# Patient Record
Sex: Female | Born: 1960 | Race: White | Hispanic: No | Smoking: Former smoker
Health system: Southern US, Community
[De-identification: ages and names within clinical notes are randomized; demographics above are authoritative.]

## PROBLEM LIST (undated history)

## (undated) ENCOUNTER — Emergency Department (HOSPITAL_COMMUNITY): Admission: EM | Payer: Self-pay

## (undated) DIAGNOSIS — I10 Essential (primary) hypertension: Secondary | ICD-10-CM

## (undated) DIAGNOSIS — K56609 Unspecified intestinal obstruction, unspecified as to partial versus complete obstruction: Secondary | ICD-10-CM

## (undated) DIAGNOSIS — F1190 Opioid use, unspecified, uncomplicated: Secondary | ICD-10-CM

## (undated) DIAGNOSIS — E039 Hypothyroidism, unspecified: Secondary | ICD-10-CM

## (undated) DIAGNOSIS — M1990 Unspecified osteoarthritis, unspecified site: Secondary | ICD-10-CM

## (undated) DIAGNOSIS — K08109 Complete loss of teeth, unspecified cause, unspecified class: Secondary | ICD-10-CM

## (undated) DIAGNOSIS — M869 Osteomyelitis, unspecified: Secondary | ICD-10-CM

## (undated) DIAGNOSIS — E119 Type 2 diabetes mellitus without complications: Secondary | ICD-10-CM

## (undated) DIAGNOSIS — M0600 Rheumatoid arthritis without rheumatoid factor, unspecified site: Secondary | ICD-10-CM

## (undated) DIAGNOSIS — E079 Disorder of thyroid, unspecified: Secondary | ICD-10-CM

## (undated) DIAGNOSIS — Z8659 Personal history of other mental and behavioral disorders: Secondary | ICD-10-CM

## (undated) DIAGNOSIS — Z972 Presence of dental prosthetic device (complete) (partial): Secondary | ICD-10-CM

## (undated) DIAGNOSIS — G629 Polyneuropathy, unspecified: Secondary | ICD-10-CM

## (undated) DIAGNOSIS — G8929 Other chronic pain: Secondary | ICD-10-CM

## (undated) DIAGNOSIS — K7460 Unspecified cirrhosis of liver: Secondary | ICD-10-CM

## (undated) DIAGNOSIS — F1410 Cocaine abuse, uncomplicated: Secondary | ICD-10-CM

## (undated) DIAGNOSIS — F1210 Cannabis abuse, uncomplicated: Secondary | ICD-10-CM

## (undated) HISTORY — PX: TONSILLECTOMY: SUR1361

## (undated) HISTORY — DX: Type 2 diabetes mellitus without complications: E11.9

## (undated) HISTORY — PX: TOTAL KNEE ARTHROPLASTY: SHX125

## (undated) HISTORY — DX: Unspecified intestinal obstruction, unspecified as to partial versus complete obstruction: K56.609

## (undated) HISTORY — PX: JOINT REPLACEMENT: SHX530

## (undated) HISTORY — PX: ABDOMINAL HYSTERECTOMY: SHX81

## (undated) HISTORY — DX: Unspecified cirrhosis of liver: K74.60

## (undated) HISTORY — PX: COLON SURGERY: SHX602

## (undated) HISTORY — PX: BREAST REDUCTION SURGERY: SHX8

## (undated) HISTORY — PX: SMALL INTESTINE SURGERY: SHX150

## (undated) HISTORY — PX: KNEE SURGERY: SHX244

---

## 1987-06-24 HISTORY — PX: BREAST REDUCTION SURGERY: SHX8

## 1998-05-25 ENCOUNTER — Inpatient Hospital Stay (HOSPITAL_COMMUNITY): Admission: AD | Admit: 1998-05-25 | Discharge: 1998-05-25 | Payer: Self-pay | Admitting: *Deleted

## 1998-06-07 ENCOUNTER — Ambulatory Visit (HOSPITAL_COMMUNITY): Admission: RE | Admit: 1998-06-07 | Discharge: 1998-06-07 | Payer: Self-pay | Admitting: Obstetrics

## 1998-06-07 ENCOUNTER — Encounter: Admission: RE | Admit: 1998-06-07 | Discharge: 1998-06-07 | Payer: Self-pay | Admitting: Obstetrics

## 1998-06-07 ENCOUNTER — Encounter: Payer: Self-pay | Admitting: Obstetrics

## 1998-06-20 ENCOUNTER — Encounter: Admission: RE | Admit: 1998-06-20 | Discharge: 1998-06-20 | Payer: Self-pay | Admitting: Obstetrics & Gynecology

## 1998-06-20 ENCOUNTER — Ambulatory Visit (HOSPITAL_COMMUNITY): Admission: RE | Admit: 1998-06-20 | Discharge: 1998-06-20 | Payer: Self-pay | Admitting: Obstetrics & Gynecology

## 1998-06-20 ENCOUNTER — Encounter: Payer: Self-pay | Admitting: Obstetrics & Gynecology

## 1998-06-26 ENCOUNTER — Ambulatory Visit (HOSPITAL_COMMUNITY): Admission: RE | Admit: 1998-06-26 | Discharge: 1998-06-26 | Payer: Self-pay | Admitting: Obstetrics & Gynecology

## 1998-06-26 ENCOUNTER — Encounter: Admission: RE | Admit: 1998-06-26 | Discharge: 1998-06-26 | Payer: Self-pay | Admitting: Obstetrics & Gynecology

## 1998-06-27 ENCOUNTER — Ambulatory Visit (HOSPITAL_COMMUNITY): Admission: RE | Admit: 1998-06-27 | Discharge: 1998-06-27 | Payer: Self-pay | Admitting: Obstetrics & Gynecology

## 1998-06-27 ENCOUNTER — Encounter: Payer: Self-pay | Admitting: Obstetrics & Gynecology

## 1999-01-30 ENCOUNTER — Encounter: Admission: RE | Admit: 1999-01-30 | Discharge: 1999-04-30 | Payer: Self-pay | Admitting: Family Medicine

## 2003-06-24 HISTORY — PX: TOTAL VAGINAL HYSTERECTOMY: SHX2548

## 2004-06-23 DIAGNOSIS — Z8719 Personal history of other diseases of the digestive system: Secondary | ICD-10-CM

## 2004-06-23 HISTORY — DX: Personal history of other diseases of the digestive system: Z87.19

## 2004-06-23 HISTORY — PX: COLON SURGERY: SHX602

## 2004-08-17 ENCOUNTER — Ambulatory Visit: Payer: Self-pay | Admitting: Internal Medicine

## 2005-01-10 ENCOUNTER — Emergency Department: Payer: Self-pay | Admitting: Emergency Medicine

## 2005-01-13 ENCOUNTER — Ambulatory Visit: Payer: Self-pay | Admitting: Emergency Medicine

## 2005-01-24 ENCOUNTER — Ambulatory Visit: Payer: Self-pay | Admitting: Unknown Physician Specialty

## 2005-04-09 ENCOUNTER — Ambulatory Visit: Payer: Self-pay | Admitting: Family Medicine

## 2005-04-15 ENCOUNTER — Ambulatory Visit: Payer: Self-pay | Admitting: Family Medicine

## 2006-08-26 ENCOUNTER — Ambulatory Visit: Payer: Self-pay | Admitting: Family Medicine

## 2006-08-31 ENCOUNTER — Ambulatory Visit: Payer: Self-pay | Admitting: Family Medicine

## 2006-08-31 LAB — CONVERTED CEMR LAB
Basophils Absolute: 0 10*3/uL (ref 0.0–0.1)
Eosinophils Absolute: 0.1 10*3/uL (ref 0.0–0.6)
Eosinophils Relative: 1.6 % (ref 0.0–5.0)
Lymphocytes Relative: 22.9 % (ref 12.0–46.0)
MCV: 102.7 fL — ABNORMAL HIGH (ref 78.0–100.0)
Monocytes Relative: 4.1 % (ref 3.0–11.0)
Neutro Abs: 5.2 10*3/uL (ref 1.4–7.7)
Platelets: 194 10*3/uL (ref 150–400)
RBC: 3.72 M/uL — ABNORMAL LOW (ref 3.87–5.11)
WBC: 7.3 10*3/uL (ref 4.5–10.5)

## 2007-06-30 ENCOUNTER — Ambulatory Visit: Payer: Self-pay | Admitting: Family Medicine

## 2007-06-30 DIAGNOSIS — R062 Wheezing: Secondary | ICD-10-CM

## 2008-06-23 DIAGNOSIS — Z8711 Personal history of peptic ulcer disease: Secondary | ICD-10-CM

## 2008-06-23 DIAGNOSIS — K7460 Unspecified cirrhosis of liver: Secondary | ICD-10-CM

## 2008-06-23 HISTORY — DX: Unspecified cirrhosis of liver: K74.60

## 2008-06-23 HISTORY — DX: Personal history of peptic ulcer disease: Z87.11

## 2008-12-22 ENCOUNTER — Emergency Department (HOSPITAL_COMMUNITY): Admission: EM | Admit: 2008-12-22 | Discharge: 2008-12-22 | Payer: Self-pay | Admitting: Family Medicine

## 2009-01-27 ENCOUNTER — Inpatient Hospital Stay (HOSPITAL_COMMUNITY): Admission: EM | Admit: 2009-01-27 | Discharge: 2009-01-31 | Payer: Self-pay | Admitting: Emergency Medicine

## 2009-01-30 ENCOUNTER — Encounter (INDEPENDENT_AMBULATORY_CARE_PROVIDER_SITE_OTHER): Payer: Self-pay | Admitting: Emergency Medicine

## 2009-01-30 ENCOUNTER — Ambulatory Visit: Payer: Self-pay | Admitting: Gastroenterology

## 2009-01-30 ENCOUNTER — Telehealth (INDEPENDENT_AMBULATORY_CARE_PROVIDER_SITE_OTHER): Payer: Self-pay | Admitting: *Deleted

## 2009-01-30 DIAGNOSIS — R933 Abnormal findings on diagnostic imaging of other parts of digestive tract: Secondary | ICD-10-CM

## 2009-01-30 HISTORY — PX: ESOPHAGOGASTRODUODENOSCOPY: SHX1529

## 2009-02-27 ENCOUNTER — Ambulatory Visit (HOSPITAL_COMMUNITY): Admission: RE | Admit: 2009-02-27 | Discharge: 2009-02-27 | Payer: Self-pay | Admitting: Gastroenterology

## 2009-03-12 ENCOUNTER — Ambulatory Visit: Payer: Self-pay | Admitting: Family Medicine

## 2009-04-03 ENCOUNTER — Telehealth (INDEPENDENT_AMBULATORY_CARE_PROVIDER_SITE_OTHER): Payer: Self-pay | Admitting: *Deleted

## 2009-04-30 ENCOUNTER — Ambulatory Visit: Payer: Self-pay | Admitting: Internal Medicine

## 2009-04-30 ENCOUNTER — Encounter: Payer: Self-pay | Admitting: Family Medicine

## 2009-04-30 LAB — CONVERTED CEMR LAB
ALT: 25 units/L (ref 0–35)
AST: 51 units/L — ABNORMAL HIGH (ref 0–37)
Barbiturate Quant, Ur: NEGATIVE
Benzodiazepines.: POSITIVE — AB
CO2: 25 meq/L (ref 19–32)
Calcium: 8.9 mg/dL (ref 8.4–10.5)
Chloride: 100 meq/L (ref 96–112)
Cholesterol: 180 mg/dL (ref 0–200)
Cocaine Metabolites: NEGATIVE
Creatinine, Ser: 0.78 mg/dL (ref 0.40–1.20)
Creatinine,U: 148.5 mg/dL
Eosinophils Absolute: 0.1 10*3/uL (ref 0.0–0.7)
Lymphocytes Relative: 15 % (ref 12–46)
Lymphs Abs: 1.9 10*3/uL (ref 0.7–4.0)
MCV: 103.6 fL — ABNORMAL HIGH (ref 78.0–100.0)
Methadone: NEGATIVE
Monocytes Relative: 4 % (ref 3–12)
Neutro Abs: 9.9 10*3/uL — ABNORMAL HIGH (ref 1.7–7.7)
Neutrophils Relative %: 80 % — ABNORMAL HIGH (ref 43–77)
Platelets: 181 10*3/uL (ref 150–400)
Potassium: 3.8 meq/L (ref 3.5–5.3)
RBC: 4.42 M/uL (ref 3.87–5.11)
Sodium: 139 meq/L (ref 135–145)
TSH: 0.797 microintl units/mL (ref 0.350–4.500)
Total CHOL/HDL Ratio: 7.2
Total Protein: 6.9 g/dL (ref 6.0–8.3)
Vit D, 25-Hydroxy: 8 ng/mL — ABNORMAL LOW (ref 30–89)
Vitamin B-12: 1173 pg/mL — ABNORMAL HIGH (ref 211–911)
WBC: 12.4 10*3/uL — ABNORMAL HIGH (ref 4.0–10.5)

## 2009-05-07 ENCOUNTER — Ambulatory Visit: Payer: Self-pay | Admitting: Internal Medicine

## 2010-09-28 LAB — COMPREHENSIVE METABOLIC PANEL WITH GFR
ALT: 17 U/L (ref 0–35)
AST: 69 U/L — ABNORMAL HIGH (ref 0–37)
Albumin: 3.8 g/dL (ref 3.5–5.2)
Alkaline Phosphatase: 144 U/L — ABNORMAL HIGH (ref 39–117)
BUN: 13 mg/dL (ref 6–23)
CO2: 30 meq/L (ref 19–32)
Calcium: 9.6 mg/dL (ref 8.4–10.5)
Chloride: 87 meq/L — ABNORMAL LOW (ref 96–112)
Creatinine, Ser: 0.96 mg/dL (ref 0.4–1.2)
GFR calc non Af Amer: >60 mL/min
Glucose, Bld: 132 mg/dL — ABNORMAL HIGH (ref 70–99)
Potassium: 3.2 meq/L — ABNORMAL LOW (ref 3.5–5.1)
Sodium: 134 meq/L — ABNORMAL LOW (ref 135–145)
Total Bilirubin: 2.7 mg/dL — ABNORMAL HIGH (ref 0.3–1.2)
Total Protein: 6.9 g/dL (ref 6.0–8.3)

## 2010-09-28 LAB — URINE MICROSCOPIC-ADD ON

## 2010-09-28 LAB — COMPREHENSIVE METABOLIC PANEL
AST: 44 U/L — ABNORMAL HIGH (ref 0–37)
AST: 49 U/L — ABNORMAL HIGH (ref 0–37)
Albumin: 3.2 g/dL — ABNORMAL LOW (ref 3.5–5.2)
Albumin: 3.2 g/dL — ABNORMAL LOW (ref 3.5–5.2)
Albumin: 3.8 g/dL (ref 3.5–5.2)
Alkaline Phosphatase: 123 U/L — ABNORMAL HIGH (ref 39–117)
BUN: 13 mg/dL (ref 6–23)
BUN: 2 mg/dL — ABNORMAL LOW (ref 6–23)
BUN: 5 mg/dL — ABNORMAL LOW (ref 6–23)
Calcium: 8.6 mg/dL (ref 8.4–10.5)
Calcium: 8.9 mg/dL (ref 8.4–10.5)
Chloride: 101 mEq/L (ref 96–112)
Chloride: 88 mEq/L — ABNORMAL LOW (ref 96–112)
Creatinine, Ser: 0.78 mg/dL (ref 0.4–1.2)
Creatinine, Ser: 0.79 mg/dL (ref 0.4–1.2)
Creatinine, Ser: 0.86 mg/dL (ref 0.4–1.2)
GFR calc Af Amer: >60 mL/min (ref >60)
GFR calc Af Amer: >60 mL/min (ref >60)
GFR calc non Af Amer: >60 mL/min (ref >60)
Glucose, Bld: 167 mg/dL — ABNORMAL HIGH (ref 70–99)
Potassium: 2.9 mEq/L — ABNORMAL LOW (ref 3.5–5.1)
Total Bilirubin: 1.1 mg/dL (ref 0.3–1.2)
Total Bilirubin: 2.1 mg/dL — ABNORMAL HIGH (ref 0.3–1.2)
Total Protein: 5.6 g/dL — ABNORMAL LOW (ref 6.0–8.3)
Total Protein: 5.8 g/dL — ABNORMAL LOW (ref 6.0–8.3)

## 2010-09-28 LAB — HEPATIC FUNCTION PANEL
ALT: 14 U/L (ref 0–35)
AST: 56 U/L — ABNORMAL HIGH (ref 0–37)
Albumin: 3.5 g/dL (ref 3.5–5.2)
Alkaline Phosphatase: 121 U/L — ABNORMAL HIGH (ref 39–117)
Bilirubin, Direct: 0.8 mg/dL — ABNORMAL HIGH (ref 0.0–0.3)
Indirect Bilirubin: 1.7 mg/dL — ABNORMAL HIGH (ref 0.3–0.9)
Total Bilirubin: 2.5 mg/dL — ABNORMAL HIGH (ref 0.3–1.2)
Total Protein: 6.2 g/dL (ref 6.0–8.3)

## 2010-09-28 LAB — URINE CULTURE

## 2010-09-28 LAB — CBC
HCT: 26.1 % — ABNORMAL LOW (ref 36.0–46.0)
HCT: 26.4 % — ABNORMAL LOW (ref 36.0–46.0)
HCT: 30.7 % — ABNORMAL LOW (ref 36.0–46.0)
HCT: 35.6 % — ABNORMAL LOW (ref 36.0–46.0)
Hemoglobin: 10.3 g/dL — ABNORMAL LOW (ref 12.0–15.0)
Hemoglobin: 12.2 g/dL (ref 12.0–15.0)
MCHC: 33.4 g/dL (ref 30.0–36.0)
MCHC: 33.6 g/dL (ref 30.0–36.0)
MCHC: 34.3 g/dL (ref 30.0–36.0)
MCV: 103.3 fL — ABNORMAL HIGH (ref 78.0–100.0)
MCV: 105.1 fL — ABNORMAL HIGH (ref 78.0–100.0)
MCV: 105.9 fL — ABNORMAL HIGH (ref 78.0–100.0)
MCV: 106 fL — ABNORMAL HIGH (ref 78.0–100.0)
Platelets: 102 10*3/uL — ABNORMAL LOW (ref 150–400)
Platelets: 125 10*3/uL — ABNORMAL LOW (ref 150–400)
Platelets: 133 10*3/uL — ABNORMAL LOW (ref 150–400)
Platelets: 144 10*3/uL — ABNORMAL LOW (ref 150–400)
RBC: 3.45 MIL/uL — ABNORMAL LOW (ref 3.87–5.11)
RDW: 17 % — ABNORMAL HIGH (ref 11.5–15.5)
RDW: 17.3 % — ABNORMAL HIGH (ref 11.5–15.5)
RDW: 17.4 % — ABNORMAL HIGH (ref 11.5–15.5)
RDW: 17.8 % — ABNORMAL HIGH (ref 11.5–15.5)
WBC: 8.2 10*3/uL (ref 4.0–10.5)
WBC: 8.3 10*3/uL (ref 4.0–10.5)
WBC: 8.9 10*3/uL (ref 4.0–10.5)

## 2010-09-28 LAB — LIPID PANEL
Cholesterol: 202 mg/dL — ABNORMAL HIGH (ref 0–200)
HDL: 59 mg/dL (ref >39)
HDL: 65 mg/dL (ref >39)
LDL Cholesterol: 91 mg/dL (ref 0–99)
Total CHOL/HDL Ratio: 3.1 RATIO
Total CHOL/HDL Ratio: 3.4 RATIO

## 2010-09-28 LAB — URINALYSIS, ROUTINE W REFLEX MICROSCOPIC
Glucose, UA: NEGATIVE mg/dL
Hgb urine dipstick: NEGATIVE
Hgb urine dipstick: NEGATIVE
Hgb urine dipstick: NEGATIVE
Nitrite: NEGATIVE
Nitrite: POSITIVE — AB
Protein, ur: NEGATIVE mg/dL
Specific Gravity, Urine: 1.011 (ref 1.005–1.030)
Specific Gravity, Urine: 1.022 (ref 1.005–1.030)
Urobilinogen, UA: 4 mg/dL — ABNORMAL HIGH (ref 0.0–1.0)
Urobilinogen, UA: >8 mg/dL — ABNORMAL HIGH (ref 0.0–1.0)

## 2010-09-28 LAB — DIFFERENTIAL
Basophils Absolute: 0 10*3/uL (ref 0.0–0.1)
Lymphocytes Relative: 13 % (ref 12–46)
Lymphs Abs: 1.2 10*3/uL (ref 0.7–4.0)
Monocytes Absolute: 0.4 10*3/uL (ref 0.1–1.0)
Monocytes Relative: 5 % (ref 3–12)
Neutro Abs: 7.2 10*3/uL (ref 1.7–7.7)

## 2010-09-28 LAB — HEMOCCULT GUIAC POC 1CARD (OFFICE): Fecal Occult Bld: POSITIVE

## 2010-09-28 LAB — HEPATITIS PANEL, ACUTE
HCV Ab: NEGATIVE
Hep A IgM: NEGATIVE
Hepatitis B Surface Ag: NEGATIVE

## 2010-09-28 LAB — CK TOTAL AND CKMB (NOT AT ARMC)
CK, MB: 1.3 ng/mL (ref 0.3–4.0)
Relative Index: INVALID (ref 0.0–2.5)
Total CK: 46 U/L (ref 7–177)

## 2010-09-28 LAB — AFP TUMOR MARKER: AFP-Tumor Marker: 11.5 ng/mL — ABNORMAL HIGH (ref 0.0–8.0)

## 2010-09-28 LAB — HEMOGLOBIN A1C
Hgb A1c MFr Bld: 6.9 % — ABNORMAL HIGH (ref 4.6–6.1)
Mean Plasma Glucose: 151 mg/dL

## 2010-09-28 LAB — FOLATE
Folate: 1.1 ng/mL
Folate: >20 ng/mL

## 2010-09-28 LAB — TROPONIN I

## 2010-09-28 LAB — RETICULOCYTES
RBC.: 2.81 MIL/uL — ABNORMAL LOW (ref 3.87–5.11)
Retic Ct Pct: 0.5 % (ref 0.4–3.1)

## 2010-09-28 LAB — POCT CARDIAC MARKERS
CKMB, poc: <1 ng/mL — ABNORMAL LOW (ref 1.0–8.0)
Myoglobin, poc: 92.4 ng/mL (ref 12–200)
Troponin i, poc: <0.05 ng/mL (ref 0.00–0.09)

## 2010-09-28 LAB — VITAMIN B12
Vitamin B-12: 1173 pg/mL — ABNORMAL HIGH (ref 211–911)
Vitamin B-12: 771 pg/mL (ref 211–911)

## 2010-09-28 LAB — PROTIME-INR
INR: 1.1 (ref 0.00–1.49)
Prothrombin Time: 14.2 s (ref 11.6–15.2)

## 2010-09-28 LAB — T4, FREE: Free T4: 1.09 ng/dL (ref 0.80–1.80)

## 2010-09-28 LAB — CARDIAC PANEL(CRET KIN+CKTOT+MB+TROPI)
Relative Index: INVALID (ref 0.0–2.5)
Total CK: 57 U/L (ref 7–177)
Troponin I: 0.01 ng/mL (ref 0.00–0.06)

## 2010-09-28 LAB — IRON AND TIBC: UIBC: 169 ug/dL

## 2010-09-28 LAB — GLUCOSE, CAPILLARY: Glucose-Capillary: 144 mg/dL — ABNORMAL HIGH (ref 70–99)

## 2010-09-28 LAB — APTT: aPTT: 27 seconds (ref 24–37)

## 2010-09-28 LAB — CULTURE, ASPIRATE-QUANTITATIVE

## 2010-09-28 LAB — BRAIN NATRIURETIC PEPTIDE: Pro B Natriuretic peptide (BNP): <30 pg/mL (ref 0.0–100.0)

## 2010-09-28 LAB — TSH: TSH: 16.392 u[IU]/mL — ABNORMAL HIGH (ref 0.350–4.500)

## 2010-11-05 NOTE — Discharge Summary (Signed)
Taylor Benitez, HOPES                ACCOUNT NO.:  [id number]   MEDICAL RECORD NO.:  [id number]          PATIENT TYPE:  INP   LOCATION:  5504                         FACILITY:  [facility]   PHYSICIAN:  Herbie Saxon, MDDATE OF BIRTH:  23-Nov-1960   DATE OF ADMISSION:  01/26/2009  DATE OF DISCHARGE:  01/31/2009                               DISCHARGE SUMMARY   DISCHARGE DIAGNOSES:  1. Chest pain, noncardiac.  2. Alcoholic liver disease.  3. Query hepatocellular carcinoma.  4. History of alcohol abuse.  5. Microcytic anemia.  6. Frequent falls, unsteady gait.  7. Occult gastrointestinal bleed.  8. Mild portal gastropathy.  9. Prepyloric ulcers.  10.Hypertension, stable.  11.Hypothyroidism.  12.Fatty liver.  13.Elevated AST.   CONSULTS:  Gastroenterology, Dr. Christella Hartigan.   PROCEDURE:  Upper endoscopy on January 30, 2009, showed mild portal  gastropathy and prepyloric ulcers.   RADIOLOGY:  The CT abdomen of January 27, 2009, shows hepatomegaly, fatty  infiltration, and hyperenhancement in the inferior right liver, query  hepatocellular carcinoma.  Abdomen ultrasound of January 27, 2009, showed  enlarged echogenic liver suggesting fatty infiltration.  Chest x-ray of  January 26, 2009, showed no acute findings.   HOSPITAL COURSE:  This 50 year old Caucasian lady presented to the  emergency room with complaint of shortness of breath and chest pressure.  The patient does admit to drinking 3 pints of bourbon on a daily basis.  On this admission, a serial cardiac enzymes and EKG were negative for  acute coronary event.  The patient was noticed to have hemoccult  positive necessitating Gastroenterology evaluation.  The upper endoscopy  findings did reveal peptic ulcer disease.  The patient has been advised  total abstinence with alcohol and to register with AA, [facility], for alcohol rehab.  Because of the abnormal findings on the  CT abdomen, the patient has been scheduled for an  MRI of the liver in  the 3-4 weeks by Gastroenterology.  The patient will also have a repeat  endoscopy in the next 2 months by Gastroenterology.  The patient was  noticed to have an steady gait, frequent falls and she is going to be  evaluated by Physical Therapy prior to being discharge home.  We will  follow Physical Therapy recommendations.   DISCHARGE CONDITION:  Stable.   DIET:  Heart-healthy, low cholesterol.   ACTIVITY:  Increased slowly as tolerated, ambulating with assistance.  The patient to follow up with her primary care physician at [facility], Internal Medicine in the next 1-2 weeks.  The patient to follow up  with Dr. Christella Hartigan, gastroenterologist, in the next 3-4 weeks.   MEDICATIONS ON DISCHARGE:  1. Synthroid 200 mcg daily.  2. Folic acid 1 mg daily.  3. Thiamine 100 mg daily.  4. Xanax 0.5 mg b.i.d.  5. Protonix 40 mg daily.  6. Oxycodone 5 mg q.6 h. p.r.n. for moderate pain.  7. Hematin Plus 1 tablet daily.   PHYSICAL EXAMINATION:  GENERAL:  Today, she is a middle-aged lady,  obese, not in acute respiratory distress.  She is pale, not jaundiced.  HEENT:  Head is atraumatic and normoactive.  Pupils are equal, reactive  to light and accommodation.  Mucous membranes are moist.  Oropharynx and  nasopharynx are clear.  NECK:  Supple.  No thyromegaly.  No carotid bruit.  No lymphadenopathy.  CHEST:  Clinically clear.  No rales or rhonchi.  HEART:  Heart sounds 1 and 2.  Regular rate and rhythm.  No murmurs,  rubs, gallops, or clicks.  ABDOMEN:  Soft, truncal obesity.  No organomegaly.  Bowel sounds  present.  No ventral or inguinal hernia.  CNS:  She is alert and oriented to time, place and person.  No focal or  neurologic deficits.  Peripheral pulses present.  No pedal edema.   LABORATORY DATA:  WBC is 8, hematocrit 26, platelet count is 125, and  MCV 105.  Chemistry, sodium is 136, potassium 4.6, chloride 101,  bicarbonate 29, BUN is 2, creatinine 0.7,  glucose is 100, AST is 49, and  ALT 15.  The patient to have LFTs and CBC monitored by her primary care  physician as outpatient in the next 1-2 weeks.   DISCHARGE PLAN:  Discussed with the patient.  She verbalizes  understanding.   ENCOUNTER TIME:  Greater than 40 minutes.      Herbie Saxon, MD  Electronically Signed     MIO/MEDQ  D:  01/31/2009  T:  01/31/2009  Job:  [id number]   cc:   Rachael Fee, MD  [facility]

## 2010-11-05 NOTE — H&P (Signed)
Taylor Benitez, PERRET NO.:  [id number]   MEDICAL RECORD NO.:  [id number]          PATIENT TYPE:  INP   LOCATION:  3731                         FACILITY:  [facility]   PHYSICIAN:  Vania Rea, M.D. DATE OF BIRTH:  03/11/1961   DATE OF ADMISSION:  01/27/2009  DATE OF DISCHARGE:                              HISTORY & PHYSICAL   PRIMARY CARE PHYSICIAN:  [facility] at [facility].   CHIEF COMPLAINT:  Shortness of breath and chest pressure for the past  few days.   HISTORY OF PRESENT ILLNESS:  This is a 50 year old Caucasian lady with a  history of chronic ill health according to her daughter who is  accompanying her.  The patient has a remote history of colonic abscess  related to a ruptured diverticulitis and had surgery for that some years  ago at [facility], and since then has been followed up at [facility] for her general medical problems.  According  to daughter and the patient, she conceals from them the fact that she is  actually a very heavy drinker, drinks about 3 pints of bourbon per day  on average.  She says that when she does not drink she does not feel  well and her daughter says for the past year she has really been looking  quite sickly.  She has been having nausea and vomiting daily for the  past year.  For the past few weeks, she has been having difficulty with  her gait, feeling sick and nauseous.  For the past few days, she has  been short of breath and feels as if she has two cinder blocks on her  chest.  She has been having a cough productive of clear sputum.  She has  been having worsening abdominal pain aggravated by the coughing.  She  has been having increased stool frequency and since yesterday she has  noted that her urine has been getting progressively more dark.  She now  just does not feel well.  She recognizes that she needs to stop  drinking.   She takes Motrin about once per week  for her pains.  She passes black  stool, but she thinks it is related to Pepto-Bismol which she takes for  her stomach problems.  She has had no syncope.  She has had no lower  extremity edema.  She has had no abdominal swelling.  She has never  noticed herself jaundiced.  She feels she has chronic episodic fevers  ranging between 99-100, going on for over a year.   PAST MEDICAL HISTORY:  1. Hypertension.  2. Hypothyroidism.  3. Remote history of acute renal insufficiency.   PAST SURGICAL HISTORY:  1. Colonic resection.  2. Hysterectomy.   MEDICATIONS:  1. Lisinopril 10 mg daily.  2. Xanax 0.5 mg twice daily.  3. Synthroid 150 mg daily.  4. She no longer takes folic acid.  5. She no longer takes nortriptyline.  6. She no longer takes amlodipine.   ALLERGIES:  1. PENICILLIN CAUSES A RASH.  2. CODEINE CAUSES EXTREME NAUSEA.   She denies any allergy to oxycodone and says she has taken Percocet  without any problems.   SOCIAL HISTORY:  She smokes a three-quarter pack of cigarettes per day.  Alcohol use as noted above.  She denies any illicit drug use.  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   FAMILY HISTORY:  Significant for a grandfather with alcohol addiction.  Both parents are diabetic.  Her father has cardiac dysrhythmia, status  post pacemaker and also suffers with Parkinson's disease.   REVIEW OF SYSTEMS:  On a 10-point review of systems other than noted  above significant only for chronic rosacea for at least 7 years, which  she says runs in her family members.   PHYSICAL EXAMINATION:  GENERAL:  Ill-looking and somewhat mentally  confused middle-aged Caucasian lady sitting up on the stretcher.  VITAL SIGNS:  Her temperature is 98.6, her pulse is 93, respirations 20,  blood pressure 126/82.  She is saturating at 99% on room air.  HEENT:  Pupils are round and equal.  Mucous membranes are pink.  She is  slightly icteric, mildly  dehydrated.  NECK:  No cervical lymphadenopathy or thyromegaly.  No jugulovenous  distention.  CHEST:  Clear to auscultation bilaterally.  CARDIOVASCULAR:  Regular rhythm, no murmur heard.  ABDOMEN:  Soft and distended.  She has a tender hepatic mass, tender  hepatomegaly in the right upper quadrant, unclear if it is a mass  arising from the liver or the gallbladder.  It extends about 5  fingerbreadths below the costal margin.  There is no obvious ascites,  but she professes diffuse abdominal soreness.  EXTREMITIES:  Without edema.  She has 2+ dorsalis pedis pulses  bilaterally.  SKIN:  Dry and scaly for the most part, but she does have marked rosacea  of her face involving the cheeks, forehead and neck and some erythema of  the chest.  She does not have marked palmar erythema.  No dilated veins  are noted in the abdomen.  CENTRAL NERVOUS SYSTEM:  Cranial nerves, II-XII are grossly intact.  No  focal lateralizing signs are noted.  Her gait was not tested.   LABORATORY DATA:  Her white count is 8.9, hemoglobin 12.2, MCV 103.3,  platelets 144.  She has 82% neutrophils.  Her absolute neutrophil count  is normal at 7.2.  Her complete metabolic panel significant for sodium  of 134, potassium 3.2, chloride 87, CO2 of 30, glucose 132, BUN 13,  creatinine 0.96, total bilirubin is 2.7, alkaline phosphatase elevated  at 144, AST elevated at 69, ALT 17, total protein 6.9, albumin 3.8,  calcium 9.6.  Beta natriuretic peptide is undetectable.  Venous lactic  acid is slightly elevated at 3.7.  Urinalysis:  Specific gravity is  1.024, moderate amount of bilirubin, urobilinogen greater than 8,  nitrite positive, leukocyte esterase small.  Urine microscopy shows  white cells 3-6, rare bacteria.  Cardiac markers show undetectable CK-MB  and troponins and myoglobin 9.2.  Chest x-ray shows no acute findings.  EKG showed sinus rhythm with a prolonged QT and nonspecific T-wave  abnormalities throughout all  leads.   ASSESSMENT:  1. Chest pain of unclear etiology.  2. Deranged liver function, likely due to chronic liver disease,      possibly gallbladder disease.  3. Liver mass versus enlarged liver.  4. Hyponatremia and hypokalemia related to chronic vomiting related to  chronic liver disease.  5. Chronic alcoholism causing the above.  6. Rosacea.   PLAN:  1. We will get an ultrasound of the abdomen.  2. We will hydrate this lady cautiously and will correct electrolyte      abnormalities.  We will get serial cardiac enzymes and she will      likely benefit from cardiac workup.  We will put her on an alcohol      withdrawal protocol.  3. We will check serum folate and B12.   Other plans depending on the results of subsequent studies.      Vania Rea, M.D.  Electronically Signed     LC/MEDQ  D:  01/27/2009  T:  01/27/2009  Job:  [id number]   cc:   [facility]

## 2011-04-01 ENCOUNTER — Ambulatory Visit: Payer: Self-pay | Admitting: Internal Medicine

## 2011-05-07 ENCOUNTER — Ambulatory Visit: Payer: Self-pay | Admitting: Internal Medicine

## 2011-06-12 ENCOUNTER — Emergency Department: Payer: Self-pay | Admitting: Emergency Medicine

## 2011-08-26 ENCOUNTER — Ambulatory Visit: Payer: Self-pay | Admitting: Internal Medicine

## 2013-11-16 DIAGNOSIS — G629 Polyneuropathy, unspecified: Secondary | ICD-10-CM | POA: Insufficient documentation

## 2013-11-16 DIAGNOSIS — M0600 Rheumatoid arthritis without rheumatoid factor, unspecified site: Secondary | ICD-10-CM | POA: Insufficient documentation

## 2013-11-16 DIAGNOSIS — M1712 Unilateral primary osteoarthritis, left knee: Secondary | ICD-10-CM | POA: Insufficient documentation

## 2013-11-29 DIAGNOSIS — F410 Panic disorder [episodic paroxysmal anxiety] without agoraphobia: Secondary | ICD-10-CM | POA: Insufficient documentation

## 2014-03-21 DIAGNOSIS — G4700 Insomnia, unspecified: Secondary | ICD-10-CM | POA: Insufficient documentation

## 2014-04-19 DIAGNOSIS — G8929 Other chronic pain: Secondary | ICD-10-CM | POA: Insufficient documentation

## 2015-01-09 DIAGNOSIS — I10 Essential (primary) hypertension: Secondary | ICD-10-CM | POA: Insufficient documentation

## 2015-06-24 DIAGNOSIS — Z9189 Other specified personal risk factors, not elsewhere classified: Secondary | ICD-10-CM

## 2015-06-24 HISTORY — DX: Other specified personal risk factors, not elsewhere classified: Z91.89

## 2015-12-09 ENCOUNTER — Observation Stay (HOSPITAL_COMMUNITY)
Admission: EM | Admit: 2015-12-09 | Discharge: 2015-12-10 | Disposition: A | Discharge status: Home / Self Care | Payer: Medicare Other | Attending: Internal Medicine | Admitting: Internal Medicine

## 2015-12-09 ENCOUNTER — Emergency Department (HOSPITAL_COMMUNITY): Payer: Medicare Other

## 2015-12-09 ENCOUNTER — Encounter (HOSPITAL_COMMUNITY): Payer: Self-pay | Admitting: *Deleted

## 2015-12-09 ENCOUNTER — Observation Stay (HOSPITAL_COMMUNITY): Payer: Medicare Other

## 2015-12-09 ENCOUNTER — Other Ambulatory Visit: Payer: Self-pay

## 2015-12-09 DIAGNOSIS — Z9181 History of falling: Secondary | ICD-10-CM | POA: Diagnosis not present

## 2015-12-09 DIAGNOSIS — F1120 Opioid dependence, uncomplicated: Secondary | ICD-10-CM | POA: Insufficient documentation

## 2015-12-09 DIAGNOSIS — R4182 Altered mental status, unspecified: Secondary | ICD-10-CM | POA: Diagnosis present

## 2015-12-09 DIAGNOSIS — T424X1A Poisoning by benzodiazepines, accidental (unintentional), initial encounter: Secondary | ICD-10-CM | POA: Diagnosis not present

## 2015-12-09 DIAGNOSIS — R296 Repeated falls: Secondary | ICD-10-CM | POA: Insufficient documentation

## 2015-12-09 DIAGNOSIS — T402X1A Poisoning by other opioids, accidental (unintentional), initial encounter: Secondary | ICD-10-CM | POA: Diagnosis not present

## 2015-12-09 DIAGNOSIS — F17200 Nicotine dependence, unspecified, uncomplicated: Secondary | ICD-10-CM | POA: Diagnosis not present

## 2015-12-09 DIAGNOSIS — E1165 Type 2 diabetes mellitus with hyperglycemia: Secondary | ICD-10-CM | POA: Diagnosis not present

## 2015-12-09 DIAGNOSIS — E119 Type 2 diabetes mellitus without complications: Secondary | ICD-10-CM

## 2015-12-09 DIAGNOSIS — Z79899 Other long term (current) drug therapy: Secondary | ICD-10-CM | POA: Insufficient documentation

## 2015-12-09 DIAGNOSIS — T50901A Poisoning by unspecified drugs, medicaments and biological substances, accidental (unintentional), initial encounter: Secondary | ICD-10-CM

## 2015-12-09 DIAGNOSIS — F329 Major depressive disorder, single episode, unspecified: Secondary | ICD-10-CM | POA: Diagnosis not present

## 2015-12-09 DIAGNOSIS — G929 Unspecified toxic encephalopathy: Secondary | ICD-10-CM | POA: Diagnosis present

## 2015-12-09 DIAGNOSIS — Z9189 Other specified personal risk factors, not elsewhere classified: Secondary | ICD-10-CM | POA: Insufficient documentation

## 2015-12-09 DIAGNOSIS — T40601A Poisoning by unspecified narcotics, accidental (unintentional), initial encounter: Secondary | ICD-10-CM | POA: Insufficient documentation

## 2015-12-09 DIAGNOSIS — M159 Polyosteoarthritis, unspecified: Secondary | ICD-10-CM | POA: Insufficient documentation

## 2015-12-09 DIAGNOSIS — M1990 Unspecified osteoarthritis, unspecified site: Secondary | ICD-10-CM

## 2015-12-09 DIAGNOSIS — E1142 Type 2 diabetes mellitus with diabetic polyneuropathy: Secondary | ICD-10-CM | POA: Insufficient documentation

## 2015-12-09 DIAGNOSIS — E079 Disorder of thyroid, unspecified: Secondary | ICD-10-CM | POA: Diagnosis not present

## 2015-12-09 DIAGNOSIS — I10 Essential (primary) hypertension: Secondary | ICD-10-CM | POA: Insufficient documentation

## 2015-12-09 DIAGNOSIS — G92 Toxic encephalopathy: Secondary | ICD-10-CM | POA: Insufficient documentation

## 2015-12-09 HISTORY — DX: Unspecified osteoarthritis, unspecified site: M19.90

## 2015-12-09 HISTORY — DX: Essential (primary) hypertension: I10

## 2015-12-09 HISTORY — DX: Disorder of thyroid, unspecified: E07.9

## 2015-12-09 LAB — URINALYSIS, ROUTINE W REFLEX MICROSCOPIC
BILIRUBIN URINE: NEGATIVE
Glucose, UA: NEGATIVE mg/dL
HGB URINE DIPSTICK: NEGATIVE
KETONES UR: NEGATIVE mg/dL
Nitrite: NEGATIVE
Protein, ur: NEGATIVE mg/dL
SPECIFIC GRAVITY, URINE: 1.015 (ref 1.005–1.030)
pH: 5.5 (ref 5.0–8.0)

## 2015-12-09 LAB — CBC WITH DIFFERENTIAL/PLATELET
Basophils Absolute: 0 10*3/uL (ref 0.0–0.1)
Basophils Relative: 0 %
EOS PCT: 1 %
Eosinophils Absolute: 0.1 10*3/uL (ref 0.0–0.7)
HEMATOCRIT: 38.5 % (ref 36.0–46.0)
Hemoglobin: 11.9 g/dL — ABNORMAL LOW (ref 12.0–15.0)
LYMPHS PCT: 17 %
Lymphs Abs: 1.5 10*3/uL (ref 0.7–4.0)
MCH: 26.4 pg (ref 26.0–34.0)
MCHC: 30.9 g/dL (ref 30.0–36.0)
MCV: 85.4 fL (ref 78.0–100.0)
MONO ABS: 0.3 10*3/uL (ref 0.1–1.0)
MONOS PCT: 4 %
NEUTROS ABS: 6.6 10*3/uL (ref 1.7–7.7)
Neutrophils Relative %: 78 %
PLATELETS: 171 10*3/uL (ref 150–400)
RBC: 4.51 MIL/uL (ref 3.87–5.11)
RDW: 16.5 % — AB (ref 11.5–15.5)
WBC: 8.5 10*3/uL (ref 4.0–10.5)

## 2015-12-09 LAB — COMPREHENSIVE METABOLIC PANEL
ALK PHOS: 97 U/L (ref 38–126)
ALT: 13 U/L — ABNORMAL LOW (ref 14–54)
ANION GAP: 11 (ref 5–15)
AST: 15 U/L (ref 15–41)
Albumin: 3.7 g/dL (ref 3.5–5.0)
BUN: 18 mg/dL (ref 6–20)
CO2: 24 mmol/L (ref 22–32)
CREATININE: 1.11 mg/dL — AB (ref 0.44–1.00)
Calcium: 9.2 mg/dL (ref 8.9–10.3)
Chloride: 101 mmol/L (ref 101–111)
GFR calc non Af Amer: 55 mL/min — ABNORMAL LOW (ref >60)
Glucose, Bld: 153 mg/dL — ABNORMAL HIGH (ref 65–99)
POTASSIUM: 3.4 mmol/L — AB (ref 3.5–5.1)
SODIUM: 136 mmol/L (ref 135–145)
Total Bilirubin: 1.2 mg/dL (ref 0.3–1.2)
Total Protein: 6.9 g/dL (ref 6.5–8.1)

## 2015-12-09 LAB — GLUCOSE, CAPILLARY
GLUCOSE-CAPILLARY: 140 mg/dL — AB (ref 65–99)
Glucose-Capillary: 126 mg/dL — ABNORMAL HIGH (ref 65–99)
Glucose-Capillary: 193 mg/dL — ABNORMAL HIGH (ref 65–99)

## 2015-12-09 LAB — URINE MICROSCOPIC-ADD ON

## 2015-12-09 LAB — SALICYLATE LEVEL: Salicylate Lvl: <4 mg/dL (ref 2.8–30.0)

## 2015-12-09 LAB — RAPID URINE DRUG SCREEN, HOSP PERFORMED
AMPHETAMINES: NOT DETECTED
BARBITURATES: NOT DETECTED
Benzodiazepines: POSITIVE — AB
Cocaine: NOT DETECTED
OPIATES: POSITIVE — AB
TETRAHYDROCANNABINOL: POSITIVE — AB

## 2015-12-09 LAB — CK: CK TOTAL: 59 U/L (ref 38–234)

## 2015-12-09 LAB — ACETAMINOPHEN LEVEL: Acetaminophen (Tylenol), Serum: <10 ug/mL — ABNORMAL LOW (ref 10–30)

## 2015-12-09 LAB — TSH: TSH: 1.254 u[IU]/mL (ref 0.350–4.500)

## 2015-12-09 LAB — ETHANOL

## 2015-12-09 MED ORDER — CHLORHEXIDINE GLUCONATE 0.12 % MT SOLN
15.0000 mL | Freq: Two times a day (BID) | OROMUCOSAL | Status: DC
  Administered 2015-12-09 – 2015-12-10 (×2): 15 mL via OROMUCOSAL
  Filled 2015-12-09 (×2): qty 15

## 2015-12-09 MED ORDER — SODIUM CHLORIDE 0.9 % IV SOLN
INTRAVENOUS | Status: DC
  Administered 2015-12-09 – 2015-12-10 (×2): via INTRAVENOUS

## 2015-12-09 MED ORDER — ONDANSETRON HCL 4 MG PO TABS: 4.0000 mg | ORAL_TABLET | Freq: Three times a day (TID) | ORAL | Status: DC | PRN

## 2015-12-09 MED ORDER — BISACODYL 10 MG RE SUPP
10.0000 mg | Freq: Every day | RECTAL | Status: DC | PRN
Start: 2015-12-09 — End: 2015-12-10

## 2015-12-09 MED ORDER — ONDANSETRON HCL 4 MG PO TABS: 4.0000 mg | ORAL_TABLET | Freq: Four times a day (QID) | ORAL | Status: DC | PRN

## 2015-12-09 MED ORDER — ENOXAPARIN SODIUM 40 MG/0.4ML ~~LOC~~ SOLN
40.0000 mg | SUBCUTANEOUS | Status: DC
Start: 2015-12-09 — End: 2015-12-10

## 2015-12-09 MED ORDER — DEXTROSE-NACL 5-0.45 % IV SOLN
INTRAVENOUS | Status: AC
  Administered 2015-12-09: 09:00:00 via INTRAVENOUS

## 2015-12-09 MED ORDER — ACETAMINOPHEN 325 MG PO TABS: 650.0000 mg | ORAL_TABLET | ORAL | Status: DC | PRN

## 2015-12-09 MED ORDER — INSULIN ASPART 100 UNIT/ML ~~LOC~~ SOLN
0.0000 [IU] | Freq: Three times a day (TID) | SUBCUTANEOUS | Status: DC
  Administered 2015-12-09: 1 [IU] via SUBCUTANEOUS
  Administered 2015-12-09: 2 [IU] via SUBCUTANEOUS

## 2015-12-09 MED ORDER — SODIUM CHLORIDE 0.9 % IV BOLUS (SEPSIS)
1000.0000 mL | Freq: Once | INTRAVENOUS | Status: AC
  Administered 2015-12-09: 1000 mL via INTRAVENOUS

## 2015-12-09 MED ORDER — LORAZEPAM 1 MG PO TABS: 1.0000 mg | ORAL_TABLET | Freq: Three times a day (TID) | ORAL | Status: DC | PRN

## 2015-12-09 MED ORDER — SODIUM CHLORIDE 0.9% FLUSH
3.0000 mL | Freq: Two times a day (BID) | INTRAVENOUS | Status: DC
  Administered 2015-12-09: 3 mL via INTRAVENOUS

## 2015-12-09 MED ORDER — CETYLPYRIDINIUM CHLORIDE 0.05 % MT LIQD
7.0000 mL | Freq: Two times a day (BID) | OROMUCOSAL | Status: DC
  Administered 2015-12-09 – 2015-12-10 (×2): 7 mL via OROMUCOSAL

## 2015-12-09 MED ORDER — PNEUMOCOCCAL VAC POLYVALENT 25 MCG/0.5ML IJ INJ
0.5000 mL | INJECTION | INTRAMUSCULAR | Status: DC
  Filled 2015-12-09: qty 0.5

## 2015-12-09 MED ORDER — ONDANSETRON HCL 4 MG/2ML IJ SOLN: 4.0000 mg | Freq: Four times a day (QID) | INTRAMUSCULAR | Status: DC | PRN

## 2015-12-09 NOTE — ED Notes (Signed)
Pt came baCK FROM C-T SLEEPIONG SOUNDLY

## 2015-12-09 NOTE — ED Notes (Signed)
Family back at the bedside to help keep her awake

## 2015-12-09 NOTE — ED Notes (Signed)
Ems gave thwe pt narcan iv 1mg  at 0220 and 1mg  iv at 0345 just as they arrived here at the hospital.  She told ems she took pills at 2300 she told me around 0300 .  The pt was refusing care initially  Then became too sedated

## 2015-12-09 NOTE — ED Notes (Signed)
The pt reports that she took 3-5 mg oxycodone around 0300am.  She arrived by gems from home.  She was found in the br floor around 0300 slow to respond .  She had a rx filled for 120  5 mg oxycodone on the 16th.  75 pills were left .  Ems gave the pt narcan 1mg  x 2 enroute here iv.  On arrival alert oriented skin warm and  Dry.  She has chronic pain  Iv per ems

## 2015-12-09 NOTE — H&P (Signed)
History and Physical    Taylor Benitez AOZ:[id number] DOB: 1960/10/17 DOA: 12/09/2015   PCP: Roxy Manns, MD   Patient coming from:  Home   Chief Complaint: Drug Overdose  HPI: Taylor Benitez is a 55 y.o. female with medical history significant for OA, Thyroid disease, HTN, DM, Opioid dependence, Anxiety, brought by EMS to the emergency department for the management of drug overdose. Her roommate report, the patient to go both 3-5 tablets of oxycodone 5 hours prior to arrival, experiencing loss of consciousness, falling into the bathroom floor. Last seen awake around 1 am, last pills taken 11 pm  On the scene, she was unresponsive. Per chart report, she has a prescription for oxycodone refilled on 12/07/2015 for 120 tablets, but when they arrived, 35 were missing. The patient received 2 mg total of Narcan in route to the ED. Her mother reports that she has been dealing with pain. Over use, taking Seroquel oxycodone at the time. No recent history of alcohol abuse, but she does smoke marijuana. Other history cannot be obtained, due to the patient's current mental status. Currently, her respiratory status is being maintained, she is somnolent but arousable.   ED Course:  BP 107/64 mmHg  Pulse 84  Resp 14  SpO2 97%  Chest x-ray with no acute abnormalities  CT of the C-spine without contrast is negative for any fractures.  Hemoglobin 11.9, Tylenol less than 10, salyciate less than 4,  potassium 3.4, creatinine 1.11, ALT 13, GFR 55,CK 59 UDS, and UA pending EKG normal sinus rhythm, without acute coronary changes, QTC 484.  Review of Systems: As per HPI otherwise unable to obtain due to altered mental status   Past Medical History  Diagnosis Date  . Thyroid disease   . Hypertension     History reviewed. No pertinent past surgical history.  Social History Social History   Social History  . Marital Status: Married    Spouse Name: N/A  . Number of Children: N/A  . Years of Education:  N/A   Occupational History  . Not on file.   Social History Main Topics  . Smoking status: Current Every Day Smoker  . Smokeless tobacco: Not on file  . Alcohol Use: No  . Drug Use: Not on file  . Sexual Activity: Not on file   Other Topics Concern  . Not on file   Social History Narrative  . No narrative on file     Allergies  Allergen Reactions  . Penicillins     REACTION: rash    No family history on file.    Prior to Admission medications   Not on File    Physical Exam:    Filed Vitals:   12/09/15 0615 12/09/15 0630 12/09/15 0645 12/09/15 0718  BP: 102/67 109/64 110/65 107/64  Pulse: 83 84 80 84  Resp: 10 10 10 14   SpO2: 96% 96% 92% 97%        Constitutional: lethargic, arousable to sounds, but not able to verbally respond  Filed Vitals:   12/09/15 0615 12/09/15 0630 12/09/15 0645 12/09/15 0718  BP: 102/67 109/64 110/65 107/64  Pulse: 83 84 80 84  Resp: 10 10 10 14   SpO2: 96% 96% 92% 97%   Eyes: PERRL, lids and conjunctivae normal. She opens and closes eyes sporadically ENMT: Mucous membranes are moist. Posterior pharynx clear of any exudate or lesions. Several cavities notedNeck: normal, supple, no masses, no thyromegaly Respiratory: clear to auscultation bilaterally, no wheezing, no crackles. Normal  respiratory effort. No accessory muscle use.  Cardiovascular: Regular rate and rhythm, no murmurs / rubs / gallops. No extremity edema. 2+ pedal pulses. No carotid bruits.  Abdomen: no tenderness, no masses palpated. No hepatosplenomegaly. Bowel sounds positive.  Musculoskeletal: no clubbing / cyanosis. No signs of hematoma, no gross deformities in her extremities.  Skin: no rashes, lesions, ulcers.  Neurologic: Unable to assess, due to the patient's current mental status. Patient is very lethargic, unable to follow commands.     Labs on Admission: I have personally reviewed following labs and imaging studies  CBC:  Recent Labs Lab  12/09/15 0431  WBC 8.5  NEUTROABS 6.6  HGB 11.9*  HCT 38.5  MCV 85.4  PLT 171    Basic Metabolic Panel:  Recent Labs Lab 12/09/15 0431  NA 136  K 3.4*  CL 101  CO2 24  GLUCOSE 153*  BUN 18  CREATININE 1.11*  CALCIUM 9.2    GFR: CrCl cannot be calculated (Unknown ideal weight.).  Liver Function Tests:  Recent Labs Lab 12/09/15 0431  AST 15  ALT 13*  ALKPHOS 97  BILITOT 1.2  PROT 6.9  ALBUMIN 3.7   No results for input(s): LIPASE, AMYLASE in the last 168 hours. No results for input(s): AMMONIA in the last 168 hours.  Coagulation Profile: No results for input(s): INR, PROTIME in the last 168 hours.  Cardiac Enzymes:  Recent Labs Lab 12/09/15 0431  CKTOTAL 59    BNP (last 3 results) No results for input(s): PROBNP in the last 8760 hours.  HbA1C: No results for input(s): HGBA1C in the last 72 hours.  CBG: No results for input(s): GLUCAP in the last 168 hours.  Lipid Profile: No results for input(s): CHOL, HDL, LDLCALC, TRIG, CHOLHDL, LDLDIRECT in the last 72 hours.  Thyroid Function Tests: No results for input(s): TSH, T4TOTAL, FREET4, T3FREE, THYROIDAB in the last 72 hours.  Anemia Panel: No results for input(s): VITAMINB12, FOLATE, FERRITIN, TIBC, IRON, RETICCTPCT in the last 72 hours.  Urine analysis:    Component Value Date/Time   COLORURINE YELLOW 01/29/2009 0533   APPEARANCEUR CLEAR 01/29/2009 0533   LABSPEC 1.011 01/29/2009 0533   PHURINE 6.5 01/29/2009 0533   GLUCOSEU NEGATIVE 01/29/2009 0533   HGBUR NEGATIVE 01/29/2009 0533   BILIRUBINUR NEGATIVE 01/29/2009 0533   KETONESUR NEGATIVE 01/29/2009 0533   PROTEINUR NEGATIVE 01/29/2009 0533   UROBILINOGEN 1.0 01/29/2009 0533   NITRITE NEGATIVE 01/29/2009 0533   LEUKOCYTESUR TRACE* 01/29/2009 0533    Sepsis Labs: @LABRCNTIP (procalcitonin:4,lacticidven:4) )No results found for this or any previous visit (from the past 240 hour(s)).   Radiological Exams on Admission: Ct Head Wo  Contrast  12/09/2015  CLINICAL DATA:  Patient took oxycodone surround 3 a.m. and then was found on the floor, slow to respond. EXAM: CT HEAD WITHOUT CONTRAST CT CERVICAL SPINE WITHOUT CONTRAST TECHNIQUE: Multidetector CT imaging of the head and cervical spine was performed following the standard protocol without intravenous contrast. Multiplanar CT image reconstructions of the cervical spine were also generated. COMPARISON:  None. FINDINGS: CT HEAD FINDINGS Ventricles and sulci appear symmetrical. No ventricular dilatation. No mass effect or midline shift. No abnormal extra-axial fluid collections. Gray-white matter junctions are distinct. Basal cisterns are not effaced. No evidence of acute intracranial hemorrhage. No depressed skull fractures. Mucosal thickening in the paranasal sinuses. No acute air-fluid levels. Mastoid air cells are not opacified. CT CERVICAL SPINE FINDINGS Normal alignment of the cervical spine. Degenerative changes throughout the cervical spine with narrowed interspaces and endplate  hypertrophic changes. Degenerative changes in the facet joints. Cervical spine demonstrates heterogeneous sclerotic appearance with focal lucencies likely to represent degenerative changes. However, metabolic bone disease or metastatic disease could also have this appearance. Correlation with clinical history is recommended. If patient is at high risk for metastatic disease, consider bone scan for further evaluation. Erosive changes demonstrated at the clavicular heads and manubrium could also represent arthritic changes, infection, or metastatic disease. No vertebral compression deformities. Posterior elements appear intact. No prevertebral soft tissue swelling. C1-2 articulation appears intact. Vascular calcifications. Soft tissues are unremarkable. IMPRESSION: No acute intracranial abnormalities. Cervical spine and sternoclavicular joints demonstrate sclerosis and lucent change possibly due to degenerative  changes although infection, metabolic disease, or metastatic disease could also potentially have this appearance. Consider follow-up with bone scan if clinically indicated. No acute displaced cervical spine fractures identified. Electronically Signed   By: Burman Nieves M.D.   On: 12/09/2015 05:29   Ct Cervical Spine Wo Contrast  12/09/2015  CLINICAL DATA:  Patient took oxycodone surround 3 a.m. and then was found on the floor, slow to respond. EXAM: CT HEAD WITHOUT CONTRAST CT CERVICAL SPINE WITHOUT CONTRAST TECHNIQUE: Multidetector CT imaging of the head and cervical spine was performed following the standard protocol without intravenous contrast. Multiplanar CT image reconstructions of the cervical spine were also generated. COMPARISON:  None. FINDINGS: CT HEAD FINDINGS Ventricles and sulci appear symmetrical. No ventricular dilatation. No mass effect or midline shift. No abnormal extra-axial fluid collections. Gray-white matter junctions are distinct. Basal cisterns are not effaced. No evidence of acute intracranial hemorrhage. No depressed skull fractures. Mucosal thickening in the paranasal sinuses. No acute air-fluid levels. Mastoid air cells are not opacified. CT CERVICAL SPINE FINDINGS Normal alignment of the cervical spine. Degenerative changes throughout the cervical spine with narrowed interspaces and endplate hypertrophic changes. Degenerative changes in the facet joints. Cervical spine demonstrates heterogeneous sclerotic appearance with focal lucencies likely to represent degenerative changes. However, metabolic bone disease or metastatic disease could also have this appearance. Correlation with clinical history is recommended. If patient is at high risk for metastatic disease, consider bone scan for further evaluation. Erosive changes demonstrated at the clavicular heads and manubrium could also represent arthritic changes, infection, or metastatic disease. No vertebral compression deformities.  Posterior elements appear intact. No prevertebral soft tissue swelling. C1-2 articulation appears intact. Vascular calcifications. Soft tissues are unremarkable. IMPRESSION: No acute intracranial abnormalities. Cervical spine and sternoclavicular joints demonstrate sclerosis and lucent change possibly due to degenerative changes although infection, metabolic disease, or metastatic disease could also potentially have this appearance. Consider follow-up with bone scan if clinically indicated. No acute displaced cervical spine fractures identified. Electronically Signed   By: Burman Nieves M.D.   On: 12/09/2015 05:29    EKG: Independently reviewed.  Assessment/Plan Principal Problem:   Overdose Active Problems:   Toxic encephalopathy   Thyroid disease   Diabetes (HCC)   Arthritis   Acute Confusional State likely due to drug overdose in a patient with chronic pain syndrome due to osteoarthritis .Marland Kitchen CT head i and C spine  negative for acute intracranial or spinal  abnormalities. Noseizures noted. Afebrile. WBC 8.5 , VSS. UA pending.  UDS pending. KG without ACS  Admit to telemetry Seizure precautions Frequent neuro check q 2 hrs A1C BMET and CBG monitoring Ammonia levels -Continue Narcan if respiratory status is comprommised  Hold home oral meds including sedative medications Bed rest at 30 degrees   Presumed Type II Diabetes Current blood sugar level  [id number], no prior A1C checked Hgb A1C  SSI Heart healthy carb modified diet when able to pass swallow eval at bedside  Thyroid disease, per chart, not on meds -Checking TSH   Will follow-up and adjust dose if needed.  Anxiety/Depression Patient on Klonopin and Amitriptyline as OP. Due to risk of respiratory depression, will hold these meds on admission until she passes bedside swallow eval by nursing.     DVT prophylaxis: Lovenox Code Status:   Full     Family Communication:  Discussed with mother Disposition Plan: Expect patient to  be discharged to home after condition improves Consults called:    None Admission status:Tele  Obs    Rhealyn Cullen E, PA-C [facility]   If 7PM-7AM, please contact night-coverage [email or web address] Password [email or web address]  12/09/2015, 8:06 AM

## 2015-12-09 NOTE — ED Notes (Signed)
Attempted to call report. Issue with bed placement. RN to call back.

## 2015-12-09 NOTE — ED Notes (Signed)
Nasal 02 applied for resp support

## 2015-12-09 NOTE — ED Notes (Signed)
Attempted to call report x2. Nurse to call back. 

## 2015-12-09 NOTE — Progress Notes (Signed)
Patient recieved from ED alert  And oriented but sleepy and easy to arouse. Patient stated she has fallen in the past is made a high fall risk and bed alram is on.patient oriented to room and unit protocol.

## 2015-12-09 NOTE — ED Notes (Signed)
cbg by gems  132

## 2015-12-09 NOTE — ED Notes (Signed)
The pts mother and friend are at the bedside.  The pt is attempting to sleep but i have told them to keep her awake.  They are and the pt is not very happy about it

## 2015-12-09 NOTE — ED Provider Notes (Addendum)
CSN: [id number]     Arrival date & time 12/09/15  0348 History  By signing my name below, I, Taylor Benitez, attest that this documentation has been prepared under the direction and in the presence of Taylor Benitez.  Electronically Signed: Rosario Benitez, ED Scribe. 12/09/2015. 4:22 AM.  Chief Complaint  Patient presents with  . Ingestion   LEVEL 5 CAVEAT: HPI and ROS limited due to altered mental status of the patient.  The history is provided by the patient and the [facility] personnel. No language interpreter was used.   HPI Comments: Taylor Benitez BIB Benitez is a 55 y.o. female who presents to the Emergency Department for evaluation of a possible drug overdose. Pt reports that she took 3-5 tablet of Oxycodone approximately 5 hour PTA and experienced LOC. Per Benitez, pt was found on her bathroom floor and appeared to be unresponsive on scene. They report that she had her prescription for Oxycodone was refilled on 12/07/15 for 120 tablets, and when they arrived she only had 75 tablets left. She was given 2 x 1mg  Narcan  en route to the ED. Pt states that she has a hx of pain pill overuse, and that only two Oxycodone are not enough for her. She denies alcohol abuse, but states that she regularly smokes marijuana. Pt denies SI or HI at this time.   Past Medical History  Diagnosis Date  . Thyroid disease   . Hypertension    History reviewed. No pertinent past surgical history. No family history on file. Social History  Substance Use Topics  . Smoking status: Current Every Day Smoker  . Smokeless tobacco: None  . Alcohol Use: No   OB History    No data available     Review of Systems  LEVEL 5 CAVEAT: HPI and ROS limited due to altered mental status of the patient.    Allergies  Penicillins  Home Medications   Prior to Admission medications   Not on File   BP 100/59 mmHg  Pulse 84  Resp 11  SpO2 96%   Physical Exam  Constitutional: She appears well-developed and  well-nourished.  HENT:  Head: Normocephalic.  Saccadic eye movement with gaze on both directions.  Eyes: Conjunctivae are normal.  Cardiovascular: Normal rate, regular rhythm and normal heart sounds.   No murmur heard. Pulmonary/Chest: Effort normal and breath sounds normal. No respiratory distress. She has no wheezes. She has no rales.  Abdominal: Soft. She exhibits no distension. There is no tenderness. There is no rebound and no guarding.  Musculoskeletal: Normal range of motion.  No midline C-spine tenderness or stepoffs. No signs of hematoma. There are no gross deformities to bilateral upper or lower extremities. Her grip strength is equal bilaterally. Her pelvis is stable.    Neurological: She is alert.  Skin: Skin is warm and dry.  Psychiatric: She has a normal mood and affect. Her behavior is normal.  Nursing note and vitals reviewed.  ED Course  Procedures (including critical care time)  COORDINATION OF CARE: 4:14 AM-Discussed next steps with pt including CT Head w/o contrast, and Ct C-spine w/o contrast, CBC, UA, and CMP. Pt verbalized understanding and is agreeable with the plan.   Labs Review Labs Reviewed  CBC WITH DIFFERENTIAL/PLATELET - Abnormal; Notable for the following:    Hemoglobin 11.9 (*)    RDW 16.5 (*)    All other components within normal limits  ACETAMINOPHEN LEVEL - Abnormal; Notable for the following:  Acetaminophen (Tylenol), Serum <10 (*)    All other components within normal limits  COMPREHENSIVE METABOLIC PANEL - Abnormal; Notable for the following:    Potassium 3.4 (*)    Glucose, Bld 153 (*)    Creatinine, Ser 1.11 (*)    ALT 13 (*)    GFR calc non Af Amer 55 (*)    All other components within normal limits  SALICYLATE LEVEL  ETHANOL  CK  URINE RAPID DRUG SCREEN, HOSP PERFORMED  URINALYSIS, ROUTINE W REFLEX MICROSCOPIC (NOT AT [facility])    Imaging Review Ct Head Wo Contrast  12/09/2015  CLINICAL DATA:  Patient took oxycodone surround 3  a.m. and then was found on the floor, slow to respond. EXAM: CT HEAD WITHOUT CONTRAST CT CERVICAL SPINE WITHOUT CONTRAST TECHNIQUE: Multidetector CT imaging of the head and cervical spine was performed following the standard protocol without intravenous contrast. Multiplanar CT image reconstructions of the cervical spine were also generated. COMPARISON:  None. FINDINGS: CT HEAD FINDINGS Ventricles and sulci appear symmetrical. No ventricular dilatation. No mass effect or midline shift. No abnormal extra-axial fluid collections. Gray-white matter junctions are distinct. Basal cisterns are not effaced. No evidence of acute intracranial hemorrhage. No depressed skull fractures. Mucosal thickening in the paranasal sinuses. No acute air-fluid levels. Mastoid air cells are not opacified. CT CERVICAL SPINE FINDINGS Normal alignment of the cervical spine. Degenerative changes throughout the cervical spine with narrowed interspaces and endplate hypertrophic changes. Degenerative changes in the facet joints. Cervical spine demonstrates heterogeneous sclerotic appearance with focal lucencies likely to represent degenerative changes. However, metabolic bone disease or metastatic disease could also have this appearance. Correlation with clinical history is recommended. If patient is at high risk for metastatic disease, consider bone scan for further evaluation. Erosive changes demonstrated at the clavicular heads and manubrium could also represent arthritic changes, infection, or metastatic disease. No vertebral compression deformities. Posterior elements appear intact. No prevertebral soft tissue swelling. C1-2 articulation appears intact. Vascular calcifications. Soft tissues are unremarkable. IMPRESSION: No acute intracranial abnormalities. Cervical spine and sternoclavicular joints demonstrate sclerosis and lucent change possibly due to degenerative changes although infection, metabolic disease, or metastatic disease could  also potentially have this appearance. Consider follow-up with bone scan if clinically indicated. No acute displaced cervical spine fractures identified. Electronically Signed   By: Burman Nieves M.D.   On: 12/09/2015 05:29   Ct Cervical Spine Wo Contrast  12/09/2015  CLINICAL DATA:  Patient took oxycodone surround 3 a.m. and then was found on the floor, slow to respond. EXAM: CT HEAD WITHOUT CONTRAST CT CERVICAL SPINE WITHOUT CONTRAST TECHNIQUE: Multidetector CT imaging of the head and cervical spine was performed following the standard protocol without intravenous contrast. Multiplanar CT image reconstructions of the cervical spine were also generated. COMPARISON:  None. FINDINGS: CT HEAD FINDINGS Ventricles and sulci appear symmetrical. No ventricular dilatation. No mass effect or midline shift. No abnormal extra-axial fluid collections. Gray-white matter junctions are distinct. Basal cisterns are not effaced. No evidence of acute intracranial hemorrhage. No depressed skull fractures. Mucosal thickening in the paranasal sinuses. No acute air-fluid levels. Mastoid air cells are not opacified. CT CERVICAL SPINE FINDINGS Normal alignment of the cervical spine. Degenerative changes throughout the cervical spine with narrowed interspaces and endplate hypertrophic changes. Degenerative changes in the facet joints. Cervical spine demonstrates heterogeneous sclerotic appearance with focal lucencies likely to represent degenerative changes. However, metabolic bone disease or metastatic disease could also have this appearance. Correlation with clinical history is recommended. If patient  is at high risk for metastatic disease, consider bone scan for further evaluation. Erosive changes demonstrated at the clavicular heads and manubrium could also represent arthritic changes, infection, or metastatic disease. No vertebral compression deformities. Posterior elements appear intact. No prevertebral soft tissue swelling.  C1-2 articulation appears intact. Vascular calcifications. Soft tissues are unremarkable. IMPRESSION: No acute intracranial abnormalities. Cervical spine and sternoclavicular joints demonstrate sclerosis and lucent change possibly due to degenerative changes although infection, metabolic disease, or metastatic disease could also potentially have this appearance. Consider follow-up with bone scan if clinically indicated. No acute displaced cervical spine fractures identified. Electronically Signed   By: Burman Nieves M.D.   On: 12/09/2015 05:29    I have personally reviewed and evaluated these images and lab results as part of my medical decision-making.   EKG Interpretation   Date/Time:  Sunday December 09 2015 03:54:33 EDT Ventricular Rate:  87 PR Interval:    QRS Duration: 104 QT Interval:  402 QTC Calculation: 484 R Axis:   -147 Text Interpretation:  Sinus rhythm Consider left atrial enlargement  Probable RVH w/ secondary repol abnormality No acute changes Confirmed by  Rhunette Croft, Benitez, Janey Genta [id number]) on 12/09/2015 6:31:45 AM      MDM   Final diagnoses:  Toxic encephalopathy  Narcotic overdose, accidental or unintentional, initial encounter    I personally performed the services described in this documentation, which was scribed in my presence. The recorded information has been reviewed and is accurate.  Pt comes in with cc of ams. Found down, unresposive by roommate, [facility]itez arrived and pt was given narcan x 2 with some response. PT was allegedly apneic when they arrived. Currently, resp status is being maintained, airway is being protected. Pt is somnolent, but arousable. She is not providing good history. She denies SI, but does indicate misuse of her pain meds. Spoke with the room mate and mother at 6:00 am - and the endorse improper dosing by patient after she picks up her rx routinely. At 6 am, pt is in deep sleep, and needed sternal rub to wake up. She has a gag. Will admit for  toxic encephalopathy, likely narcotics related. Based on reassessment x 2 times, pt is stable for admission to telemetry.    Taylor Benitez 12/09/15 [id number]  Taylor Benitez 12/09/15 [phone]

## 2015-12-09 NOTE — Progress Notes (Signed)
Patient decided to keep necklace on silver necklace with clear stones in shape of a cross

## 2015-12-10 DIAGNOSIS — G92 Toxic encephalopathy: Secondary | ICD-10-CM

## 2015-12-10 DIAGNOSIS — T40601A Poisoning by unspecified narcotics, accidental (unintentional), initial encounter: Secondary | ICD-10-CM | POA: Diagnosis not present

## 2015-12-10 DIAGNOSIS — Z9189 Other specified personal risk factors, not elsewhere classified: Secondary | ICD-10-CM | POA: Insufficient documentation

## 2015-12-10 LAB — HEMOGLOBIN A1C
HEMOGLOBIN A1C: 6.3 % — AB (ref 4.8–5.6)
Mean Plasma Glucose: 134 mg/dL

## 2015-12-10 LAB — COMPREHENSIVE METABOLIC PANEL
ALT: 12 U/L — ABNORMAL LOW (ref 14–54)
ANION GAP: 5 (ref 5–15)
AST: 17 U/L (ref 15–41)
Albumin: 2.9 g/dL — ABNORMAL LOW (ref 3.5–5.0)
Alkaline Phosphatase: 83 U/L (ref 38–126)
BILIRUBIN TOTAL: 0.3 mg/dL (ref 0.3–1.2)
BUN: 13 mg/dL (ref 6–20)
CO2: 24 mmol/L (ref 22–32)
Calcium: 8.3 mg/dL — ABNORMAL LOW (ref 8.9–10.3)
Chloride: 108 mmol/L (ref 101–111)
Creatinine, Ser: 0.83 mg/dL (ref 0.44–1.00)
GFR calc Af Amer: >60 mL/min (ref >60)
Glucose, Bld: 111 mg/dL — ABNORMAL HIGH (ref 65–99)
POTASSIUM: 3.7 mmol/L (ref 3.5–5.1)
Sodium: 137 mmol/L (ref 135–145)
TOTAL PROTEIN: 5.6 g/dL — AB (ref 6.5–8.1)

## 2015-12-10 LAB — CBC
HEMATOCRIT: 33.5 % — AB (ref 36.0–46.0)
HEMOGLOBIN: 10.2 g/dL — AB (ref 12.0–15.0)
MCH: 26.5 pg (ref 26.0–34.0)
MCHC: 30.4 g/dL (ref 30.0–36.0)
MCV: 87 fL (ref 78.0–100.0)
Platelets: 131 10*3/uL — ABNORMAL LOW (ref 150–400)
RBC: 3.85 MIL/uL — ABNORMAL LOW (ref 3.87–5.11)
RDW: 16.8 % — AB (ref 11.5–15.5)
WBC: 4.5 10*3/uL (ref 4.0–10.5)

## 2015-12-10 LAB — GLUCOSE, CAPILLARY
GLUCOSE-CAPILLARY: 106 mg/dL — AB (ref 65–99)
Glucose-Capillary: 175 mg/dL — ABNORMAL HIGH (ref 65–99)

## 2015-12-10 MED ORDER — OXYCODONE HCL 5 MG PO TABS: 5.0000 mg | ORAL_TABLET | Freq: Three times a day (TID) | ORAL | Status: DC | PRN

## 2015-12-10 MED ORDER — POTASSIUM CHLORIDE CRYS ER 20 MEQ PO TBCR
40.0000 meq | EXTENDED_RELEASE_TABLET | Freq: Once | ORAL | Status: AC
  Administered 2015-12-10: 40 meq via ORAL
  Filled 2015-12-10: qty 2

## 2015-12-10 MED ORDER — CLONAZEPAM 0.5 MG PO TABS: 0.5000 mg | ORAL_TABLET | Freq: Two times a day (BID) | ORAL | Status: DC | PRN

## 2015-12-10 NOTE — Evaluation (Signed)
Physical Therapy Evaluation Patient Details Name: NAVEENA EYMAN MRN: [id number] DOB: 01-02-1961 Today's Date: 12/10/2015   History of Present Illness  Patient is a 55 y/o female with hx of thyroid disease, HTN and arthritis, opiate dependence/abuse, & anxietywho presents with unresponsiveness felt to be a drug overdose w/ oxycodone. Pt responded to narcan 2mg  total.  Clinical Impression  Patient presents with generalized weakness, limited AROM/strength in BLEs secondary to arthritis, hx of neuropathy and decreased endurance impacting mobility. Pt is mainly a household ambulator as she is limited due to arthritic pain in knee/hip joints. Pt reports multiple falls at home. Discussed importance of using RW however pt does not want to use one at her age. Pt may now be agreeable but does not have one. Really wants to get back to do water aerobics but does not have transportation. Would benefit from HHPT to maximize independence and mobility so pt can return to PLOF. Will follow.    Follow Up Recommendations Home health PT;Supervision for mobility/OOB    Equipment Recommendations  Rolling walker with 5" wheels    Recommendations for Other Services OT consult     Precautions / Restrictions Precautions Precautions: Fall Restrictions Weight Bearing Restrictions: No      Mobility  Bed Mobility Overal bed mobility: Needs Assistance Bed Mobility: Supine to Sit     Supine to sit: Supervision;HOB elevated     General bed mobility comments: Supervision for safety. Increased time.  Transfers Overall transfer level: Needs assistance Equipment used: Rolling walker (2 wheeled) Transfers: Sit to/from Stand Sit to Stand: Min guard         General transfer comment: Min guard for safety. Slow to stand with cues for hand placement.   Ambulation/Gait Ambulation/Gait assistance: Min guard Ambulation Distance (Feet): 50 Feet Assistive device: Rolling walker (2 wheeled) Gait  Pattern/deviations: Step-to pattern;Step-through pattern;Decreased stride length;Trunk flexed Gait velocity: decreased Gait velocity interpretation: Below normal speed for age/gender General Gait Details: Slow, unsteady gait with increased WB through BUEs due to pain in knees with weightbearing.   Stairs            Wheelchair Mobility    Modified Rankin (Stroke Patients Only)       Balance Overall balance assessment: Needs assistance;History of Falls Sitting-balance support: Feet supported;No upper extremity supported Sitting balance-Leahy Scale: Good     Standing balance support: During functional activity Standing balance-Leahy Scale: Poor Standing balance comment: Reliant on UEs for support.                              Pertinent Vitals/Pain Pain Assessment: Faces Faces Pain Scale: Hurts little more Pain Location: bil knees, rt hip with movement Pain Descriptors / Indicators: Sore;Aching Pain Intervention(s): Monitored during session;Repositioned;Limited activity within patient's tolerance    Home Living Family/patient expects to be discharged to:: Private residence Living Arrangements: Other (Comment) (roommate who used to be a CNA)   Type of Home: Mobile home Home Access: Stairs to enter Entrance Stairs-Rails: Right Entrance Stairs-Number of Steps: 2 or 3 with rails Home Layout: One level Home Equipment: Cane - single point;Shower seat;Hand held shower head;Grab bars - tub/shower;Toilet riser      Prior Function Level of Independence: Independent with assistive device(s)         Comments: Using SPC PTA. Pt only ambulate short household distances due to SOB and pain in knees. Does not drive. Roommate does grocery shopping. Reports multiple falls at home.  Hand Dominance        Extremity/Trunk Assessment   Upper Extremity Assessment: Defer to OT evaluation           Lower Extremity Assessment: Generalized weakness;RLE  deficits/detail;LLE deficits/detail RLE Deficits / Details: Limited AROM throughout knee joint secondary to pain/arthritis. Swelling present in knee. LLE Deficits / Details: Limited AROM throughout knee joint secondary to pain/arthritis. Swelling present in knee.     Communication   Communication: No difficulties  Cognition Arousal/Alertness: Awake/alert Behavior During Therapy: WFL for tasks assessed/performed Overall Cognitive Status: Within Functional Limits for tasks assessed                      General Comments General comments (skin integrity, edema, etc.): Pt does not want to use RW. "I just don't." Really wants resources to be able to get around and have transportation but was denied by Medicaid.    Exercises        Assessment/Plan    PT Assessment Patient needs continued PT services  PT Diagnosis Difficulty walking;Generalized weakness;Acute pain   PT Problem List Decreased strength;Pain;Decreased range of motion;Impaired sensation;Decreased activity tolerance;Decreased balance;Decreased mobility  PT Treatment Interventions Balance training;Stair training;Functional mobility training;Therapeutic activities;Therapeutic exercise;Patient/family education;Gait training   PT Goals (Current goals can be found in the Care Plan section) Acute Rehab PT Goals Patient Stated Goal: to be able to get back to doing things i used to do PT Goal Formulation: With patient Time For Goal Achievement: 12/24/15 Potential to Achieve Goals: Good    Frequency Min 3X/week   Barriers to discharge Decreased caregiver support lives basically alone as roommate is not home during the day    Co-evaluation               End of Session Equipment Utilized During Treatment: Gait belt Activity Tolerance: Patient limited by pain Patient left: in bed;with call bell/phone within reach;with bed alarm set;with nursing/sitter in room Nurse Communication: Mobility status    Functional  Assessment Tool Used: clinical judgment Functional Limitation: Mobility: Walking and moving around Mobility: Walking and Moving Around Current Status (X3244): At least 1 percent but less than 20 percent impaired, limited or restricted Mobility: Walking and Moving Around Goal Status [phone]): At least 1 percent but less than 20 percent impaired, limited or restricted    Time: 0955-1026 PT Time Calculation (min) (ACUTE ONLY): 31 min   Charges:   PT Evaluation $PT Eval Moderate Complexity: 1 Procedure PT Treatments $Gait Training: 8-22 mins   PT G Codes:   PT G-Codes **NOT FOR INPATIENT CLASS** Functional Assessment Tool Used: clinical judgment Functional Limitation: Mobility: Walking and moving around Mobility: Walking and Moving Around Current Status (O5366): At least 1 percent but less than 20 percent impaired, limited or restricted Mobility: Walking and Moving Around Goal Status [phone]): At least 1 percent but less than 20 percent impaired, limited or restricted    Mallary Kreger A Ludene Stokke 12/10/2015, 11:13 AM Mylo Red, PT, DPT [phone]

## 2015-12-10 NOTE — Evaluation (Signed)
Occupational Therapy Evaluation Patient Details Name: Taylor Benitez MRN: [id number] DOB: 1960/12/01 Today's Date: 12/10/2015    History of Present Illness Patient is a 55 y/o female with hx of thyroid disease, HTN and arthritis, opiate dependence/abuse, & anxietywho presents with unresponsiveness felt to be a drug overdose w/ oxycodone. Pt responded to narcan 2mg  total.   Clinical Impression   Pt was performing ADL and IADL at a modified independent level prior to admission. She has a hx of falls and difficulty ambulating due to longstanding knee and hip arthritis. Pt presents with decreased endurance and balance with LE weakness and pain. Issued pt a reacher and instructed in multiple uses including LB dressing. Pt with all necessary DME with the exception of her own RW. Pt stating a walker is not practical in her home. Pt with many concerns related to transportation and the need to have her teeth extracted. Pt to return home today.   Follow Up Recommendations  No OT follow up    Equipment Recommendations  None recommended by OT    Recommendations for Other Services       Precautions / Restrictions Precautions Precautions: Fall Restrictions Weight Bearing Restrictions: No      Mobility Bed Mobility Overal bed mobility: Needs Assistance Bed Mobility: Supine to Sit;Sit to Supine     Supine to sit: Supervision;HOB elevated Sit to supine: Supervision;HOB elevated   General bed mobility comments: Supervision for safety. Increased time.  Transfers Overall transfer level: Needs assistance Equipment used: Rolling walker (2 wheeled) Transfers: Sit to/from Stand Sit to Stand: Min guard         General transfer comment: Min guard for safety. Slow to stand with cues for hand placement.     Balance Overall balance assessment: Needs assistance;History of Falls Sitting-balance support: Feet supported;No upper extremity supported Sitting balance-Leahy Scale: Good      Standing balance support: During functional activity Standing balance-Leahy Scale: Poor Standing balance comment: Reliant on UEs for support.                             ADL Overall ADL's : At baseline                                             Vision     Perception     Praxis      Pertinent Vitals/Pain Pain Assessment: Faces Faces Pain Scale: Hurts little more Pain Location: knees--chronic Pain Descriptors / Indicators: Aching;Sore;Guarding;Grimacing Pain Intervention(s): Monitored during session;Repositioned     Hand Dominance Right   Extremity/Trunk Assessment Upper Extremity Assessment Upper Extremity Assessment: Overall WFL for tasks assessed   Lower Extremity Assessment Lower Extremity Assessment: Defer to PT evaluation RLE Deficits / Details: Limited AROM throughout knee joint secondary to pain/arthritis. Swelling present in knee. RLE Sensation: history of peripheral neuropathy;decreased light touch;decreased proprioception LLE Deficits / Details: Limited AROM throughout knee joint secondary to pain/arthritis. Swelling present in knee. LLE Sensation: history of peripheral neuropathy;decreased light touch;decreased proprioception       Communication Communication Communication: No difficulties   Cognition Arousal/Alertness: Awake/alert Behavior During Therapy: WFL for tasks assessed/performed Overall Cognitive Status: Within Functional Limits for tasks assessed                     General Comments  Exercises       Shoulder Instructions      Home Living Family/patient expects to be discharged to:: Private residence Living Arrangements: Non-relatives/Friends Available Help at Discharge: Friend(s);Available PRN/intermittently Type of Home: Mobile home Home Access: Stairs to enter Entrance Stairs-Number of Steps: 2 or 3 with rails Entrance Stairs-Rails: Right Home Layout: One level     Bathroom  Shower/Tub: Chief Strategy Officer: Handicapped height     Home Equipment: Cane - single point;Shower seat;Hand held shower head;Grab bars - tub/shower;Bedside commode          Prior Functioning/Environment Level of Independence: Independent with assistive device(s)        Comments: Using SPC PTA. Pt only ambulate short household distances due to SOB and pain in knees. Does not drive. Roommate does grocery shopping. Reports multiple falls at home.    OT Diagnosis: Generalized weakness (chronic)   OT Problem List:     OT Treatment/Interventions:      OT Goals(Current goals can be found in the care plan section) Acute Rehab OT Goals Patient Stated Goal: get teeth taken care of  OT Frequency:     Barriers to D/C:            Co-evaluation              End of Session Equipment Utilized During Treatment: Gait belt  Activity Tolerance: Patient limited by pain Patient left: in bed;with call bell/phone within reach   Time: 1316-1347 OT Time Calculation (min): 31 min Charges:  OT General Charges $OT Visit: 1 Procedure OT Evaluation $OT Eval Moderate Complexity: 1 Procedure G-Codes: OT G-codes **NOT FOR INPATIENT CLASS** Functional Assessment Tool Used: clinical judgement Functional Limitation: Self care Self Care Current Status (G9924): At least 1 percent but less than 20 percent impaired, limited or restricted Self Care Discharge Status [phone]): At least 1 percent but less than 20 percent impaired, limited or restricted  Evern Bio 12/10/2015, 2:18 PM  [phone]

## 2015-12-10 NOTE — Progress Notes (Signed)
Pt being discharged home via wheelchair with family. Pt alert and oriented x4. VSS. Pt c/o no pain at this time. No signs of respiratory distress. Education complete and care plans resolved. IV removed with catheter intact and pt tolerated well. No further issues at this time. Pt to follow up with PCP. Angelika Jerrett R, RN 

## 2015-12-10 NOTE — Care Management Note (Signed)
Case Management Note  Patient Details  Name: AMALI [facility] MRN: [id number] Date of Birth: [birth year]  Subjective/Objective:                 Patient in obs for unintentional overdose of meds. Patient states that she did not intend to OD, but was not sure how her medications were going to interact with each other. CM spoke with pharmacist to request they consulted her on DC meds prior to her DC today. Patient listed to have coverage with Medicare and Medicaid per [facility] note. Patient informed of this and instructed to call to request new cards.    Action/Plan:  DC to home self care.  Expected Discharge Date:                  Expected Discharge Plan:  Home/Self Care  In-House Referral:     Discharge planning Services  CM Consult  Post Acute Care Choice:  NA Choice offered to:     DME Arranged:    DME Agency:     HH Arranged:    HH Agency:     Status of Service:  Completed, signed off  Medicare Important Message Given:    Date Medicare IM Given:    Medicare IM give by:    Date Additional Medicare IM Given:    Additional Medicare Important Message give by:     If discussed at Long Length of Stay Meetings, dates discussed:    Additional Comments:  Lawerance Sabal, RN 12/10/2015, 11:07 AM

## 2015-12-10 NOTE — Discharge Instructions (Signed)
Follow with Primary MD Roxy Manns, MD in 3 days. Review your head CT scan results with your family physician within a week.   Get CBC, CMP, 2 view Chest X ray checked  by Primary MD next visit.    Activity: As tolerated with Full fall precautions use walker/cane & assistance as needed   Disposition Home     Diet:   Heart Healthy low carb.  For Heart failure patients - Check your Weight same time everyday, if you gain over 2 pounds, or you develop in leg swelling, experience more shortness of breath or chest pain, call your Primary MD immediately. Follow Cardiac Low Salt Diet and 1.5 lit/day fluid restriction.   On your next visit with your primary care physician please Get Medicines reviewed and adjusted.   Please request your Prim.MD to go over all Hospital Tests and Procedure/Radiological results at the follow up, please get all Hospital records sent to your Prim MD by signing hospital release before you go home.   If you experience worsening of your admission symptoms, develop shortness of breath, life threatening emergency, suicidal or homicidal thoughts you must seek medical attention immediately by calling 911 or calling your MD immediately  if symptoms less severe.  You Must read complete instructions/literature along with all the possible adverse reactions/side effects for all the Medicines you take and that have been prescribed to you. Take any new Medicines after you have completely understood and accpet all the possible adverse reactions/side effects.   Do not drive, operate heavy machinery, perform activities at heights, swimming or participation in water activities or provide baby sitting services if your were admitted for syncope or siezures until you have seen by Primary MD or a Neurologist and advised to do so again.  Do not drive when taking Pain medications.    Do not take more than prescribed Pain, Sleep and Anxiety Medications  Special Instructions: If you have  smoked or chewed Tobacco  in the last 2 yrs please stop smoking, stop any regular Alcohol  and or any Recreational drug use.  Wear Seat belts while driving.   Please note  You were cared for by a hospitalist during your hospital stay. If you have any questions about your discharge medications or the care you received while you were in the hospital after you are discharged, you can call the unit and asked to speak with the hospitalist on call if the hospitalist that took care of you is not available. Once you are discharged, your primary care physician will handle any further medical issues. Please note that NO REFILLS for any discharge medications will be authorized once you are discharged, as it is imperative that you return to your primary care physician (or establish a relationship with a primary care physician if you do not have one) for your aftercare needs so that they can reassess your need for medications and monitor your lab values.

## 2015-12-10 NOTE — Discharge Summary (Signed)
Taylor Benitez, is a 55 y.o. female  DOB [birth year]  MRN [id number].  Admission date:  12/09/2015  Admitting Physician  Pete Glatter, MD  Discharge Date:  12/10/2015   Primary MD  Roxy Manns, MD  Recommendations for primary care physician for things to follow:   Review CT report closely, she will require a bone scan.  Cutback narcotic and benzodiazepine dose immediately .   Admission Diagnosis  Toxic encephalopathy [G92] Overdose [T50.901A] Narcotic overdose, accidental or unintentional, initial encounter [T40.601A]   Discharge Diagnosis  Toxic encephalopathy [G92] Overdose [T50.901A] Narcotic overdose, accidental or unintentional, initial encounter [T40.601A]     Principal Problem:   Overdose Active Problems:   Toxic encephalopathy   Thyroid disease   Diabetes (HCC)   Arthritis   Narcotic overdose      Past Medical History  Diagnosis Date  . Thyroid disease   . Hypertension   . Arthritis     rhumatoid    Past Surgical History  Procedure Laterality Date  . Colon surgery      colon abcess       HPI  from the history and physical done on the day of admission:    Taylor Benitez is a 55 y.o. female with medical history significant for OA, Thyroid disease, HTN, DM, Opioid dependence, Anxiety, brought by EMS to the emergency department for the management of drug overdose. Her roommate report, the patient to go both 3-5 tablets of oxycodone 5 hours prior to arrival, experiencing loss of consciousness, falling into the bathroom floor. Last seen awake around 1 am, last pills taken 11 pm On the scene, she was unresponsive. Per chart report, she has a prescription for oxycodone refilled on 12/07/2015 for 120 tablets, but when they arrived, 35 were missing. The patient received 2 mg total of Narcan  in route to the ED. Her mother reports that she has been dealing with pain. Over use, taking Seroquel oxycodone at the time. No recent history of alcohol abuse, but she does smoke marijuana. Other history cannot be obtained, due to the patient's current mental status. Currently, her respiratory status is being maintained, she is somnolent but arousable.      Hospital Course:     1.Toxic encephalopathy due to accidental narcotic and benzodiazepine overdose. Patient chronically on very high doses of benzodiazepines and narcotics, had no intention of harming herself, she says this morning that she might have taken extra pills by mistake. Head CT nonacute. Now close to baseline, alert awake oriented 3. Have cutback her benzodiazepine and narcotic dosages, note she has not been provided with any new prescriptions. Would request PCP to start tapering off narcotics and benzodiazepines, she has no indication for chronic narcotic use/abuse.  2. Generalized arthritis. Outpatient follow with PCP, orthopedics and rheumatologist. Try to manage without high doses of narcotics and benzodiazepines.  3. Nonspecific findings on CT scan. Please review report, order outpatient bone scan.  4. Extremely poor dentition. Outpatient follow with PCP and dentist.   Follow UP  Follow-up Information    Follow up with Roxy Manns, MD. Schedule an appointment as soon as possible for a visit in 3 days.   Specialties:  Family Medicine, Radiology   Contact information:   [address] [place] [address] [place] [address] [phone]        Consults obtained - Social work, case management  Discharge Condition: Fair  Diet and Activity recommendation: See Discharge Instructions below  Discharge Instructions       Discharge Instructions    Discharge instructions    Complete by:  As directed   Follow with Primary MD Roxy Manns, MD in 3 days. Review your head CT scan results with your family  physician within a week.   Get CBC, CMP, 2 view Chest X ray checked  by Primary MD next visit.    Activity: As tolerated with Full fall precautions use walker/cane & assistance as needed   Disposition Home     Diet:   Heart Healthy low carb.  For Heart failure patients - Check your Weight same time everyday, if you gain over 2 pounds, or you develop in leg swelling, experience more shortness of breath or chest pain, call your Primary MD immediately. Follow Cardiac Low Salt Diet and 1.5 lit/day fluid restriction.   On your next visit with your primary care physician please Get Medicines reviewed and adjusted.   Please request your Prim.MD to go over all Hospital Tests and Procedure/Radiological results at the follow up, please get all Hospital records sent to your Prim MD by signing hospital release before you go home.   If you experience worsening of your admission symptoms, develop shortness of breath, life threatening emergency, suicidal or homicidal thoughts you must seek medical attention immediately by calling 911 or calling your MD immediately  if symptoms less severe.  You Must read complete instructions/literature along with all the possible adverse reactions/side effects for all the Medicines you take and that have been prescribed to you. Take any new Medicines after you have completely understood and accpet all the possible adverse reactions/side effects.   Do not drive, operate heavy machinery, perform activities at heights, swimming or participation in water activities or provide baby sitting services if your were admitted for syncope or siezures until you have seen by Primary MD or a Neurologist and advised to do so again.  Do not drive when taking Pain medications.    Do not take more than prescribed Pain, Sleep and Anxiety Medications  Special Instructions: If you have smoked or chewed Tobacco  in the last 2 yrs please stop smoking, stop any regular Alcohol  and or any  Recreational drug use.  Wear Seat belts while driving.   Please note  You were cared for by a hospitalist during your hospital stay. If you have any questions about your discharge medications or the care you received while you were in the hospital after you are discharged, you can call the unit and asked to speak with the hospitalist on call if the hospitalist that took care of you is not available. Once you are discharged, your primary care physician will handle any further medical issues. Please note that NO REFILLS for any discharge medications will be authorized once you are discharged, as it is imperative that you return to your primary care physician (or establish a relationship with a primary care physician if you do not have one) for your aftercare needs so that they can reassess your need for  medications and monitor your lab values.     Increase activity slowly    Complete by:  As directed              Discharge Medications       Medication List    TAKE these medications        amitriptyline 100 MG tablet  Commonly known as:  ELAVIL  Take 200 mg by mouth every evening.     clonazePAM 0.5 MG tablet  Commonly known as:  KLONOPIN  Take 1 tablet (0.5 mg total) by mouth 2 (two) times daily as needed. anxiety     ibuprofen 200 MG tablet  Commonly known as:  ADVIL,MOTRIN  Take 200 mg by mouth every 6 (six) hours as needed for moderate pain.     levothyroxine 175 MCG tablet  Commonly known as:  SYNTHROID, LEVOTHROID  Take 175 mcg by mouth daily.     oxyCODONE 5 MG immediate release tablet  Commonly known as:  Oxy IR/ROXICODONE  Take 1 tablet (5 mg total) by mouth every 8 (eight) hours as needed. pain        Major procedures and Radiology Reports - PLEASE review detailed and final reports for all details, in brief -       X-ray Chest Pa And Lateral  12/09/2015  CLINICAL DATA:  Patient with possible medication overdose. EXAM: CHEST  2 VIEW COMPARISON:  A chest  radiograph 01/26/2009. FINDINGS: Stable cardiac and mediastinal contours. Bibasilar heterogeneous opacities. No pleural effusion or pneumothorax. Regional skeleton is unremarkable. Right shoulder joint degenerative changes. IMPRESSION: Bibasilar atelectasis.  No acute cardiopulmonary process. Electronically Signed   By: Annia Belt M.D.   On: 12/09/2015 08:41   Ct Head Wo Contrast  12/09/2015  CLINICAL DATA:  Patient took oxycodone surround 3 a.m. and then was found on the floor, slow to respond. EXAM: CT HEAD WITHOUT CONTRAST CT CERVICAL SPINE WITHOUT CONTRAST TECHNIQUE: Multidetector CT imaging of the head and cervical spine was performed following the standard protocol without intravenous contrast. Multiplanar CT image reconstructions of the cervical spine were also generated. COMPARISON:  None. FINDINGS: CT HEAD FINDINGS Ventricles and sulci appear symmetrical. No ventricular dilatation. No mass effect or midline shift. No abnormal extra-axial fluid collections. Gray-white matter junctions are distinct. Basal cisterns are not effaced. No evidence of acute intracranial hemorrhage. No depressed skull fractures. Mucosal thickening in the paranasal sinuses. No acute air-fluid levels. Mastoid air cells are not opacified. CT CERVICAL SPINE FINDINGS Normal alignment of the cervical spine. Degenerative changes throughout the cervical spine with narrowed interspaces and endplate hypertrophic changes. Degenerative changes in the facet joints. Cervical spine demonstrates heterogeneous sclerotic appearance with focal lucencies likely to represent degenerative changes. However, metabolic bone disease or metastatic disease could also have this appearance. Correlation with clinical history is recommended. If patient is at high risk for metastatic disease, consider bone scan for further evaluation. Erosive changes demonstrated at the clavicular heads and manubrium could also represent arthritic changes, infection, or  metastatic disease. No vertebral compression deformities. Posterior elements appear intact. No prevertebral soft tissue swelling. C1-2 articulation appears intact. Vascular calcifications. Soft tissues are unremarkable. IMPRESSION: No acute intracranial abnormalities. Cervical spine and sternoclavicular joints demonstrate sclerosis and lucent change possibly due to degenerative changes although infection, metabolic disease, or metastatic disease could also potentially have this appearance. Consider follow-up with bone scan if clinically indicated. No acute displaced cervical spine fractures identified. Electronically Signed   By: Marisa Cyphers.D.  On: 12/09/2015 05:29   Ct Cervical Spine Wo Contrast  12/09/2015  CLINICAL DATA:  Patient took oxycodone surround 3 a.m. and then was found on the floor, slow to respond. EXAM: CT HEAD WITHOUT CONTRAST CT CERVICAL SPINE WITHOUT CONTRAST TECHNIQUE: Multidetector CT imaging of the head and cervical spine was performed following the standard protocol without intravenous contrast. Multiplanar CT image reconstructions of the cervical spine were also generated. COMPARISON:  None. FINDINGS: CT HEAD FINDINGS Ventricles and sulci appear symmetrical. No ventricular dilatation. No mass effect or midline shift. No abnormal extra-axial fluid collections. Gray-white matter junctions are distinct. Basal cisterns are not effaced. No evidence of acute intracranial hemorrhage. No depressed skull fractures. Mucosal thickening in the paranasal sinuses. No acute air-fluid levels. Mastoid air cells are not opacified. CT CERVICAL SPINE FINDINGS Normal alignment of the cervical spine. Degenerative changes throughout the cervical spine with narrowed interspaces and endplate hypertrophic changes. Degenerative changes in the facet joints. Cervical spine demonstrates heterogeneous sclerotic appearance with focal lucencies likely to represent degenerative changes. However, metabolic bone  disease or metastatic disease could also have this appearance. Correlation with clinical history is recommended. If patient is at high risk for metastatic disease, consider bone scan for further evaluation. Erosive changes demonstrated at the clavicular heads and manubrium could also represent arthritic changes, infection, or metastatic disease. No vertebral compression deformities. Posterior elements appear intact. No prevertebral soft tissue swelling. C1-2 articulation appears intact. Vascular calcifications. Soft tissues are unremarkable. IMPRESSION: No acute intracranial abnormalities. Cervical spine and sternoclavicular joints demonstrate sclerosis and lucent change possibly due to degenerative changes although infection, metabolic disease, or metastatic disease could also potentially have this appearance. Consider follow-up with bone scan if clinically indicated. No acute displaced cervical spine fractures identified. Electronically Signed   By: Burman Nieves M.D.   On: 12/09/2015 05:29    Micro Results      No results found for this or any previous visit (from the past 240 hour(s)).     Today   Subjective    Taylor Benitez today has no headache,no chest abdominal pain,no new weakness tingling or numbness, feels much better wants to go home today.    Objective   Blood pressure 111/71, pulse 72, temperature 98.3 F (36.8 C), temperature source Oral, resp. rate 18, height 5\' 1"  (1.549 m), weight 66.633 kg (146 lb 14.4 oz), SpO2 93 %.   Intake/Output Summary (Last 24 hours) at 12/10/15 1000 Last data filed at 12/10/15 0905  Gross per 24 hour  Intake    240 ml  Output      0 ml  Net    240 ml    Exam Awake Alert, Oriented x 3, No new F.N deficits, Normal affect [place].AT,PERRAL Supple Neck,No JVD, No cervical lymphadenopathy appriciated.  Symmetrical Chest wall movement, Good air movement bilaterally, CTAB RRR,No Gallops,Rubs or new Murmurs, No Parasternal Heave +ve B.Sounds,  Abd Soft, Non tender, No organomegaly appriciated, No rebound -guarding or rigidity. No Cyanosis, Clubbing or edema, No new Rash or bruise   Data Review   CBC w Diff: Lab Results  Component Value Date   WBC 4.5 12/10/2015   HGB 10.2* 12/10/2015   HCT 33.5* 12/10/2015   PLT 131* 12/10/2015   LYMPHOPCT 17 12/09/2015   MONOPCT 4 12/09/2015   EOSPCT 1 12/09/2015   BASOPCT 0 12/09/2015    CMP: Lab Results  Component Value Date   NA 137 12/10/2015   K 3.7 12/10/2015   CL 108 12/10/2015  CO2 24 12/10/2015   BUN 13 12/10/2015   CREATININE 0.83 12/10/2015   PROT 5.6* 12/10/2015   ALBUMIN 2.9* 12/10/2015   BILITOT 0.3 12/10/2015   ALKPHOS 83 12/10/2015   AST 17 12/10/2015   ALT 12* 12/10/2015  .   Total Time in preparing paper work, data evaluation and todays exam - 35 minutes  Leroy Sea M.D on 12/10/2015 at 10:00 AM  [facility]   Office  [phone]

## 2015-12-11 ENCOUNTER — Telehealth: Payer: Self-pay

## 2015-12-11 NOTE — Telephone Encounter (Signed)
1st attempt - home phone number is no longer valid.   2nd attempt - attempted to contact emergency contact. Left message with provider info.

## 2015-12-12 NOTE — Telephone Encounter (Signed)
Able to leave message on cell phone, appointment cancelled, please call office.

## 2015-12-12 NOTE — Telephone Encounter (Signed)
Called pt cell, left message to call office," appointment tomorrow has been canceled, please call office asap [phone]" ask for [place],

## 2015-12-12 NOTE — Telephone Encounter (Signed)
Spoke with EC Judge Stall, she gave numbers to reach mom, could not disclose other information, no DPR on file.  Called 2 new numbers [phone] and [phone], No Answer.

## 2015-12-13 ENCOUNTER — Ambulatory Visit: Payer: Medicare Other | Admitting: Family Medicine

## 2016-01-09 DIAGNOSIS — F1190 Opioid use, unspecified, uncomplicated: Secondary | ICD-10-CM | POA: Insufficient documentation

## 2016-02-13 ENCOUNTER — Other Ambulatory Visit: Payer: Self-pay | Admitting: Pediatrics

## 2016-02-13 DIAGNOSIS — R918 Other nonspecific abnormal finding of lung field: Secondary | ICD-10-CM

## 2016-02-16 ENCOUNTER — Encounter: Payer: Self-pay | Admitting: Emergency Medicine

## 2016-02-16 ENCOUNTER — Emergency Department
Admission: EM | Admit: 2016-02-16 | Discharge: 2016-02-16 | Disposition: A | Discharge status: Home / Self Care | Payer: Medicare Other | Attending: Emergency Medicine | Admitting: Emergency Medicine

## 2016-02-16 DIAGNOSIS — F11120 Opioid abuse with intoxication, uncomplicated: Secondary | ICD-10-CM | POA: Insufficient documentation

## 2016-02-16 DIAGNOSIS — I10 Essential (primary) hypertension: Secondary | ICD-10-CM | POA: Diagnosis not present

## 2016-02-16 DIAGNOSIS — E119 Type 2 diabetes mellitus without complications: Secondary | ICD-10-CM | POA: Diagnosis not present

## 2016-02-16 DIAGNOSIS — F11129 Opioid abuse with intoxication, unspecified: Secondary | ICD-10-CM

## 2016-02-16 DIAGNOSIS — F17210 Nicotine dependence, cigarettes, uncomplicated: Secondary | ICD-10-CM | POA: Diagnosis not present

## 2016-02-16 DIAGNOSIS — R4182 Altered mental status, unspecified: Secondary | ICD-10-CM | POA: Diagnosis present

## 2016-02-16 DIAGNOSIS — G9340 Encephalopathy, unspecified: Secondary | ICD-10-CM | POA: Diagnosis not present

## 2016-02-16 LAB — CBC WITH DIFFERENTIAL/PLATELET
BASOS ABS: 0 10*3/uL (ref 0–0.1)
Eosinophils Absolute: 0.1 10*3/uL (ref 0–0.7)
Eosinophils Relative: 1 %
HEMATOCRIT: 36.2 % (ref 35.0–47.0)
HEMOGLOBIN: 12.4 g/dL (ref 12.0–16.0)
Lymphs Abs: 1.2 10*3/uL (ref 1.0–3.6)
MCH: 29.2 pg (ref 26.0–34.0)
MCHC: 34.3 g/dL (ref 32.0–36.0)
MCV: 85.2 fL (ref 80.0–100.0)
Monocytes Absolute: 0.3 10*3/uL (ref 0.2–0.9)
NEUTROS ABS: 6 10*3/uL (ref 1.4–6.5)
Platelets: 187 10*3/uL (ref 150–440)
RBC: 4.25 MIL/uL (ref 3.80–5.20)
RDW: 16.1 % — ABNORMAL HIGH (ref 11.5–14.5)
WBC: 7.6 10*3/uL (ref 3.6–11.0)

## 2016-02-16 LAB — COMPREHENSIVE METABOLIC PANEL
ALK PHOS: 86 U/L (ref 38–126)
ALT: 16 U/L (ref 14–54)
ANION GAP: 9 (ref 5–15)
AST: 42 U/L — ABNORMAL HIGH (ref 15–41)
Albumin: 4.2 g/dL (ref 3.5–5.0)
BUN: 19 mg/dL (ref 6–20)
CALCIUM: 8.9 mg/dL (ref 8.9–10.3)
CO2: 23 mmol/L (ref 22–32)
Chloride: 101 mmol/L (ref 101–111)
Creatinine, Ser: 0.87 mg/dL (ref 0.44–1.00)
GFR calc non Af Amer: >60 mL/min (ref >60)
Glucose, Bld: 182 mg/dL — ABNORMAL HIGH (ref 65–99)
POTASSIUM: 3.7 mmol/L (ref 3.5–5.1)
SODIUM: 133 mmol/L — AB (ref 135–145)
TOTAL PROTEIN: 7.3 g/dL (ref 6.5–8.1)
Total Bilirubin: 1.1 mg/dL (ref 0.3–1.2)

## 2016-02-16 LAB — URINE DRUG SCREEN, QUALITATIVE (ARMC ONLY)
AMPHETAMINES, UR SCREEN: NOT DETECTED
Barbiturates, Ur Screen: NOT DETECTED
Benzodiazepine, Ur Scrn: POSITIVE — AB
COCAINE METABOLITE, UR ~~LOC~~: NOT DETECTED
Cannabinoid 50 Ng, Ur ~~LOC~~: POSITIVE — AB
MDMA (ECSTASY) UR SCREEN: NOT DETECTED
METHADONE SCREEN, URINE: NOT DETECTED
OPIATE, UR SCREEN: POSITIVE — AB
PHENCYCLIDINE (PCP) UR S: NOT DETECTED
Tricyclic, Ur Screen: POSITIVE — AB

## 2016-02-16 LAB — URINALYSIS COMPLETE WITH MICROSCOPIC (ARMC ONLY)
BACTERIA UA: NONE SEEN
Bilirubin Urine: NEGATIVE
Glucose, UA: NEGATIVE mg/dL
KETONES UR: NEGATIVE mg/dL
LEUKOCYTES UA: NEGATIVE
Nitrite: NEGATIVE
PH: 6 (ref 5.0–8.0)
PROTEIN: 30 mg/dL — AB
Specific Gravity, Urine: 1.006 (ref 1.005–1.030)

## 2016-02-16 LAB — ETHANOL: Alcohol, Ethyl (B): <5 mg/dL (ref <5)

## 2016-02-16 LAB — ACETAMINOPHEN LEVEL

## 2016-02-16 LAB — SALICYLATE LEVEL

## 2016-02-16 MED ORDER — SODIUM CHLORIDE 0.9 % IV BOLUS (SEPSIS)
1000.0000 mL | Freq: Once | INTRAVENOUS | Status: AC
  Administered 2016-02-16: 1000 mL via INTRAVENOUS

## 2016-02-16 NOTE — ED Notes (Signed)
Patients roommate is requesting to have her number left to be called when patient is ready to be discharged.  [employer] Cell: [phone] Home: [phone]

## 2016-02-16 NOTE — ED Provider Notes (Signed)
[facility] Emergency Department Provider Note  ____________________________________________  Time seen: Approximately 11:37 AM  I have reviewed the triage vital signs and the nursing notes.   HISTORY  Chief Complaint Altered Mental Status   HPI Taylor Benitez is a 55 y.o. female a history of OA, hypothyroidism, hypertension, diabetes, opioid dependence, and anxiety who was brought into the emergency department by EMS for altered mental status. EMS was called by patient's roommates. Patient was less responsive this morning and had a bottle of empty Listerine and multiple bottles of narcotics at the bedside. Patient is alert and oriented however slurring her speech at this time. She tells me she took a 1.5 mg Clonopin and 5 x 5mg  oxycodone this morning. She is unable to tell me how many pills she took within the last 24 hours. She also reports that she used one small bottle of Listerine over the course of 14 days as she has very poor dentition for oral hygiene however denies swallowing it. She denies HA, CP, SOB, abdominal pain, N/V/D, dysuria. She has been hospitalized before for accidental OD on opiates. She denies any other drugs, denies alcohol intake. She denies SI or HI.   Past Medical History:  Diagnosis Date  . Arthritis    rhumatoid  . Hypertension   . Thyroid disease     Patient Active Problem List   Diagnosis Date Noted  . Narcotic overdose   . Toxic encephalopathy 12/09/2015  . Overdose 12/09/2015  . Thyroid disease 12/09/2015  . Diabetes (HCC) 12/09/2015  . Arthritis 12/09/2015  . NONSPECIFIC ABN FINDING RAD & OTH EXAM GI TRACT 01/30/2009  . WHEEZING 06/30/2007    Past Surgical History:  Procedure Laterality Date  . COLON SURGERY     colon abcess    Prior to Admission medications   Medication Sig Start Date End Date Taking? Authorizing Provider  amitriptyline (ELAVIL) 100 MG tablet Take 200 mg by mouth every evening. 11/08/15 11/07/16   Historical Provider, MD  clonazePAM (KLONOPIN) 0.5 MG tablet Take 1 tablet (0.5 mg total) by mouth 2 (two) times daily as needed. anxiety 12/10/15   12/12/15, MD  ibuprofen (ADVIL,MOTRIN) 200 MG tablet Take 200 mg by mouth every 6 (six) hours as needed for moderate pain.    Historical Provider, MD  levothyroxine (SYNTHROID, LEVOTHROID) 175 MCG tablet Take 175 mcg by mouth daily. 09/17/15   Historical Provider, MD  oxyCODONE (OXY IR/ROXICODONE) 5 MG immediate release tablet Take 1 tablet (5 mg total) by mouth every 8 (eight) hours as needed. pain 12/10/15   12/12/15, MD    Allergies Penicillins  History reviewed. No pertinent family history.  Social History Social History  Substance Use Topics  . Smoking status: Current Every Day Smoker    Types: E-cigarettes, Cigarettes  . Smokeless tobacco: Not on file  . Alcohol use No     Comment: Glass of Wine now and then 1 once 1-2 weekd    Review of Systems  Constitutional: Negative for fever. Eyes: Negative for visual changes. ENT: Negative for sore throat. Cardiovascular: Negative for chest pain. Respiratory: Negative for shortness of breath. Gastrointestinal: Negative for abdominal pain, vomiting or diarrhea. Genitourinary: Negative for dysuria. Musculoskeletal: Negative for back pain. Skin: Negative for rash. Neurological: Negative for headaches, weakness or numbness. + slurred speech  ____________________________________________   PHYSICAL EXAM:  VITAL SIGNS: ED Triage Vitals  Enc Vitals Group     BP 02/16/16 1119 130/64  Pulse Rate 02/16/16 1119 85     Resp 02/16/16 1119 14     Temp 02/16/16 1119 97.3 F (36.3 C)     Temp Source 02/16/16 1119 Oral     SpO2 02/16/16 1119 98 %     Weight 02/16/16 1120 154 lb (69.9 kg)     Height 02/16/16 1120 5\' 4"  (1.626 m)     Head Circumference --      Peak Flow --      Pain Score --      Pain Loc --      Pain Edu? --      Excl. in GC? --     Constitutional:  Alert and oriented, slurring and slow to answer questions but answering appropriately. No distress HEENT:      Head: Normocephalic and atraumatic.         Eyes: Conjunctivae are normal. Sclera is non-icteric. EOMI. PERRL 33mm      Mouth/Throat: Mucous membranes are dry. Poor dentition       Neck: Supple with no signs of meningismus. Cardiovascular: Regular rate and rhythm. No murmurs, gallops, or rubs. 2+ symmetrical distal pulses are present in all extremities. No JVD. Respiratory: Normal respiratory effort. Lungs are clear to auscultation bilaterally. No wheezes, crackles, or rhonchi.  Gastrointestinal: Soft, non tender, and non distended with positive bowel sounds. No rebound or guarding. Genitourinary: No CVA tenderness. Musculoskeletal: Nontender with normal range of motion in all extremities. No edema, cyanosis, or erythema of extremities. Neurologic: Slurred speech, normal language. Face is symmetric. Moving all extremities. No gross focal neurologic deficits are appreciated. Skin: Skin is warm, dry and intact. No rash noted. Psychiatric: Mood and affect are normal. Speech and behavior are normal.  ____________________________________________   LABS (all labs ordered are listed, but only abnormal results are displayed)  Labs Reviewed  ACETAMINOPHEN LEVEL - Abnormal; Notable for the following:       Result Value   Acetaminophen (Tylenol), Serum <10 (*)    All other components within normal limits  COMPREHENSIVE METABOLIC PANEL - Abnormal; Notable for the following:    Sodium 133 (*)    Glucose, Bld 182 (*)    AST 42 (*)    All other components within normal limits  CBC WITH DIFFERENTIAL/PLATELET - Abnormal; Notable for the following:    RDW 16.1 (*)    All other components within normal limits  URINE DRUG SCREEN, QUALITATIVE ([facility] ONLY) - Abnormal; Notable for the following:    Tricyclic, Ur Screen POSITIVE (*)    Opiate, Ur Screen POSITIVE (*)    Cannabinoid 50 Ng, Ur [place]  POSITIVE (*)    Benzodiazepine, Ur Scrn POSITIVE (*)    All other components within normal limits  URINALYSIS COMPLETEWITH MICROSCOPIC ([facility] ONLY) - Abnormal; Notable for the following:    Color, Urine YELLOW (*)    APPearance CLEAR (*)    Hgb urine dipstick 1+ (*)    Protein, ur 30 (*)    Squamous Epithelial / LPF 0-5 (*)    All other components within normal limits  SALICYLATE LEVEL  ETHANOL   ____________________________________________  EKG  ED ECG REPORT I, 3m, the attending physician, personally viewed and interpreted this ECG.  Normal sinus rhythm, rate of 82, normal intervals, normal axis, no ST elevations or depressions, T-wave inversions in V2 and V3. Unchanged from prior ____________________________________________  RADIOLOGY  None  ____________________________________________   PROCEDURES  Procedure(s) performed: None Procedures Critical Care performed:  None ____________________________________________  INITIAL IMPRESSION / ASSESSMENT AND PLAN / ED COURSE  55 y.o. female a history of OA, hypothyroidism, hypertension, diabetes, opioid dependence, and anxiety who was brought into the emergency department by EMS for altered mental status and slurred speech. Found with an empty bottle of Listerine at the bed side which patient tells me she used over the course of 2 weeks for oral hygiene and denies ingesting it. Has been taking large doses of oxycodone and klonopin. Pupils 76mm and reactive, neuro intact other than slurred speech, looks intoxicated. Patient is maintaining her airway, normal vital signs. Will watch on telemetry, check EKG, labs, salicylate and Tylenol levels, tox screen, we'll give her IV fluids.   Clinical Course  Comment By Time  Labs WNL with no anion gap, no acidosis. Negative salicylate, Tylenol, and alcohol levels. UA with no evidence of infection. Urine drug screen positive for tricyclics, opiates, marijuana, and  benzodiazepines. Patient remained stable however still looks intoxicated. We'll continue to watch and 2 patient is back to baseline. Anticipate discharge. Nita Sickle, MD 08/26 1356  Patient clinically sober at this time. VS stable. Fully awake, on her phone. Tolerating PO, ambulating. I asked patient to call a friend to come get her. I also explained to the patient that taking more than what she is prescribed of her medications is very concerning. She may stop breathing and die from this. I encouraged her to take medications as they are prescribed to her. Patient expresses understanding of my concerns and reports that she will not take more than prescribed. Will discharge home. Nita Sickle, MD 08/26 1614    Pertinent labs & imaging results that were available during my care of the patient were reviewed by me and considered in my medical decision making (see chart for details).    ____________________________________________   FINAL CLINICAL IMPRESSION(S) / ED DIAGNOSES  Final diagnoses:  Acute encephalopathy  Opioid abuse with intoxication (HCC)      NEW MEDICATIONS STARTED DURING THIS VISIT:  New Prescriptions   No medications on file     Note:  This document was prepared using Dragon voice recognition software and may include unintentional dictation errors.    Nita Sickle, MD 02/17/16 1115

## 2016-02-16 NOTE — ED Notes (Signed)
Patient falling asleep in triage wheelchair, easily rousable

## 2016-02-16 NOTE — ED Notes (Signed)
Patient notified of need of urine sample. Patient placed on bedpan but unable to give sample at this time. Will try again later.

## 2016-02-16 NOTE — ED Notes (Signed)
MD aware of respiration rate.

## 2016-02-16 NOTE — ED Triage Notes (Signed)
Patient from home via [facility] with c/o AMS per family. Patient is able to Answer all questions appropriately however, Is lethargic in triage. EMS states possibility of Listerine ingestion, and accidental overdose on prescription narcotics for RA. EMS states that patient has had multiple refills recently of prescribed narcotics.

## 2016-02-16 NOTE — ED Notes (Signed)
In and out cath completed by this RN. Assisted by Jeannett Senior, RN. Patient tolerated well. Clear, yellow urine returned. Sterile technique maintained.

## 2016-02-16 NOTE — Discharge Instructions (Signed)
Take your medications as prescribed. Taking more than what is prescribed to you can cause you to stop breathing a die. Follow up with your doctor in 1 week. Return to the emergency department for chest pain, headache, difficulty breathing, abdominal pain, or any other symptoms concerning to you

## 2016-03-03 ENCOUNTER — Ambulatory Visit: Admission: RE | Admit: 2016-03-03 | Payer: Medicare Other | Source: Ambulatory Visit

## 2016-11-14 DIAGNOSIS — K56609 Unspecified intestinal obstruction, unspecified as to partial versus complete obstruction: Secondary | ICD-10-CM | POA: Insufficient documentation

## 2016-11-14 DIAGNOSIS — Z8719 Personal history of other diseases of the digestive system: Secondary | ICD-10-CM | POA: Insufficient documentation

## 2016-11-17 DIAGNOSIS — K7469 Other cirrhosis of liver: Secondary | ICD-10-CM | POA: Insufficient documentation

## 2016-11-19 DIAGNOSIS — Z8719 Personal history of other diseases of the digestive system: Secondary | ICD-10-CM

## 2016-11-19 HISTORY — PX: EXPLORATORY LAPAROTOMY: SUR591

## 2016-11-19 HISTORY — DX: Personal history of other diseases of the digestive system: Z87.19

## 2016-11-21 DIAGNOSIS — K7460 Unspecified cirrhosis of liver: Secondary | ICD-10-CM

## 2016-11-21 HISTORY — DX: Unspecified cirrhosis of liver: K74.60

## 2017-06-23 HISTORY — PX: TOTAL HIP ARTHROPLASTY: SHX124

## 2019-03-28 ENCOUNTER — Encounter: Payer: Self-pay | Admitting: Family Medicine

## 2019-03-28 ENCOUNTER — Ambulatory Visit (INDEPENDENT_AMBULATORY_CARE_PROVIDER_SITE_OTHER): Payer: Medicare Other | Admitting: Family Medicine

## 2019-03-28 ENCOUNTER — Other Ambulatory Visit: Payer: Self-pay

## 2019-03-28 VITALS — BP 102/62 | HR 96 | Temp 97.9°F | Ht 61.25 in | Wt 131.4 lb

## 2019-03-28 DIAGNOSIS — N390 Urinary tract infection, site not specified: Secondary | ICD-10-CM

## 2019-03-28 DIAGNOSIS — E079 Disorder of thyroid, unspecified: Secondary | ICD-10-CM | POA: Diagnosis not present

## 2019-03-28 DIAGNOSIS — E559 Vitamin D deficiency, unspecified: Secondary | ICD-10-CM

## 2019-03-28 DIAGNOSIS — R634 Abnormal weight loss: Secondary | ICD-10-CM

## 2019-03-28 DIAGNOSIS — Z23 Encounter for immunization: Secondary | ICD-10-CM | POA: Diagnosis not present

## 2019-03-28 DIAGNOSIS — E118 Type 2 diabetes mellitus with unspecified complications: Secondary | ICD-10-CM | POA: Diagnosis not present

## 2019-03-28 DIAGNOSIS — D649 Anemia, unspecified: Secondary | ICD-10-CM

## 2019-03-28 DIAGNOSIS — Z7689 Persons encountering health services in other specified circumstances: Secondary | ICD-10-CM | POA: Diagnosis not present

## 2019-03-28 DIAGNOSIS — E889 Metabolic disorder, unspecified: Secondary | ICD-10-CM

## 2019-03-28 DIAGNOSIS — G63 Polyneuropathy in diseases classified elsewhere: Secondary | ICD-10-CM

## 2019-03-28 LAB — COMPREHENSIVE METABOLIC PANEL
ALT: 20 U/L (ref 0–35)
AST: 22 U/L (ref 0–37)
Albumin: 4.6 g/dL (ref 3.5–5.2)
Alkaline Phosphatase: 117 U/L (ref 39–117)
BUN: 20 mg/dL (ref 6–23)
CO2: 28 mEq/L (ref 19–32)
Calcium: 10.1 mg/dL (ref 8.4–10.5)
Chloride: 101 mEq/L (ref 96–112)
Creatinine, Ser: 1 mg/dL (ref 0.40–1.20)
GFR: 56.74 mL/min — ABNORMAL LOW (ref >60.00)
Glucose, Bld: 65 mg/dL — ABNORMAL LOW (ref 70–99)
Potassium: 4.1 mEq/L (ref 3.5–5.1)
Sodium: 139 mEq/L (ref 135–145)
Total Bilirubin: 0.6 mg/dL (ref 0.2–1.2)
Total Protein: 7.5 g/dL (ref 6.0–8.3)

## 2019-03-28 LAB — CBC WITH DIFFERENTIAL/PLATELET
Basophils Absolute: 0 10*3/uL (ref 0.0–0.1)
Basophils Relative: 0.6 % (ref 0.0–3.0)
Eosinophils Absolute: 0 10*3/uL (ref 0.0–0.7)
Eosinophils Relative: 0.6 % (ref 0.0–5.0)
HCT: 38 % (ref 36.0–46.0)
Hemoglobin: 12.5 g/dL (ref 12.0–15.0)
Lymphocytes Relative: 28.8 % (ref 12.0–46.0)
Lymphs Abs: 2.2 10*3/uL (ref 0.7–4.0)
MCHC: 32.8 g/dL (ref 30.0–36.0)
MCV: 87 fl (ref 78.0–100.0)
Monocytes Absolute: 0.3 10*3/uL (ref 0.1–1.0)
Monocytes Relative: 3.4 % (ref 3.0–12.0)
Neutro Abs: 5.2 10*3/uL (ref 1.4–7.7)
Neutrophils Relative %: 66.6 % (ref 43.0–77.0)
Platelets: 273 10*3/uL (ref 150.0–400.0)
RBC: 4.37 Mil/uL (ref 3.87–5.11)
RDW: 15.2 % (ref 11.5–15.5)
WBC: 7.7 10*3/uL (ref 4.0–10.5)

## 2019-03-28 LAB — VITAMIN D 25 HYDROXY (VIT D DEFICIENCY, FRACTURES): VITD: 29.64 ng/mL — ABNORMAL LOW (ref 30.00–100.00)

## 2019-03-28 LAB — HEMOGLOBIN A1C: Hgb A1c MFr Bld: 6.8 % — ABNORMAL HIGH (ref 4.6–6.5)

## 2019-03-28 LAB — TSH: TSH: 0.49 u[IU]/mL (ref 0.35–4.50)

## 2019-03-28 NOTE — Patient Instructions (Signed)
Good to meet you both today  I will send in refills to your pharmacy for your diabetes medications and your levothyroxine once your labs are back.   Please schedule an appointment for well woman exam for 4-6 weeks

## 2019-03-28 NOTE — Progress Notes (Signed)
Subjective:    Patient ID: Taylor Benitez, female    DOB: 1961/05/12, 58 y.o.   MRN: [id number]  HPI This is a 58 yo female, accompanied by her daughter, who presents today to establish care. Recently moved to area from [place]. Lives with her elderly mother and brother.  Her daughter lives nearby.   Last CPE-several years Mammo- 2012 Pap- 2005 Colonoscopy- per pt 2012 Tdap-05/01/2010 Flu-annual, will have today Eye- 2005 Dental- 2017- all teeth extracted, does not have dentures Exercise-none due to difficulty with mobility  Myxedema- patient brings some records with her today from stay at skilled nursing facility following her hospital admission.  She is been admitted to the hospital with suspected small bowel obstruction and at that time her TSH was greater than 200 and free T3 was less than 0.5.  She had been off of her levothyroxine for many months.  Peripheral neuropathy- for years, in hands and feet. Unable to take gabapentin, had difficulty urinating, had to stop.   Diabetes mellitus- eats lots of crackers, soups, unable to drive. Doesn't check her blood sugars at home. Has not been taking metformin, causes bad digestive problems- burping, gas.  Has been taking glipizide 5 mg twice a day.  Unintentional weight loss- 10 # in last 6 weeks.  According to the record she brings today, she is down about 30 pounds since the middle of August.  UTI- at last hospitalization, some incontinence until last week.   Pain management- has upcoming appointment with [facility].  Was previously followed by [facility] clinic in [place].  Past Medical History:  Diagnosis Date  . Arthritis    rhumatoid  . Diabetes ([place])   . Hypertension   . Thyroid disease    Past Surgical History:  Procedure Laterality Date  . COLON SURGERY     colon abcess  . COLOSTOMY  2006  . KNEE SURGERY Bilateral    total knee replacement  . SMALL INTESTINE SURGERY     bowel obstruction x 2  . TOTAL HIP  ARTHROPLASTY Right 2019   Family History  Problem Relation Age of Onset  . Arthritis Mother   . Depression Mother   . Diabetes Mother   . Hearing loss Mother   . Stroke Mother   . Cancer Father   . Diabetes Father   . Heart attack Father   . Heart disease Father   . Stroke Father   . Depression Sister   . Drug abuse Sister   . Depression Brother   . Arthritis Maternal Grandmother   . Cancer Maternal Grandfather   . COPD Maternal Grandfather   . Depression Maternal Grandfather   . Depression Paternal Grandmother   . Hyperlipidemia Paternal Grandmother   . Heart disease Paternal Grandmother    Social History   Tobacco Use  . Smoking status: Current Every Day Smoker    Types: E-cigarettes, Cigarettes  . Smokeless tobacco: Never Used  Substance Use Topics  . Alcohol use: No    Comment: Glass of Wine now and then 1 once 1-2 weekd  . Drug use: Not on file      Review of Systems Per HPI    Objective:   Physical Exam Vitals signs reviewed.  Constitutional:      General: She is not in acute distress.    Appearance: She is normal weight. She is ill-appearing (appears older than stated age and chronically ill appearing). She is not toxic-appearing or diaphoretic.  HENT:  Head: Normocephalic and atraumatic.  Eyes:     Conjunctiva/sclera: Conjunctivae normal.  Cardiovascular:     Rate and Rhythm: Normal rate and regular rhythm.     Heart sounds: Normal heart sounds.  Pulmonary:     Effort: Pulmonary effort is normal.     Breath sounds: Normal breath sounds.  Neurological:     Mental Status: She is alert.       BP 102/62 (BP Location: Left Arm, Patient Position: Sitting, Cuff Size: Normal)   Pulse 96   Temp 97.9 F (36.6 C) (Temporal)   Ht 5' 1.25" (1.556 m)   Wt 131 lb 6.4 oz (59.6 kg)   SpO2 96%   BMI 24.63 kg/m  Depression screen [facility] 2/9 03/28/2019  Decreased Interest 0  Down, Depressed, Hopeless 0  PHQ - 2 Score 0       Assessment & Plan:  1.  Encounter to establish care -Patient without regular health care for many months, will have her return in 6-8 weeks for CPE and to catch her up on health maintenance issues  2. Thyroid disorder -Reviewed importance of daily levothyroxine and importance of keeping thyroid regulated - TSH  3. UTI (urinary tract infection), uncomplicated -Unfortunately, she was unable to void today.  She was encouraged to return to leave urine sample - CBC with Differential - Urinalysis with Culture, if indicated; Future  4. Controlled type 2 diabetes mellitus with complication, without long-term current use of insulin (HCC) - Hemoglobin A1c - Comprehensive metabolic panel - Microalbumin / creatinine urine ratio; Future  5. Vitamin D deficiency - VITAMIN D 25 Hydroxy (Vit-D Deficiency, Fractures)  6. 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 Anemia, unspecified type - CBC  8. Need for influenza vaccination - Flu Vaccine QUAD 6+ mos PF IM (Fluarix Quad PF)  9. Unintentional weight loss -Likely related to resumption of levothyroxine, will continue to monitor -She does have a fairly poor diet and this was discussed with her  Over 45 minutes were spent face-to-face with the patient during this encounter and >50% of that time was spent on counseling and coordination of care  Olean Ree, FNP-BC  [facility] [place], [facility]  04/01/2019 7:54 AM

## 2019-03-28 NOTE — Progress Notes (Signed)
New Patient Office Visit  Subjective:  Patient ID: Taylor Benitez, female    DOB: August 29, 1960  Age: 58 y.o. MRN: [id number]  CC: No chief complaint on file.   HPI Taylor Benitez is 58 yo female presents today to establish healthcare provider. Pt is accompanied by her daughter. Pt lives with mother and brother. Pt has not been able to drive due to peripheral neuropathy. Increased neuropathy in all extremities especially in pt's hands in the past 4-5 months. Pt's weighted around 140 lbs after her hospital. In discharge in September,her weight today is 131. Weight loss is unattnetional. Pt rarely checks her blood sugar at home. Pt stopped to take her metformin due to digestion problems (such as gas, burping).   Last CPE- unknown Diet- lots of snack during the day crackers, poptarts, can soups Exercise- no exercise, pt uses walker and cane  Dental-2917, all teeth extraction Eye- 2015 Sleep- around 9 hours  Flu-pt is willing to take it today  Pt had UTI in the past and feel like her urine incontinence might be related to UTI again   Past Medical History:  Diagnosis Date  . Arthritis    rhumatoid  . Hypertension   . Thyroid disease     Past Surgical History:  Procedure Laterality Date  . COLON SURGERY     colon abcess    No family history on file.  Social History   Socioeconomic History  . Marital status: Single    Spouse name: Not on file  . Number of children: Not on file  . Years of education: Not on file  . Highest education level: Not on file  Occupational History  . Not on file  Social Needs  . Financial resource strain: Not on file  . Food insecurity    Worry: Not on file    Inability: Not on file  . Transportation needs    Medical: Not on file    Non-medical: Not on file  Tobacco Use  . Smoking status: Current Every Day Smoker    Types: E-cigarettes, Cigarettes  Substance and Sexual Activity  . Alcohol use: No    Comment: Glass of Wine now and then 1  once 1-2 weekd  . Drug use: Not on file  . Sexual activity: Not Currently  Lifestyle  . Physical activity    Days per week: Not on file    Minutes per session: Not on file  . Stress: Not on file  Relationships  . Social Musician on phone: Not on file    Gets together: Not on file    Attends religious service: Not on file    Active member of club or organization: Not on file    Attends meetings of clubs or organizations: Not on file    Relationship status: Not on file  . Intimate partner violence    Fear of current or ex partner: Not on file    Emotionally abused: Not on file    Physically abused: Not on file    Forced sexual activity: Not on file  Other Topics Concern  . Not on file  Social History Narrative  . Not on file    ROS Review of Systems  Constitutional: Negative for activity change, appetite change and fever.  HENT: Negative for congestion and ear pain.   Eyes: Negative for pain and discharge.  Respiratory: Negative for chest tightness, shortness of breath and wheezing.   Cardiovascular: Negative for chest pain,  palpitations and leg swelling.  Gastrointestinal: Negative for abdominal pain, nausea and vomiting.  Endocrine: Negative for polydipsia, polyphagia and polyuria.  Genitourinary: Positive for frequency.  Musculoskeletal: Positive for arthralgias and gait problem.  Skin:       Dry peeling skin on bilateral lower extremities  Neurological: Positive for weakness and numbness.  Hematological: Negative.     Objective:   Today's Vitals: There were no vitals taken for this visit.  Physical Exam HENT:     Head: Normocephalic and atraumatic.  Cardiovascular:     Rate and Rhythm: Normal rate.     Pulses: Normal pulses.     Heart sounds: Normal heart sounds.  Pulmonary:     Effort: Pulmonary effort is normal.     Breath sounds: Normal breath sounds.  Abdominal:     General: Bowel sounds are normal.     Palpations: Abdomen is soft.   Musculoskeletal:     Right lower leg: No edema.     Left lower leg: No edema.  Feet:     Right foot:     Protective Sensation: 7 sites tested. 3 sites sensed.     Skin integrity: Dry skin present.     Left foot:     Protective Sensation: 7 sites tested. 0 sites sensed.     Skin integrity: Dry skin present.  Skin:    General: Skin is dry.  Neurological:     Mental Status: She is alert and oriented to person, place, and time.     Motor: Weakness present.     Gait: Gait abnormal.     Deep Tendon Reflexes: Reflexes normal.  Psychiatric:        Mood and Affect: Mood normal.        Behavior: Behavior normal.    BP 102/62 (BP Location: Left Arm, Patient Position: Sitting, Cuff Size: Normal)   Pulse 96   Temp 97.9 F (36.6 C) (Temporal)   Ht 5' 1.25" (1.556 m)   Wt 59.6 kg   SpO2 96%   BMI 24.63 kg/m  Assessment & Plan:   1. Thyroid disorder Establishing the level on the current dose of medication - TSH  2. UTI (urinary tract infection), uncomplicated - CBC with Differential - Urinalysis  3. Controlled type 2 diabetes mellitus with complication, without long-term current use of insulin (HCC) - Hemoglobin A1c - Comprehensive metabolic panel - Microalbumin/Creatinine Ratio, Urine  4. Vitamin D deficiency - VITAMIN D 25 Hydroxy (Vit-D Deficiency, Fractures)  5. 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 Anemia, unspecified type Anemia is in the past, checking   Problem List Items Addressed This Visit    None      Outpatient Encounter Medications as of 03/28/2019  Medication Sig  . amitriptyline (ELAVIL) 100 MG tablet Take 200 mg by mouth every evening.  . clonazePAM (KLONOPIN) 0.5 MG tablet Take 1 tablet (0.5 mg total) by mouth 2 (two) times daily as needed. anxiety  . ibuprofen (ADVIL,MOTRIN) 200 MG tablet Take 200 mg by mouth every 6 (six) hours as needed for  moderate pain.  Marland Kitchen levothyroxine (SYNTHROID, LEVOTHROID) 175 MCG tablet Take 175 mcg by mouth daily.  Marland Kitchen oxyCODONE (OXY IR/ROXICODONE) 5 MG immediate release tablet Take 1 tablet (5 mg total) by mouth every 8 (eight) hours as needed. pain   No facility-administered encounter medications on file as of 03/28/2019.     Follow-up:  in 4-6 weeks   Rica Koyanagi, RN

## 2019-04-01 ENCOUNTER — Encounter: Payer: Self-pay | Admitting: Family Medicine

## 2019-04-01 ENCOUNTER — Telehealth: Payer: Self-pay | Admitting: Family Medicine

## 2019-04-01 NOTE — Telephone Encounter (Signed)
Left message asking pt to call office   Please call the patient and her daughter and schedule her a follow-up for 6 to 8 weeks for CPE.   Please let pt know they need to keep follow up appointments    Please tell them that if they are unwilling to have regular follow-up then she will not be seen at this practice.  Thanks,  [employer]

## 2019-04-05 ENCOUNTER — Encounter: Payer: Self-pay | Admitting: Family Medicine

## 2019-04-05 NOTE — Telephone Encounter (Signed)
Tried calling pt home and cell phone number could not leave message.   Left message on Taylor Benitez pt daughter phone to call office also mailed letter.   See Taylor Benitez comments

## 2019-04-07 ENCOUNTER — Other Ambulatory Visit: Payer: Self-pay

## 2019-04-07 NOTE — Telephone Encounter (Signed)
Pt left v/m that her meds have not been called in to pharmacy. Pt has had labs but has not had transportation to come to office for a urine specimen. Pt request refill on all her meds to [facility]. Pt request cb to her daughter. See phone note 04/01/19.

## 2019-04-08 NOTE — Telephone Encounter (Signed)
ATC no answer, voicemail not set up, unable to leave a message LM on daughters number listed in call back information [phone]  Need to know what prescriptions are needed Patient is not scheduled for any upcoming appts and was advised previously that she needed to schedule a CPE, not seen in a while

## 2019-04-11 NOTE — Telephone Encounter (Signed)
Pt left v/m that she needs her levothyroxine(none left),amitriptyline and could not remember names of other 2 meds needed to be refilled. Pt cannot get transportation yet to come to [facility] for urine and request meds sent to [facility].

## 2019-04-12 MED ORDER — LEVOTHYROXINE SODIUM 150 MCG PO TABS: 150.0000 ug | ORAL_TABLET | Freq: Every day | ORAL | 1 refills | Status: DC

## 2019-04-12 MED ORDER — AMITRIPTYLINE HCL 100 MG PO TABS: 200.0000 mg | ORAL_TABLET | Freq: Every evening | ORAL | 1 refills | Status: DC

## 2019-04-12 MED ORDER — GLIPIZIDE 5 MG PO TABS: 5.0000 mg | ORAL_TABLET | Freq: Every day | ORAL | 1 refills | Status: DC

## 2019-04-12 NOTE — Telephone Encounter (Signed)
I spoke with pt and notified pt refills were sent electronically for glipizide and amitriptyline to [facility]. Pt voiced understanding.

## 2019-04-12 NOTE — Telephone Encounter (Signed)
Patient left a voicemail stating that she is out of her medications and has tried to get them for several days. Patient stated that she needs her levothyroxine, glipizide and her amitriptyline. Patient stated that she is completely out of these medications. Patient stated that she will come to the office to do a urine test when she can get a ride. Patient stated that the pharmacy will deliver her medications to her. Pharmacy [facility]

## 2019-04-12 NOTE — Telephone Encounter (Signed)
Pt's daughter is wondering why her prescriptions has not be sent in? She needs ALL her prescriptions.   CB [phone] Her grandmother's number

## 2019-04-12 NOTE — Telephone Encounter (Signed)
I spoke with pt to let her know that Ashtyn sent into [facility] levothyroxin but Glenda Chroman FNP needs to know how pt is taking glipizide and what mg is the glipizide. Pt is taking glipizide 5mg   with instructions  One tab po at breakfast daily. Pt said does not say 24 hr tablet or glucotrol XL anywhere on the bottle.last filled # 30 on 03/11/19. Pt also request refill of amitriptyline 100 mg with instructions on med list to [place]. Sending note to Glenda Chroman FNP. Pt was advised that she needs to schedule annual visit for Dec 2020 before more refills pt voiced understanding and will cb about scheduling the appt.

## 2019-04-12 NOTE — Telephone Encounter (Signed)
Glipizide Rx sent to pharmacy  Awaiting Debbie's approval for Elavil

## 2019-04-12 NOTE — Telephone Encounter (Signed)
Pt is due for annual visit in December 2020 - no further refills until seen  [facility]  *We do not have the Glipizide on her med list so we need to know what dose she is taking.   *Also no mention from [place] in the Andrew notes of continuing Elavil - we have never prescribed this so she will have to give the okay to refill.   Rena, Please call the patient and see what dose of Glipizide and instructions she is taking. Thanks.   ______________  Jackelyn Poling please advise if okay to refill Elavil -- looks as though at last OV the Rx was ended off current medlist d/t patient stating she was no longer taking.

## 2019-04-12 NOTE — Telephone Encounter (Signed)
pts mom (do not see on DPR) left v/m that she wants cb today and wants medications refilled today. In the last note request glipzide refill; I do not see on current or hx med list and I do not see mentioned in office note on 03/28/19 but it is mentioned in 03/28/19 lab result note. Please advise.sending new message and skype message to Ashtyn also.

## 2019-04-13 NOTE — Telephone Encounter (Signed)
Spoke with Daughter Lexine Baton  She didn't want to schedule at this time.  She stated she was going to have interview and would be starting work and needed to see what her schedule would be.  I told Lexine Baton it was very important that pt schedule and keep appointment.  She stated she would go see her mom and send request through my chart to schedule.  nikki declined to schedule appointment

## 2019-04-13 NOTE — Telephone Encounter (Signed)
Noted  

## 2019-04-13 NOTE — Telephone Encounter (Signed)
Left message on cell phone asking pt to call office °

## 2019-04-13 NOTE — Telephone Encounter (Signed)
Pt called back and scheduled appointments 11/23 medicare wellness nurse 11/30 cpx labs 12/2 cpx with debbie  Pt is aware that it is very important that she keep these appointment for continuity of care

## 2019-05-12 ENCOUNTER — Telehealth: Payer: Self-pay

## 2019-05-12 NOTE — Telephone Encounter (Signed)
LVM w COVID screen, front door and back lab in fo 11.19.2020 TLJ 

## 2019-05-16 ENCOUNTER — Ambulatory Visit: Payer: Medicare Other

## 2019-05-23 ENCOUNTER — Other Ambulatory Visit: Payer: Medicare Other

## 2019-05-25 ENCOUNTER — Encounter: Payer: Medicare Other | Admitting: Family Medicine

## 2019-05-31 ENCOUNTER — Other Ambulatory Visit: Payer: Self-pay | Admitting: Family Medicine

## 2019-05-31 DIAGNOSIS — R202 Paresthesia of skin: Secondary | ICD-10-CM

## 2019-05-31 DIAGNOSIS — G6289 Other specified polyneuropathies: Secondary | ICD-10-CM

## 2019-05-31 DIAGNOSIS — R200 Anesthesia of skin: Secondary | ICD-10-CM

## 2019-05-31 DIAGNOSIS — E1142 Type 2 diabetes mellitus with diabetic polyneuropathy: Secondary | ICD-10-CM

## 2019-06-01 ENCOUNTER — Ambulatory Visit: Payer: Medicare Other

## 2019-06-02 ENCOUNTER — Telehealth: Payer: Self-pay

## 2019-06-02 NOTE — Telephone Encounter (Signed)
LVM w COVID screen, front door and back lab info 12.10.2020 TLJ 

## 2019-06-06 ENCOUNTER — Other Ambulatory Visit: Payer: Self-pay

## 2019-06-06 ENCOUNTER — Other Ambulatory Visit: Payer: Medicare Other

## 2019-06-07 ENCOUNTER — Other Ambulatory Visit: Payer: Self-pay | Admitting: Family Medicine

## 2019-06-08 ENCOUNTER — Encounter: Payer: Medicare Other | Admitting: Family Medicine

## 2019-06-08 NOTE — Telephone Encounter (Signed)
Pt being seen today in office with Debbie.  Will hold to ensure no changes in medications.

## 2019-06-13 ENCOUNTER — Other Ambulatory Visit: Payer: Self-pay | Admitting: Family Medicine

## 2019-08-12 ENCOUNTER — Telehealth: Payer: Self-pay

## 2019-08-12 NOTE — Telephone Encounter (Signed)
LVM w COVID screen, front door and back lab info 2.19.2021 TLJ 

## 2019-08-15 ENCOUNTER — Telehealth: Payer: Self-pay

## 2019-08-15 ENCOUNTER — Ambulatory Visit: Payer: Medicare Other

## 2019-08-15 ENCOUNTER — Other Ambulatory Visit: Payer: Self-pay

## 2019-08-15 NOTE — Telephone Encounter (Signed)
Noted  

## 2019-08-15 NOTE — Telephone Encounter (Signed)
Called patient a total of 3 times trying to complete Medicare Wellness visit. Patient never answered phone call. Left message on voicemail notifying patient today's appointment will be cancelled and may be completed at her upcoming physical appointment with provider.

## 2019-08-16 ENCOUNTER — Other Ambulatory Visit: Payer: Medicare Other

## 2019-08-17 ENCOUNTER — Other Ambulatory Visit: Payer: Self-pay | Admitting: Family Medicine

## 2019-08-18 NOTE — Telephone Encounter (Signed)
Pt has an appt with Debbie on 08/19/19  Please advise, thanks.   

## 2019-08-18 NOTE — Telephone Encounter (Signed)
Best number [phone]  Pt called due to transportation she wanted to know if she could do a virtual appointment  for med refill tomorrow 2/26  and r/s her cpx.  Per ashytn pt needed to have labs that has not been done

## 2019-08-18 NOTE — Telephone Encounter (Signed)
Can you find out what meds she needs refilled? Virtual visit preferred over no visit, but she is due CPE and labs.

## 2019-08-19 ENCOUNTER — Telehealth (INDEPENDENT_AMBULATORY_CARE_PROVIDER_SITE_OTHER): Payer: Medicare Other | Admitting: Family Medicine

## 2019-08-19 ENCOUNTER — Encounter: Payer: Self-pay | Admitting: Family Medicine

## 2019-08-19 VITALS — BP 120/74 | Wt 133.0 lb

## 2019-08-19 DIAGNOSIS — F419 Anxiety disorder, unspecified: Secondary | ICD-10-CM | POA: Diagnosis not present

## 2019-08-19 DIAGNOSIS — E1142 Type 2 diabetes mellitus with diabetic polyneuropathy: Secondary | ICD-10-CM

## 2019-08-19 DIAGNOSIS — E079 Disorder of thyroid, unspecified: Secondary | ICD-10-CM | POA: Diagnosis not present

## 2019-08-19 DIAGNOSIS — M25552 Pain in left hip: Secondary | ICD-10-CM | POA: Diagnosis not present

## 2019-08-19 MED ORDER — SERTRALINE HCL 50 MG PO TABS: 50.0000 mg | ORAL_TABLET | Freq: Every day | ORAL | 3 refills | Status: DC

## 2019-08-19 MED ORDER — HYDROXYZINE HCL 25 MG PO TABS: 12.5000 mg | ORAL_TABLET | Freq: Three times a day (TID) | ORAL | 1 refills | Status: DC | PRN

## 2019-08-19 NOTE — Progress Notes (Signed)
Virtual Visit via Video Note  I connected with Taylor Benitez on 08/19/19 at 10:30 AM EST by a video enabled telemedicine application and verified that I am speaking with the correct person using two identifiers.  Location: Patient: In her home Provider: [facility] Persons participating in virtual visit: Patient and provider   I discussed the limitations of evaluation and management by telemedicine and the availability of in person appointments. The patient expressed understanding and agreed to proceed.  History of Present Illness: This is a 59 year old female who presents today for virtual visit for follow-up of medical conditions.  COVID-19 infection-she had several months ago but denies any lingering symptoms.  Neuropathy-it has been "not too bad," lately.  She has been working very hard to make healthy food choices.  Diabetes mellitus-checking blood sugars at home running 1 30-2 15.  She has been off of her Metformin and glipizide due to side effects.  Her last hemoglobin A1c 03/28/2019 was 6.8.  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  She requests referral to orthopedic surgery.  Increase depression and anxiety-she is living at home with her mother and this is caused her increased stressors.  She has had increased anxiety with feeling hot and feels that this is worse due to being cooped up.  She has not been on any recent medication for anxiety or depression.   Observations/Objective: Patient is alert and answers questions appropriately.  Visible skin is unremarkable.  Respirations are even and unlabored, she is normally conversive without audible wheeze or shortness of breath.  Her mood and affect are appropriate. BP 120/74 Comment: per patient  Wt 133 lb (60.3 kg) Comment: per patient  BMI 24.93 kg/m  Wt Readings from Last 3 Encounters:  08/19/19 133 lb (60.3 kg)  03/28/19 131 lb 6.4 oz (59.6 kg)  02/16/16  154 lb (69.9 kg)    Assessment and Plan: 1. Type 2 diabetes mellitus with diabetic polyneuropathy, without long-term current use of insulin (HCC) -She agrees to make an appointment for labs next week.  We will see how her blood sugar is running off of her medication.  Encouraged her to continue making healthy food choices and checking her blood sugar regularly. - Hemoglobin A1c; Future  2. Left hip pain - Ambulatory referral to Orthopedic Surgery  3. Anxiety -Discussed starting her on low-dose antidepressant and as needed hydroxyzine for her depression and anxiety -Follow-up in 10 to 12 weeks, sooner if worsening symptoms or intolerance to medication - sertraline (ZOLOFT) 50 MG tablet; Take 1 tablet (50 mg total) by mouth daily.  Dispense: 30 tablet; Refill: 3 - hydrOXYzine (ATARAX/VISTARIL) 25 MG tablet; Take 0.5-1 tablets (12.5-25 mg total) by mouth every 8 (eight) hours as needed for anxiety.  Dispense: 30 tablet; Refill: 1  4. Thyroid disease - TSH; Future   Olean Ree, FNP-BC  [facility], [facility]  08/21/2019 1:57 PM   Follow Up Instructions:    I discussed the assessment and treatment plan with the patient. The patient was provided an opportunity to ask questions and all were answered. The patient agreed with the plan and demonstrated an understanding of the instructions.   The patient was advised to call back or seek an in-person evaluation if the symptoms worsen or if the condition fails to improve as anticipated.   Emi Belfast, FNP

## 2019-08-19 NOTE — Telephone Encounter (Signed)
Amitriptyline [facility]ot[facility]roxine [facility]  Changed appointment to [employer]

## 2019-08-19 NOTE — Telephone Encounter (Signed)
LEFT MESSAGE ASKING PT TO CALL OFFICE

## 2019-08-19 NOTE — Telephone Encounter (Signed)
Prescriptions sent in yesterday

## 2019-08-24 ENCOUNTER — Telehealth: Payer: Self-pay | Admitting: Family Medicine

## 2019-08-24 NOTE — Telephone Encounter (Signed)
I received a request for ankle foot orthotics. I have never treated her for the conditions outlined on the request. Is she currently wearing braces? Do they need to be replaced? The form is in my box.

## 2019-08-26 NOTE — Telephone Encounter (Signed)
Left detailed message to call back on verified voicemail.  Need to know if she is wearing braces - ankle/foot or wrist.  Scam with insurance??  Form is on my desk

## 2019-09-05 NOTE — Telephone Encounter (Signed)
Message left for patient to return my call.  

## 2019-09-07 NOTE — Telephone Encounter (Signed)
ATC patient again - LM on cell number again Unable to reach anyone on home number - voicemail did not match the patient so no message was left.   Sent another [employer].  Message will be closed at this time as there have been several attempts to the reach the patient and voicemails left that the patient has not returned.

## 2019-09-07 NOTE — Telephone Encounter (Signed)
Will hold in my box to follow up on the mychart message.

## 2019-09-08 NOTE — Telephone Encounter (Signed)
Attempted to reach patient again.  Mychart message not read.   Letter mailed to contact our office and to verify phone numbers we have on file are correct.   Will close this encounter as I have made several attempts to reach the patient.

## 2019-10-11 ENCOUNTER — Other Ambulatory Visit: Payer: Self-pay | Admitting: Family Medicine

## 2019-10-12 NOTE — Telephone Encounter (Signed)
Rx was last refilled on 08/18/19 for #60 with 1 refill. Patient was last seen by video visit on 08/19/19 and has no upcoming appts.  Eunice Blase, is this ok to refill?

## 2019-10-17 ENCOUNTER — Telehealth: Payer: Self-pay | Admitting: Family Medicine

## 2019-10-17 ENCOUNTER — Other Ambulatory Visit: Payer: Self-pay | Admitting: Family Medicine

## 2019-10-17 NOTE — Telephone Encounter (Signed)
Please call patient and tell her that she needs an appointment for evaluation for surgical clearance. Please schedule her for a 30 minute appointment as soon as possible. Form in my office.

## 2019-10-17 NOTE — Telephone Encounter (Signed)
I left message for patient to return phone call.   

## 2019-10-17 NOTE — Telephone Encounter (Signed)
Patient has been scheduled for surgical clearance.

## 2019-10-21 LAB — COMPREHENSIVE METABOLIC PANEL
Albumin: 4.3 (ref 3.5–5.0)
Calcium: 9.6 (ref 8.7–10.7)
GFR calc Af Amer: 89
GFR calc non Af Amer: 73
Globulin: 3

## 2019-10-21 LAB — CBC AND DIFFERENTIAL
HCT: 40 (ref 36–46)
Hemoglobin: 12.4 (ref 12.0–16.0)
Platelets: 201 (ref 150–399)
WBC: 6.8

## 2019-10-21 LAB — HEPATIC FUNCTION PANEL
ALT: 15 (ref 7–35)
AST: 18 (ref 13–35)
Alkaline Phosphatase: 96 (ref 25–125)

## 2019-10-21 LAB — BASIC METABOLIC PANEL
BUN: 21 (ref 4–21)
CO2: 23 — AB (ref 13–22)
Chloride: 102 (ref 99–108)
Creatinine: 0.8 (ref 0.5–1.1)
Glucose: 154
Potassium: 4.5 (ref 3.4–5.3)
Sodium: 136 — AB (ref 137–147)

## 2019-10-21 LAB — NOVEL CORONAVIRUS, NAA: SARS-CoV-2, NAA: REACTIVE

## 2019-10-21 LAB — VITAMIN D 25 HYDROXY (VIT D DEFICIENCY, FRACTURES): Vit D, 25-Hydroxy: 18.94

## 2019-10-21 LAB — CBC: RBC: 4.64 (ref 3.87–5.11)

## 2019-10-24 NOTE — Progress Notes (Signed)
PCP - Emi Belfast, FNP Cardiologist -   Chest x-ray -  EKG -  Stress Test -  ECHO -  Cardiac Cath -   Sleep Study -  CPAP -   Fasting Blood Sugar -  Checks Blood Sugar _____ times a day  Blood Thinner Instructions: Aspirin Instructions: Last Dose:  Anesthesia review:   Patient denies shortness of breath, fever, cough and chest pain at PAT appointment   Patient verbalized understanding of instructions that were given to them at the PAT appointment. Patient was also instructed that they will need to review over the PAT instructions again at home before surgery.

## 2019-10-24 NOTE — Patient Instructions (Addendum)
DUE TO COVID-19 ONLY ONE VISITOR IS ALLOWED TO COME WITH YOU AND STAY IN THE WAITING ROOM ONLY DURING PRE OP AND PROCEDURE DAY OF SURGERY. THE 1 VISITOR MAY VISIT WITH YOU AFTER SURGERY IN YOUR PRIVATE ROOM DURING VISITING HOURS ONLY!  YOU NEED TO HAVE A COVID 19 TEST ON 10-29-19 @ 12:25 PM, THIS TEST MUST BE DONE BEFORE SURGERY, COME  [place], [address] , [address].  ([place]) ONCE YOUR COVID TEST IS COMPLETED, PLEASE BEGIN THE QUARANTINE INSTRUCTIONS AS OUTLINED IN YOUR HANDOUT.                Taylor Benitez  10/24/2019   Your procedure is scheduled on: 11-02-19    Report to [facility] Main  Entrance    Report to Admitting at 10:45 AM     Call this number if you have problems the morning of surgery [phone]    Remember: AFTER MIDNIGHT THE NIGHT PRIOR TO SURGERY. NOTHING BY MOUTH EXCEPT CLEAR LIQUIDS UNTIL 10:15 AM . PLEASE FINISH G2 DRINK PER SURGEON ORDER  WHICH NEEDS TO BE COMPLETED AT 10:15 AM.   CLEAR LIQUID DIET   Foods Allowed                                                                     Foods Excluded  Coffee and tea, regular and decaf                             liquids that you cannot  Plain Jell-O any favor except red or purple                                           see through such as: Fruit ices (not with fruit pulp)                                     milk, soups, orange juice  Iced Popsicles                                    All solid food Carbonated beverages, regular and diet                                    Cranberry, grape and apple juices Sports drinks like Gatorade Lightly seasoned clear broth or consume(fat free) Sugar, honey syrup   _____________________________________________________________________     Take these medicines the morning of surgery with A SIP OF WATER: Hydrozyzine (Vistaril), Levothyroxine (Synthriod), and Oxycodone HCL, as needed.   BRUSH YOUR TEETH MORNING OF SURGERY AND RINSE YOUR  MOUTH OUT, NO CHEWING GUM CANDY OR MINTS.  DO NOT TAKE ANY DIABETIC MEDICATIONS DAY OF YOUR SURGERY  You may not have any metal on your body including hair pins and              piercings    Do not wear jewelry, makeup, lotions, powders or perfumes, deodorant              Do not wear nail polish on your fingernails.  Do not shave  48 hours prior to surgery.                 Do not bring valuables to the hospital. [facility] IS NOT             RESPONSIBLE   FOR VALUABLES.  Contacts, dentures or bridgework may not be worn into surgery.  You may bring a small overnight bag     Special Instructions: N/A              Please read over the following fact sheets you were given: _____________________________________________________________________  How to Manage Your Diabetes Before and After Surgery  Why is it important to control my blood sugar before and after surgery? . Improving blood sugar levels before and after surgery helps healing and can limit problems. . A way of improving blood sugar control is eating a healthy diet by: o  Eating less sugar and carbohydrates o  Increasing activity/exercise o  Talking with your doctor about reaching your blood sugar goals . High blood sugars (greater than 180 mg/dL) can raise your risk of infections and slow your recovery, so you will need to focus on controlling your diabetes during the weeks before surgery. . Make sure that the doctor who takes care of your diabetes knows about your planned surgery including the date and location.  How do I manage my blood sugar before surgery? . Check your blood sugar at least 4 times a day, starting 2 days before surgery, to make sure that the level is not too high or low. o Check your blood sugar the morning of your surgery when you wake up and every 2 hours until you get to the Short Stay unit. . If your blood sugar is less than 70 mg/dL, you will need to treat for low blood  sugar: o Do not take insulin. o Treat a low blood sugar (less than 70 mg/dL) with  cup of clear juice (cranberry or apple), 4 glucose tablets, OR glucose gel. o Recheck blood sugar in 15 minutes after treatment (to make sure it is greater than 70 mg/dL). If your blood sugar is not greater than 70 mg/dL on recheck, call [phone] for further instructions. . Report your blood sugar to the short stay nurse when you get to Short Stay.  . If you are admitted to the hospital after surgery: o Your blood sugar will be checked by the staff and you will probably be given insulin after surgery (instead of oral diabetes medicines) to make sure you have good blood sugar levels. o The goal for blood sugar control after surgery is 80-180 mg/dL.   WHAT DO I DO ABOUT MY DIABETES MEDICATION?  Marland Kitchen Do not take oral diabetes medicines (pills) the morning of surgery.  . THE DAY BEFORE SURGERY, take only your  Morning/lunch dose of Glipizide        T    Reviewed and Endorsed by [facility] Patient Education Committee, August 2015           [facility] - Preparing for Surgery Before surgery, you can play an important role.  Because skin is not sterile, your skin needs to be as free of germs as possible.  You can reduce the number of germs on your skin by washing with CHG (chlorahexidine gluconate) soap before surgery.  CHG is an antiseptic cleaner which kills germs and bonds with the skin to continue killing germs even after washing. Please DO NOT use if you have an allergy to CHG or antibacterial soaps.  If your skin becomes reddened/irritated stop using the CHG and inform your nurse when you arrive at Short Stay. Do not shave (including legs and underarms) for at least 48 hours prior to the first CHG shower.  You may shave your face/neck. Please follow these instructions carefully:  1.  Shower with CHG Soap the night before surgery and the  morning of Surgery.  2.  If you choose to wash your hair, wash your  hair first as usual with your  normal  shampoo.  3.  After you shampoo, rinse your hair and body thoroughly to remove the  shampoo.                           4.  Use CHG as you would any other liquid soap.  You can apply chg directly  to the skin and wash                       Gently with a scrungie or clean washcloth.  5.  Apply the CHG Soap to your body ONLY FROM THE NECK DOWN.   Do not use on face/ open                           Wound or open sores. Avoid contact with eyes, ears mouth and genitals (private parts).                       Wash face,  Genitals (private parts) with your normal soap.             6.  Wash thoroughly, paying special attention to the area where your surgery  will be performed.  7.  Thoroughly rinse your body with warm water from the neck down.  8.  DO NOT shower/wash with your normal soap after using and rinsing off  the CHG Soap.                9.  Pat yourself dry with a clean towel.            10.  Wear clean pajamas.            11.  Place clean sheets on your bed the night of your first shower and do not  sleep with pets. Day of Surgery : Do not apply any lotions/deodorants the morning of surgery.  Please wear clean clothes to the hospital/surgery center.  FAILURE TO FOLLOW THESE INSTRUCTIONS MAY RESULT IN THE CANCELLATION OF YOUR SURGERY PATIENT SIGNATURE_________________________________  NURSE SIGNATURE__________________________________  ________________________________________________________________________   Taylor Benitez  An incentive spirometer is a tool that can help keep your lungs clear and active. This tool measures how well you are filling your lungs with each breath. Taking long deep breaths may help reverse or decrease the chance of developing breathing (pulmonary) problems (especially infection) following:  A long period of time when you are unable to move or be active. BEFORE THE PROCEDURE   If  the spirometer includes an indicator  to show your best effort, your nurse or respiratory therapist will set it to a desired goal.  If possible, sit up straight or lean slightly forward. Try not to slouch.  Hold the incentive spirometer in an upright position. INSTRUCTIONS FOR USE  1. Sit on the edge of your bed if possible, or sit up as far as you can in bed or on a chair. 2. Hold the incentive spirometer in an upright position. 3. Breathe out normally. 4. Place the mouthpiece in your mouth and seal your lips tightly around it. 5. Breathe in slowly and as deeply as possible, raising the piston or the ball toward the top of the column. 6. Hold your breath for 3-5 seconds or for as long as possible. Allow the piston or ball to fall to the bottom of the column. 7. Remove the mouthpiece from your mouth and breathe out normally. 8. Rest for a few seconds and repeat Steps 1 through 7 at least 10 times every 1-2 hours when you are awake. Take your time and take a few normal breaths between deep breaths. 9. The spirometer may include an indicator to show your best effort. Use the indicator as a goal to work toward during each repetition. 10. After each set of 10 deep breaths, practice coughing to be sure your lungs are clear. If you have an incision (the cut made at the time of surgery), support your incision when coughing by placing a pillow or rolled up towels firmly against it. Once you are able to get out of bed, walk around indoors and cough well. You may stop using the incentive spirometer when instructed by your caregiver.  RISKS AND COMPLICATIONS  Take your time so you do not get dizzy or light-headed.  If you are in pain, you may need to take or ask for pain medication before doing incentive spirometry. It is harder to take a deep breath if you are having pain. AFTER USE  Rest and breathe slowly and easily.  It can be helpful to keep track of a log of your progress. Your caregiver can provide you with a simple table to help  with this. If you are using the spirometer at home, follow these instructions: SEEK MEDICAL CARE IF:   You are having difficultly using the spirometer.  You have trouble using the spirometer as often as instructed.  Your pain medication is not giving enough relief while using the spirometer.  You develop fever of 100.5 F (38.1 C) or higher. SEEK IMMEDIATE MEDICAL CARE IF:   You cough up bloody sputum that had not been present before.  You develop fever of 102 F (38.9 C) or greater.  You develop worsening pain at or near the incision site. MAKE SURE YOU:   Understand these instructions.  Will watch your condition.  Will get help right away if you are not doing well or get worse. Document Released: 10/20/2006 Document Revised: 09/01/2011 Document Reviewed: 12/21/2006 Exit[employer] Patient Information 2014 ExitCare, [place].   ________________________________________________________________________  WHAT IS A BLOOD TRANSFUSION? Blood Transfusion Information  A transfusion is the replacement of blood or some of its parts. Blood is made up of multiple cells which provide different functions.  Red blood cells carry oxygen and are used for blood loss replacement.  White blood cells fight against infection.  Platelets control bleeding.  Plasma helps clot blood.  Other blood products are available for specialized needs, such as hemophilia or other clotting disorders.  BEFORE THE TRANSFUSION  Who gives blood for transfusions?   Healthy volunteers who are fully evaluated to make sure their blood is safe. This is blood bank blood. Transfusion therapy is the safest it has ever been in the practice of medicine. Before blood is taken from a donor, a complete history is taken to make sure that person has no history of diseases nor engages in risky social behavior (examples are intravenous drug use or sexual activity with multiple partners). The donor's travel history is screened to  minimize risk of transmitting infections, such as malaria. The donated blood is tested for signs of infectious diseases, such as HIV and hepatitis. The blood is then tested to be sure it is compatible with you in order to minimize the chance of a transfusion reaction. If you or a relative donates blood, this is often done in anticipation of surgery and is not appropriate for emergency situations. It takes many days to process the donated blood. RISKS AND COMPLICATIONS Although transfusion therapy is very safe and saves many lives, the main dangers of transfusion include:   Getting an infectious disease.  Developing a transfusion reaction. This is an allergic reaction to something in the blood you were given. Every precaution is taken to prevent this. The decision to have a blood transfusion has been considered carefully by your caregiver before blood is given. Blood is not given unless the benefits outweigh the risks. AFTER THE TRANSFUSION  Right after receiving a blood transfusion, you will usually feel much better and more energetic. This is especially true if your red blood cells have gotten low (anemic). The transfusion raises the level of the red blood cells which carry oxygen, and this usually causes an energy increase.  The nurse administering the transfusion will monitor you carefully for complications. HOME CARE INSTRUCTIONS  No special instructions are needed after a transfusion. You may find your energy is better. Speak with your caregiver about any limitations on activity for underlying diseases you may have. SEEK MEDICAL CARE IF:   Your condition is not improving after your transfusion.  You develop redness or irritation at the intravenous (IV) site. SEEK IMMEDIATE MEDICAL CARE IF:  Any of the following symptoms occur over the next 12 hours:  Shaking chills.  You have a temperature by mouth above 102 F (38.9 C), not controlled by medicine.  Chest, back, or muscle  pain.  People around you feel you are not acting correctly or are confused.  Shortness of breath or difficulty breathing.  Dizziness and fainting.  You get a rash or develop hives.  You have a decrease in urine output.  Your urine turns a dark color or changes to pink, red, or brown. Any of the following symptoms occur over the next 10 days:  You have a temperature by mouth above 102 F (38.9 C), not controlled by medicine.  Shortness of breath.  Weakness after normal activity.  The white part of the eye turns yellow (jaundice).  You have a decrease in the amount of urine or are urinating less often.  Your urine turns a dark color or changes to pink, red, or brown. Document Released: 06/06/2000 Document Revised: 09/01/2011 Document Reviewed: 01/24/2008 [facility] Patient Information 2014 [place], [place].  _______________________________________________________________________

## 2019-10-25 ENCOUNTER — Encounter (HOSPITAL_COMMUNITY): Payer: Self-pay

## 2019-10-25 ENCOUNTER — Other Ambulatory Visit: Payer: Self-pay

## 2019-10-25 ENCOUNTER — Encounter (HOSPITAL_COMMUNITY)
Admission: RE | Admit: 2019-10-25 | Discharge: 2019-10-25 | Disposition: A | Discharge status: Home / Self Care | Payer: Medicare Other | Source: Ambulatory Visit | Attending: Orthopedic Surgery | Admitting: Orthopedic Surgery

## 2019-10-25 DIAGNOSIS — E1142 Type 2 diabetes mellitus with diabetic polyneuropathy: Secondary | ICD-10-CM | POA: Insufficient documentation

## 2019-10-25 DIAGNOSIS — M25552 Pain in left hip: Secondary | ICD-10-CM | POA: Insufficient documentation

## 2019-10-25 DIAGNOSIS — Z01818 Encounter for other preprocedural examination: Secondary | ICD-10-CM | POA: Insufficient documentation

## 2019-10-26 ENCOUNTER — Encounter: Payer: Self-pay | Admitting: Family Medicine

## 2019-10-26 ENCOUNTER — Ambulatory Visit (INDEPENDENT_AMBULATORY_CARE_PROVIDER_SITE_OTHER): Payer: Medicare Other | Admitting: Family Medicine

## 2019-10-26 ENCOUNTER — Encounter (HOSPITAL_COMMUNITY)
Admission: RE | Admit: 2019-10-26 | Discharge: 2019-10-26 | Disposition: A | Discharge status: Home / Self Care | Payer: Medicare Other | Source: Ambulatory Visit | Attending: Orthopedic Surgery | Admitting: Orthopedic Surgery

## 2019-10-26 VITALS — BP 124/78 | HR 100 | Temp 97.6°F | Wt 146.0 lb

## 2019-10-26 DIAGNOSIS — Z01818 Encounter for other preprocedural examination: Secondary | ICD-10-CM | POA: Diagnosis not present

## 2019-10-26 DIAGNOSIS — M25552 Pain in left hip: Secondary | ICD-10-CM | POA: Diagnosis not present

## 2019-10-26 DIAGNOSIS — E1142 Type 2 diabetes mellitus with diabetic polyneuropathy: Secondary | ICD-10-CM

## 2019-10-26 LAB — CBC
HCT: 34.4 % — ABNORMAL LOW (ref 36.0–46.0)
Hemoglobin: 10.7 g/dL — ABNORMAL LOW (ref 12.0–15.0)
MCH: 27.4 pg (ref 26.0–34.0)
MCHC: 31.1 g/dL (ref 30.0–36.0)
MCV: 88 fL (ref 80.0–100.0)
Platelets: 160 10*3/uL (ref 150–400)
RBC: 3.91 MIL/uL (ref 3.87–5.11)
RDW: 15.9 % — ABNORMAL HIGH (ref 11.5–15.5)
WBC: 5.4 10*3/uL (ref 4.0–10.5)
nRBC: 0 % (ref 0.0–0.2)

## 2019-10-26 LAB — HEMOGLOBIN A1C
Hgb A1c MFr Bld: 7.6 % — ABNORMAL HIGH (ref 4.8–5.6)
Mean Plasma Glucose: 171.42 mg/dL

## 2019-10-26 LAB — COMPREHENSIVE METABOLIC PANEL
ALT: 35 U/L (ref 0–44)
AST: 44 U/L — ABNORMAL HIGH (ref 15–41)
Albumin: 4.1 g/dL (ref 3.5–5.0)
Alkaline Phosphatase: 87 U/L (ref 38–126)
Anion gap: 10 (ref 5–15)
BUN: 15 mg/dL (ref 6–20)
CO2: 24 mmol/L (ref 22–32)
Calcium: 9 mg/dL (ref 8.9–10.3)
Chloride: 106 mmol/L (ref 98–111)
Creatinine, Ser: 0.8 mg/dL (ref 0.44–1.00)
GFR calc Af Amer: >60 mL/min (ref >60)
GFR calc non Af Amer: >60 mL/min (ref >60)
Glucose, Bld: 218 mg/dL — ABNORMAL HIGH (ref 70–99)
Potassium: 3.9 mmol/L (ref 3.5–5.1)
Sodium: 140 mmol/L (ref 135–145)
Total Bilirubin: 0.3 mg/dL (ref 0.3–1.2)
Total Protein: 6.9 g/dL (ref 6.5–8.1)

## 2019-10-26 LAB — APTT: aPTT: 30 seconds (ref 24–36)

## 2019-10-26 LAB — ABO/RH: ABO/RH(D): A POS

## 2019-10-26 LAB — SURGICAL PCR SCREEN
MRSA, PCR: NEGATIVE
Staphylococcus aureus: POSITIVE — AB

## 2019-10-26 LAB — PROTIME-INR
INR: 1.1 (ref 0.8–1.2)
Prothrombin Time: 13.5 seconds (ref 11.4–15.2)

## 2019-10-26 NOTE — Progress Notes (Signed)
   Subjective:    Patient ID: Taylor Benitez, female    DOB: July 11, 1960, 59 y.o.   MRN: [id number]  HPI This is a 59 yo female who presents today for preoperative clearance. She is scheduled for labs and EKG at [facility] preop following this appointment.  She has been doing well. Last seen by me for virtual visit 2/21, did not follow through with labs.  She is scheduled for left total hip arthroplasty 11/02/19 by Dr. Despina Hick. She has bilateral hip pain and some shoulder pain. Has otherwise been doing ok.  DM type 2- overdue hgba1c, last was 03/28/19 at last in person visit- was 6.8. She reports that she had stopped taking metformin and glipizide due to gastrointestinal affects. She rarely checks blood sugars at home. Never smoker.   Review of Systems Denies chest pain, palpitations, SOB, DOE, has occasional leg swelling, relieved with elevation.     Objective:   Physical Exam Vitals reviewed.  Constitutional:      General: She is not in acute distress.    Appearance: Normal appearance. She is normal weight. She is not ill-appearing or toxic-appearing.  HENT:     Head: Normocephalic and atraumatic.  Cardiovascular:     Rate and Rhythm: Normal rate and regular rhythm.     Heart sounds: Normal heart sounds.  Pulmonary:     Effort: Pulmonary effort is normal.     Breath sounds: Normal breath sounds.  Neurological:     Mental Status: She is alert.       BP 124/78   Pulse 100   Temp 97.6 F (36.4 C) (Temporal)   Wt 146 lb (66.2 kg)   SpO2 98%   BMI 27.59 kg/m  Wt Readings from Last 3 Encounters:  10/26/19 146 lb (66.2 kg)  10/25/19 140 lb (63.5 kg)  08/19/19 133 lb (60.3 kg)       Assessment & Plan:  1. Pre-op examination - patient to have labs/ ekg later today, I called [facility] preop to verify labs. Will complete preoperative risk evaluation once labs returned  2. Type 2 diabetes mellitus with diabetic polyneuropathy, without long-term current use of insulin (HCC) - she is  having hgba1c drawn today, discussed next steps if elevated and medication needed. She stopped her oral meds, thought they were causing gi symptoms. She continued to have symptoms off metformin and is agreeable to restarting if needed.   3. Left hip pain - scheduled for total hip arthroplasty next week  - follow up in 4 months, sooner if needed  This visit occurred during the SARS-CoV-2 public health emergency.  Safety protocols were in place, including screening questions prior to the visit, additional usage of staff PPE, and extensive cleaning of exam room while observing appropriate contact time as indicated for disinfecting solutions.      Olean Ree, FNP-BC  [facility], [facility]  10/26/2019 12:10 PM

## 2019-10-26 NOTE — Progress Notes (Signed)
PCR results routed to Dr. Aluisio's office for review 

## 2019-10-26 NOTE — Patient Instructions (Signed)
Follow up in 4 months  Good luck with your surgery!!

## 2019-10-28 ENCOUNTER — Encounter: Payer: Self-pay | Admitting: Family Medicine

## 2019-10-28 ENCOUNTER — Telehealth: Payer: Self-pay | Admitting: Family Medicine

## 2019-10-28 ENCOUNTER — Telehealth: Payer: Self-pay

## 2019-10-28 NOTE — Telephone Encounter (Signed)
Pt left v/m that she had labs done on 10/26/19 and pt saw note that she had a UTI. Pt is getting ready to have a surgery and wants to know if needs med or what to do. I looked under lab tab and I did not see U/A or C&S done.Please advise. I was unable to get pt for more info. FYI to Ashtyn CMA in box and Harlin Heys FNP.

## 2019-10-28 NOTE — Telephone Encounter (Signed)
Called patient to inform her of message from Debbi, no answer LMTCB

## 2019-10-28 NOTE — Telephone Encounter (Signed)
I do not see urine but do see nasal swab that was positive. I will send patient a mychart message to see if I can get clarification.

## 2019-10-28 NOTE — Telephone Encounter (Signed)
Please call patient and let her know that I spoke to Dr. Tana Felts office regarding her labs. Blood sugar a little high and I recommend she avoid simple carbs and sugars prior to her surgery and after. She will need follow up in 3-4 months and if not improved, will need to restart medication. Dr. Tana Felts office said they started her on iron tablets. At her follow up, will recheck and refer her for colonoscopy.

## 2019-10-29 ENCOUNTER — Other Ambulatory Visit (HOSPITAL_COMMUNITY)
Admission: RE | Admit: 2019-10-29 | Discharge: 2019-10-29 | Disposition: A | Discharge status: Home / Self Care | Payer: Medicare Other | Source: Ambulatory Visit | Attending: Orthopedic Surgery | Admitting: Orthopedic Surgery

## 2019-10-29 DIAGNOSIS — Z01812 Encounter for preprocedural laboratory examination: Secondary | ICD-10-CM | POA: Insufficient documentation

## 2019-10-29 DIAGNOSIS — Z20822 Contact with and (suspected) exposure to covid-19: Secondary | ICD-10-CM | POA: Insufficient documentation

## 2019-10-29 LAB — SARS CORONAVIRUS 2 (TAT 6-24 HRS): SARS Coronavirus 2: NEGATIVE

## 2019-11-01 NOTE — Progress Notes (Signed)
Voice message left in regards to change in surgery time at 8:30 AM, and no return call. 2nd call attempted at 3:53 PM, and went to voice message.

## 2019-11-01 NOTE — H&P (Signed)
TOTAL HIP ADMISSION H&P  Patient is admitted for left total hip arthroplasty.  Subjective:  Chief Complaint: Left hip pain  HPI: Taylor Benitez, 59 y.o. female, has a history of pain and functional disability in the left hip due to arthritis and patient has failed non-surgical conservative treatments for greater than 12 weeks to include NSAID's and/or analgesics, use of assistive devices and activity modification. Onset of symptoms was gradual, starting several years ago with gradually worsening course since that time. The patient noted no past surgery on the left hip. Patient currently rates pain in the left hip at 10 out of 10 with activity. Patient has worsening of pain with activity and weight bearing, pain that interfers with activities of daily living and pain with passive range of motion. 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 She has got some erosion of the superior femoral head and slight erosion superior acetabulum by imaging studies. This condition presents safety issues increasing the risk of falls. There is no current active infection.  Patient Active Problem List   Diagnosis Date Noted  . Other cirrhosis of liver (HCC) 11/17/2016  . Small bowel obstruction (HCC) 11/14/2016  . Chronic narcotic use 01/09/2016  . Narcotic overdose (HCC)   . Toxic encephalopathy 12/09/2015  . Overdose 12/09/2015  . Thyroid disease 12/09/2015  . Diabetes (HCC) 12/09/2015  . Arthritis 12/09/2015  . Essential hypertension 01/09/2015  . Chronic pain 04/19/2014  . Insomnia 03/21/2014  . Anxiety attack 11/29/2013  . Seronegative rheumatoid arthritis (HCC) 11/16/2013  . Peripheral neuropathy 11/16/2013  . Osteoarthritis of left knee 11/16/2013  . NONSPECIFIC ABN FINDING RAD & OTH EXAM GI TRACT 01/30/2009  . WHEEZING 06/30/2007    Past Medical History:  Diagnosis Date  . Arthritis    rhumatoid and osteo  . Cirrhosis of liver (HCC)   . Diabetes  (HCC)   . Hypertension   . Small bowel obstruction (HCC)   . Thyroid disease     Past Surgical History:  Procedure Laterality Date  . ABDOMINAL HYSTERECTOMY    . BREAST REDUCTION SURGERY Bilateral    1989  . COLON SURGERY     colon abcess, partial colectomy and colostomy with reversal  . COLOSTOMY  2006  . JOINT REPLACEMENT     Total knee replacement  . KNEE SURGERY Bilateral    total knee replacement  . SMALL INTESTINE SURGERY     bowel obstruction x 2  . TONSILLECTOMY    . TOTAL HIP ARTHROPLASTY Right 2019    Prior to Admission medications   Medication Sig Start Date End Date Taking? Authorizing Provider  albuterol (VENTOLIN HFA) 108 (90 Base) MCG/ACT inhaler Inhale 1-2 puffs into the lungs every 6 (six) hours as needed for wheezing or shortness of breath.  02/13/16  Yes [provider]  amitriptyline (ELAVIL) 100 MG tablet TAKE 2 TABLETS (200 MG TOTAL) BY MOUTH EVERY EVENING. Patient taking differently: Take 200 mg by mouth at bedtime.  10/12/19 03/01/23 Yes Emi Belfast, FNP  Baclofen 5 MG TABS Take 5 mg by mouth 2 (two) times daily as needed for spasms. 09/15/19  Yes [provider]  celecoxib (CELEBREX) 100 MG capsule Take 100 mg by mouth daily at 12 noon.  03/30/19  Yes [provider]  hydrOXYzine (ATARAX/VISTARIL) 25 MG tablet Take 0.5-1 tablets (12.5-25 mg total) by mouth every 8 (eight) hours as needed for anxiety. 08/19/19  Yes Emi Belfast, FNP  ibuprofen (ADVIL,MOTRIN) 200 MG tablet Take 400-600 mg by mouth every 8 (eight) hours as needed for moderate pain.    Yes [provider]  levothyroxine (SYNTHROID) 150 MCG tablet TAKE 1 TABLET BY MOUTH EVERY DAY Patient taking differently: Take 150 mcg by mouth daily before breakfast.  10/17/19  Yes [name], FNP  Oxycodone HCl 10 MG TABS Take 10 mg by mouth every 6 (six) hours as needed (hip pain.).   Yes [provider]  sertraline (ZOLOFT) 50 MG tablet Take 1  tablet (50 mg total) by mouth daily. 08/19/19   [name], FNP    Allergies  Allergen Reactions  . Penicillins Rash    Social History   Socioeconomic History  . Marital status: Widowed    Spouse name: Not on file  . Number of children: Not on file  . Years of education: Not on file  . Highest education level: Not on file  Occupational History  . Not on file  Tobacco Use  . Smoking status: Former Smoker    Types: E-cigarettes, Cigarettes    Quit date: 01/2019    Years since quitting: 0.7  . Smokeless tobacco: Never Used  Substance and Sexual Activity  . Alcohol use: No    Comment: Glass of Wine now and then 1 once 1-2 weekd  . Drug use: Never  . Sexual activity: Not Currently  Other Topics Concern  . Not on file  Social History Narrative  . Not on file   Social Determinants of Health   Financial Resource Strain:   . Difficulty of Paying Living Expenses:   Food Insecurity:   . Worried About Charity fundraiser in the Last Year:   . Arboriculturist in the Last Year:   Transportation Needs:   . Film/video editor (Medical):   Marland Kitchen Lack of Transportation (Non-Medical):   Physical Activity:   . Days of Exercise per Week:   . Minutes of Exercise per Session:   Stress:   . Feeling of Stress :   Social Connections:   . Frequency of Communication with Friends and Family:   . Frequency of Social Gatherings with Friends and Family:   . Attends Religious Services:   . Active Member of Clubs or Organizations:   . Attends Archivist Meetings:   Marland Kitchen Marital Status:   Intimate Partner Violence:   . Fear of Current or Ex-Partner:   . Emotionally Abused:   Marland Kitchen Physically Abused:   . Sexually Abused:       Tobacco Use: Medium Risk  . Smoking Tobacco Use: Former Smoker  . Smokeless Tobacco Use: Never Used   Social History   Substance and Sexual Activity  Alcohol Use No   Comment: Glass of Wine now and then 1 once 1-2 weekd    Family History    Problem Relation Age of Onset  . Arthritis Mother   . Depression Mother   . Diabetes Mother   . Hearing loss Mother   . Stroke Mother   . Cancer Father   . Diabetes Father   . Heart attack Father   . Heart disease Father   . Stroke Father   . Depression Sister   . Drug abuse Sister   . Depression Brother   . Arthritis Maternal Grandmother   . Cancer Maternal Grandfather   . COPD Maternal Grandfather   . Depression Maternal Grandfather   . Depression Paternal Grandmother   . Hyperlipidemia  Paternal Grandmother   . Heart disease Paternal Grandmother     Review of Systems  Constitutional: Negative for chills and fever.  HENT: Negative for congestion, sore throat and tinnitus.   Eyes: Negative for double vision, photophobia and pain.  Respiratory: Negative for cough, shortness of breath and wheezing.   Cardiovascular: Negative for chest pain, palpitations and orthopnea.  Gastrointestinal: Negative for heartburn, nausea and vomiting.  Genitourinary: Negative for dysuria, frequency and urgency.  Musculoskeletal: Positive for joint pain.  Neurological: Negative for dizziness, weakness and headaches.     Objective:  Physical Exam: Well nourished and well developed.  General: Alert and oriented x3, cooperative and pleasant, no acute distress.  Head: normocephalic, atraumatic, neck supple.  Eyes: EOMI.  Respiratory: breath sounds clear in all fields, no wheezing, rales, or rhonchi. Cardiovascular: Regular rate and rhythm, no murmurs, gallops or rubs.  Abdomen: non-tender to palpation and soft, normoactive bowel sounds. Musculoskeletal:  Left Hip Exam: The range of motion: Flexion to 90 degrees, Internal Rotation to 0 degrees, External Rotation to 0 degrees, and abduction to 5 to 10 degrees. There is no tenderness over the greater trochanteric bursa.   Calves soft and nontender. Motor function intact in LE. Strength 5/5 LE bilaterally. Neuro: Distal pulses 2+. Sensation to  light touch intact in LE.  Imaging Review Plain radiographs demonstrate severe degenerative joint disease of the left hip. The bone quality appears to be adequate for age and reported activity level.  Assessment/Plan:  End stage arthritis, left hip  The patient history, physical examination, clinical judgement of the provider and imaging studies are consistent with end stage degenerative joint disease of the left hip and total hip arthroplasty is deemed medically necessary. The treatment options including medical management, injection therapy, arthroscopy and arthroplasty were discussed at length. The risks and benefits of total hip arthroplasty were presented and reviewed. The risks due to aseptic loosening, infection, stiffness, dislocation/subluxation, thromboembolic complications and other imponderables were discussed. The patient acknowledged the explanation, agreed to proceed with the plan and consent was signed. Patient is being admitted for inpatient treatment for surgery, pain control, PT, OT, prophylactic antibiotics, VTE prophylaxis, progressive ambulation and ADLs and discharge planning.The patient is planning to be discharged home.  Anticipated LOS equal to or greater than 2 midnights due to - Age 57 and older with one or more of the following:  - Obesity  - Expected need for hospital services (PT, OT, Nursing) required for safe  discharge  - Anticipated need for postoperative skilled nursing care or inpatient rehab  - Active co-morbidities: Chronic pain requiring opiods OR   - Unanticipated findings during/Post Surgery: None  - Patient is a high risk of re-admission due to: None   Therapy Plans: HEP Disposition: Home with friend Planned DVT Prophylaxis: Aspirin 325 mg BID DME Needed: None PCP: Deboraha Sprang, FNP TXA: IV Allergies: PCN (rash) Anesthesia Concerns: None BMI: 25.3 Last HgbA1c: 7.6%  Other:  - Post-operative pain management will be complicated by chronic  opioid use.  - Currently takes 10 mg Oxycodone Q6.  - Patient was instructed on what medications to stop prior to surgery. - Follow-up visit in 2 weeks with Dr. Lequita Halt - Begin physical therapy following surgery - Pre-operative lab work as pre-surgical testing - Prescriptions will be provided in hospital at time of discharge  Arther Abbott, PA-C Orthopedic Surgery [facility] [place]

## 2019-11-01 NOTE — Telephone Encounter (Signed)
ATC, unable to leave VM d/t mailbox being full  Mychart message sent

## 2019-11-01 NOTE — Progress Notes (Signed)
Unable to reach pt per phone numbers provided; spoke with pts daughter listed under contacts Vincente Liberty) and given new arrival time and instructed to inform pt no food after midnight, clear liquids from midnight till 11:30am consuming entire presurgical drink given at PST appt at 11:30am then nothing by mouth. pts daughter stated she would text her mother the information.

## 2019-11-02 ENCOUNTER — Ambulatory Visit (HOSPITAL_COMMUNITY): Payer: Medicare Other

## 2019-11-02 ENCOUNTER — Inpatient Hospital Stay (HOSPITAL_COMMUNITY)
Admission: RE | Admit: 2019-11-02 | Discharge: 2019-11-04 | DRG: 470 | Disposition: A | Discharge status: Home / Self Care | Payer: Medicare Other | Source: Ambulatory Visit | Attending: Orthopedic Surgery | Admitting: Orthopedic Surgery

## 2019-11-02 ENCOUNTER — Other Ambulatory Visit: Payer: Self-pay

## 2019-11-02 ENCOUNTER — Observation Stay (HOSPITAL_COMMUNITY): Payer: Medicare Other

## 2019-11-02 ENCOUNTER — Encounter (HOSPITAL_COMMUNITY): Payer: Self-pay | Admitting: Orthopedic Surgery

## 2019-11-02 ENCOUNTER — Encounter (HOSPITAL_COMMUNITY)
Admission: RE | Disposition: A | Discharge status: Home / Self Care | Payer: Self-pay | Source: Ambulatory Visit | Attending: Orthopedic Surgery

## 2019-11-02 ENCOUNTER — Telehealth (HOSPITAL_COMMUNITY): Payer: Self-pay | Admitting: *Deleted

## 2019-11-02 ENCOUNTER — Ambulatory Visit (HOSPITAL_COMMUNITY): Payer: Medicare Other | Admitting: Certified Registered"

## 2019-11-02 ENCOUNTER — Ambulatory Visit (HOSPITAL_COMMUNITY): Payer: Medicare Other | Admitting: Physician Assistant

## 2019-11-02 DIAGNOSIS — Z8249 Family history of ischemic heart disease and other diseases of the circulatory system: Secondary | ICD-10-CM

## 2019-11-02 DIAGNOSIS — M1612 Unilateral primary osteoarthritis, left hip: Principal | ICD-10-CM | POA: Diagnosis present

## 2019-11-02 DIAGNOSIS — M0600 Rheumatoid arthritis without rheumatoid factor, unspecified site: Secondary | ICD-10-CM | POA: Diagnosis present

## 2019-11-02 DIAGNOSIS — Z83438 Family history of other disorder of lipoprotein metabolism and other lipidemia: Secondary | ICD-10-CM

## 2019-11-02 DIAGNOSIS — Z833 Family history of diabetes mellitus: Secondary | ICD-10-CM

## 2019-11-02 DIAGNOSIS — Z87891 Personal history of nicotine dependence: Secondary | ICD-10-CM

## 2019-11-02 DIAGNOSIS — F1120 Opioid dependence, uncomplicated: Secondary | ICD-10-CM | POA: Diagnosis present

## 2019-11-02 DIAGNOSIS — M169 Osteoarthritis of hip, unspecified: Secondary | ICD-10-CM | POA: Diagnosis present

## 2019-11-02 DIAGNOSIS — Z419 Encounter for procedure for purposes other than remedying health state, unspecified: Secondary | ICD-10-CM

## 2019-11-02 DIAGNOSIS — Z96653 Presence of artificial knee joint, bilateral: Secondary | ICD-10-CM | POA: Diagnosis present

## 2019-11-02 DIAGNOSIS — Z825 Family history of asthma and other chronic lower respiratory diseases: Secondary | ICD-10-CM

## 2019-11-02 DIAGNOSIS — E669 Obesity, unspecified: Secondary | ICD-10-CM | POA: Diagnosis present

## 2019-11-02 DIAGNOSIS — Z8261 Family history of arthritis: Secondary | ICD-10-CM

## 2019-11-02 DIAGNOSIS — Z6828 Body mass index (BMI) 28.0-28.9, adult: Secondary | ICD-10-CM

## 2019-11-02 DIAGNOSIS — E1142 Type 2 diabetes mellitus with diabetic polyneuropathy: Secondary | ICD-10-CM | POA: Diagnosis present

## 2019-11-02 DIAGNOSIS — Z823 Family history of stroke: Secondary | ICD-10-CM

## 2019-11-02 DIAGNOSIS — Z880 Allergy status to penicillin: Secondary | ICD-10-CM

## 2019-11-02 DIAGNOSIS — E079 Disorder of thyroid, unspecified: Secondary | ICD-10-CM | POA: Diagnosis present

## 2019-11-02 DIAGNOSIS — F411 Generalized anxiety disorder: Secondary | ICD-10-CM | POA: Diagnosis present

## 2019-11-02 DIAGNOSIS — Z96641 Presence of right artificial hip joint: Secondary | ICD-10-CM | POA: Diagnosis present

## 2019-11-02 DIAGNOSIS — Z96649 Presence of unspecified artificial hip joint: Secondary | ICD-10-CM

## 2019-11-02 DIAGNOSIS — Z813 Family history of other psychoactive substance abuse and dependence: Secondary | ICD-10-CM

## 2019-11-02 DIAGNOSIS — Z79890 Hormone replacement therapy (postmenopausal): Secondary | ICD-10-CM

## 2019-11-02 DIAGNOSIS — Z809 Family history of malignant neoplasm, unspecified: Secondary | ICD-10-CM

## 2019-11-02 DIAGNOSIS — Z818 Family history of other mental and behavioral disorders: Secondary | ICD-10-CM

## 2019-11-02 DIAGNOSIS — I10 Essential (primary) hypertension: Secondary | ICD-10-CM | POA: Diagnosis present

## 2019-11-02 HISTORY — PX: TOTAL HIP ARTHROPLASTY: SHX124

## 2019-11-02 LAB — GLUCOSE, CAPILLARY
Glucose-Capillary: 195 mg/dL — ABNORMAL HIGH (ref 70–99)
Glucose-Capillary: 181 mg/dL — ABNORMAL HIGH (ref 70–99)

## 2019-11-02 LAB — TYPE AND SCREEN
ABO/RH(D): A POS
Antibody Screen: NEGATIVE

## 2019-11-02 SURGERY — ARTHROPLASTY, HIP, TOTAL, ANTERIOR APPROACH
Anesthesia: Spinal | Site: Hip | Laterality: Left

## 2019-11-02 MED ORDER — MIDAZOLAM HCL 2 MG/2ML IJ SOLN
INTRAMUSCULAR | Status: AC
  Filled 2019-11-02: qty 2

## 2019-11-02 MED ORDER — PHENYLEPHRINE 40 MCG/ML (10ML) SYRINGE FOR IV PUSH (FOR BLOOD PRESSURE SUPPORT)
PREFILLED_SYRINGE | INTRAVENOUS | Status: DC | PRN
  Administered 2019-11-02: 80 ug via INTRAVENOUS

## 2019-11-02 MED ORDER — AMITRIPTYLINE HCL 100 MG PO TABS
200.0000 mg | ORAL_TABLET | Freq: Every day | ORAL | Status: DC
  Administered 2019-11-02 – 2019-11-03 (×2): 200 mg via ORAL
  Filled 2019-11-02 (×2): qty 2

## 2019-11-02 MED ORDER — LEVOTHYROXINE SODIUM 150 MCG PO TABS
150.0000 ug | ORAL_TABLET | Freq: Every day | ORAL | Status: DC
  Administered 2019-11-03 – 2019-11-04 (×2): 150 ug via ORAL
  Filled 2019-11-02: qty 1
  Filled 2019-11-02 (×2): qty 2
  Filled 2019-11-02: qty 1

## 2019-11-02 MED ORDER — MORPHINE SULFATE (PF) 4 MG/ML IV SOLN: 0.5000 mg | INTRAVENOUS | Status: DC | PRN

## 2019-11-02 MED ORDER — ACETAMINOPHEN 325 MG PO TABS
325.0000 mg | ORAL_TABLET | Freq: Four times a day (QID) | ORAL | Status: DC | PRN
  Administered 2019-11-03: 650 mg via ORAL
  Filled 2019-11-02: qty 2

## 2019-11-02 MED ORDER — BISACODYL 10 MG RE SUPP: 10.0000 mg | Freq: Every day | RECTAL | Status: DC | PRN

## 2019-11-02 MED ORDER — CEFAZOLIN SODIUM-DEXTROSE 2-4 GM/100ML-% IV SOLN
2.0000 g | Freq: Four times a day (QID) | INTRAVENOUS | Status: AC
  Administered 2019-11-02 – 2019-11-03 (×2): 2 g via INTRAVENOUS
  Filled 2019-11-02 (×2): qty 100

## 2019-11-02 MED ORDER — METHOCARBAMOL 500 MG IVPB - SIMPLE MED
INTRAVENOUS | Status: AC
  Filled 2019-11-02: qty 50

## 2019-11-02 MED ORDER — METHOCARBAMOL 500 MG PO TABS
500.0000 mg | ORAL_TABLET | Freq: Four times a day (QID) | ORAL | Status: DC | PRN
  Administered 2019-11-02 – 2019-11-03 (×3): 500 mg via ORAL
  Filled 2019-11-02 (×3): qty 1

## 2019-11-02 MED ORDER — FENTANYL CITRATE (PF) 100 MCG/2ML IJ SOLN
INTRAMUSCULAR | Status: AC
  Filled 2019-11-02: qty 2

## 2019-11-02 MED ORDER — PROPOFOL 10 MG/ML IV BOLUS
INTRAVENOUS | Status: DC | PRN
  Administered 2019-11-02: 20 mg via INTRAVENOUS
  Administered 2019-11-02: 30 mg via INTRAVENOUS

## 2019-11-02 MED ORDER — HYDRALAZINE HCL 20 MG/ML IJ SOLN
5.0000 mg | Freq: Four times a day (QID) | INTRAMUSCULAR | Status: DC | PRN
  Administered 2019-11-02: 5 mg via INTRAVENOUS
  Filled 2019-11-02: qty 1

## 2019-11-02 MED ORDER — ONDANSETRON HCL 4 MG/2ML IJ SOLN: 4.0000 mg | Freq: Four times a day (QID) | INTRAMUSCULAR | Status: DC | PRN

## 2019-11-02 MED ORDER — DOCUSATE SODIUM 100 MG PO CAPS
100.0000 mg | ORAL_CAPSULE | Freq: Two times a day (BID) | ORAL | Status: DC
  Administered 2019-11-02 – 2019-11-04 (×4): 100 mg via ORAL
  Filled 2019-11-02 (×4): qty 1

## 2019-11-02 MED ORDER — PROPOFOL 10 MG/ML IV BOLUS
INTRAVENOUS | Status: AC
  Filled 2019-11-02: qty 20

## 2019-11-02 MED ORDER — ONDANSETRON HCL 4 MG/2ML IJ SOLN
INTRAMUSCULAR | Status: DC | PRN
  Administered 2019-11-02: 4 mg via INTRAVENOUS

## 2019-11-02 MED ORDER — PROPOFOL 500 MG/50ML IV EMUL
INTRAVENOUS | Status: DC | PRN
  Administered 2019-11-02: 50 ug/kg/min via INTRAVENOUS

## 2019-11-02 MED ORDER — SODIUM CHLORIDE 0.9 % IV SOLN: INTRAVENOUS | Status: DC

## 2019-11-02 MED ORDER — MENTHOL 3 MG MT LOZG: 1.0000 | LOZENGE | OROMUCOSAL | Status: DC | PRN

## 2019-11-02 MED ORDER — HYDROXYZINE HCL 25 MG PO TABS: 12.5000 mg | ORAL_TABLET | Freq: Three times a day (TID) | ORAL | Status: DC | PRN

## 2019-11-02 MED ORDER — ACETAMINOPHEN 10 MG/ML IV SOLN
1000.0000 mg | Freq: Four times a day (QID) | INTRAVENOUS | Status: DC
  Administered 2019-11-02: 1000 mg via INTRAVENOUS
  Filled 2019-11-02: qty 100

## 2019-11-02 MED ORDER — CEFAZOLIN SODIUM-DEXTROSE 2-4 GM/100ML-% IV SOLN
2.0000 g | INTRAVENOUS | Status: AC
  Administered 2019-11-02: 2 g via INTRAVENOUS
  Filled 2019-11-02: qty 100

## 2019-11-02 MED ORDER — METHOCARBAMOL 500 MG IVPB - SIMPLE MED
500.0000 mg | Freq: Four times a day (QID) | INTRAVENOUS | Status: DC | PRN
  Administered 2019-11-02: 16:00:00 500 mg via INTRAVENOUS
  Filled 2019-11-02: qty 50

## 2019-11-02 MED ORDER — MAGNESIUM CITRATE PO SOLN: 1.0000 | Freq: Once | ORAL | Status: DC | PRN

## 2019-11-02 MED ORDER — OXYCODONE HCL 5 MG PO TABS
10.0000 mg | ORAL_TABLET | ORAL | Status: DC | PRN
  Administered 2019-11-02: 15 mg via ORAL
  Filled 2019-11-02: qty 2
  Filled 2019-11-02: qty 3

## 2019-11-02 MED ORDER — FENTANYL CITRATE (PF) 100 MCG/2ML IJ SOLN
INTRAMUSCULAR | Status: DC | PRN
  Administered 2019-11-02: 100 ug via INTRAVENOUS

## 2019-11-02 MED ORDER — TRAMADOL HCL 50 MG PO TABS
50.0000 mg | ORAL_TABLET | Freq: Four times a day (QID) | ORAL | Status: DC | PRN
  Administered 2019-11-02: 100 mg via ORAL
  Filled 2019-11-02: qty 2

## 2019-11-02 MED ORDER — METOCLOPRAMIDE HCL 5 MG/ML IJ SOLN: 5.0000 mg | Freq: Three times a day (TID) | INTRAMUSCULAR | Status: DC | PRN

## 2019-11-02 MED ORDER — MIDAZOLAM HCL 2 MG/2ML IJ SOLN
INTRAMUSCULAR | Status: DC | PRN
  Administered 2019-11-02: 2 mg via INTRAVENOUS

## 2019-11-02 MED ORDER — ONDANSETRON HCL 4 MG PO TABS: 4.0000 mg | ORAL_TABLET | Freq: Four times a day (QID) | ORAL | Status: DC | PRN

## 2019-11-02 MED ORDER — BUPIVACAINE HCL 0.25 % IJ SOLN
INTRAMUSCULAR | Status: AC
  Filled 2019-11-02: qty 1

## 2019-11-02 MED ORDER — POLYETHYLENE GLYCOL 3350 17 G PO PACK: 17.0000 g | PACK | Freq: Every day | ORAL | Status: DC | PRN

## 2019-11-02 MED ORDER — BUPIVACAINE HCL 0.25 % IJ SOLN
INTRAMUSCULAR | Status: DC | PRN
  Administered 2019-11-02: 30 mL

## 2019-11-02 MED ORDER — 0.9 % SODIUM CHLORIDE (POUR BTL) OPTIME
TOPICAL | Status: DC | PRN
  Administered 2019-11-02: 15:00:00 1000 mL

## 2019-11-02 MED ORDER — LACTATED RINGERS IV SOLN: INTRAVENOUS | Status: DC

## 2019-11-02 MED ORDER — METOCLOPRAMIDE HCL 5 MG PO TABS: 5.0000 mg | ORAL_TABLET | Freq: Three times a day (TID) | ORAL | Status: DC | PRN

## 2019-11-02 MED ORDER — BUPIVACAINE IN DEXTROSE 0.75-8.25 % IT SOLN
INTRATHECAL | Status: DC | PRN
Start: 2019-11-02 — End: 2019-11-02
  Administered 2019-11-02: 1.6 mL via INTRATHECAL

## 2019-11-02 MED ORDER — DEXAMETHASONE SODIUM PHOSPHATE 10 MG/ML IJ SOLN
INTRAMUSCULAR | Status: DC | PRN
  Administered 2019-11-02: 10 mg via INTRAVENOUS

## 2019-11-02 MED ORDER — PHENOL 1.4 % MT LIQD: 1.0000 | OROMUCOSAL | Status: DC | PRN

## 2019-11-02 MED ORDER — POVIDONE-IODINE 10 % EX SWAB
2.0000 "application " | Freq: Once | CUTANEOUS | Status: AC
  Administered 2019-11-02: 2 via TOPICAL

## 2019-11-02 MED ORDER — LIDOCAINE 2% (20 MG/ML) 5 ML SYRINGE
INTRAMUSCULAR | Status: AC
  Filled 2019-11-02: qty 5

## 2019-11-02 MED ORDER — MORPHINE SULFATE (PF) 2 MG/ML IV SOLN
0.5000 mg | INTRAVENOUS | Status: DC | PRN
  Administered 2019-11-02: 1 mg via INTRAVENOUS
  Filled 2019-11-02: qty 1

## 2019-11-02 MED ORDER — ASPIRIN EC 325 MG PO TBEC
325.0000 mg | DELAYED_RELEASE_TABLET | Freq: Two times a day (BID) | ORAL | Status: DC
  Administered 2019-11-03 – 2019-11-04 (×3): 325 mg via ORAL
  Filled 2019-11-02 (×3): qty 1

## 2019-11-02 MED ORDER — DEXAMETHASONE SODIUM PHOSPHATE 10 MG/ML IJ SOLN
INTRAMUSCULAR | Status: AC
  Filled 2019-11-02: qty 1

## 2019-11-02 MED ORDER — ONDANSETRON HCL 4 MG/2ML IJ SOLN
INTRAMUSCULAR | Status: AC
  Filled 2019-11-02: qty 2

## 2019-11-02 MED ORDER — DEXAMETHASONE SODIUM PHOSPHATE 10 MG/ML IJ SOLN
10.0000 mg | Freq: Once | INTRAMUSCULAR | Status: AC
  Administered 2019-11-03: 10 mg via INTRAVENOUS
  Filled 2019-11-02: qty 1

## 2019-11-02 MED ORDER — WATER FOR IRRIGATION, STERILE IR SOLN
Status: DC | PRN
  Administered 2019-11-02: 2000 mL

## 2019-11-02 MED ORDER — TRANEXAMIC ACID-NACL 1000-0.7 MG/100ML-% IV SOLN
1000.0000 mg | INTRAVENOUS | Status: AC
  Administered 2019-11-02: 1000 mg via INTRAVENOUS
  Filled 2019-11-02: qty 100

## 2019-11-02 MED ORDER — OXYCODONE HCL 5 MG PO TABS
5.0000 mg | ORAL_TABLET | ORAL | Status: DC | PRN
  Administered 2019-11-02 – 2019-11-03 (×3): 10 mg via ORAL
  Administered 2019-11-03: 5 mg via ORAL
  Administered 2019-11-03 – 2019-11-04 (×4): 10 mg via ORAL
  Filled 2019-11-02 (×5): qty 2
  Filled 2019-11-02: qty 1
  Filled 2019-11-02: qty 2

## 2019-11-02 MED ORDER — DEXAMETHASONE SODIUM PHOSPHATE 10 MG/ML IJ SOLN: 8.0000 mg | Freq: Once | INTRAMUSCULAR | Status: DC

## 2019-11-02 SURGICAL SUPPLY — 49 items
BAG DECANTER FOR FLEXI CONT (MISCELLANEOUS) IMPLANT
BAG SPEC THK2 15X12 ZIP CLS (MISCELLANEOUS)
BAG ZIPLOCK 12X15 (MISCELLANEOUS) IMPLANT
BLADE SAG 18X100X1.27 (BLADE) ×3 IMPLANT
CLOSURE STERI-STRIP 1/2X4 (GAUZE/BANDAGES/DRESSINGS) ×2
CLOSURE WOUND 1/2 X4 (GAUZE/BANDAGES/DRESSINGS) ×1
CLSR STERI-STRIP ANTIMIC 1/2X4 (GAUZE/BANDAGES/DRESSINGS) ×2 IMPLANT
COVER PERINEAL POST (MISCELLANEOUS) ×3 IMPLANT
COVER SURGICAL LIGHT HANDLE (MISCELLANEOUS) ×3 IMPLANT
COVER WAND RF STERILE (DRAPES) IMPLANT
CUP ACETBLR 48 OD SECTOR II (Hips) ×2 IMPLANT
DECANTER SPIKE VIAL GLASS SM (MISCELLANEOUS) ×3 IMPLANT
DRAPE STERI IOBAN 125X83 (DRAPES) ×3 IMPLANT
DRAPE U-SHAPE 47X51 STRL (DRAPES) ×6 IMPLANT
DRSG ADAPTIC 3X8 NADH LF (GAUZE/BANDAGES/DRESSINGS) ×3 IMPLANT
DRSG AQUACEL AG ADV 3.5X10 (GAUZE/BANDAGES/DRESSINGS) ×3 IMPLANT
DURAPREP 26ML APPLICATOR (WOUND CARE) ×3 IMPLANT
ELECT REM PT RETURN 15FT ADLT (MISCELLANEOUS) ×3 IMPLANT
EVACUATOR 1/8 PVC DRAIN (DRAIN) IMPLANT
GLOVE BIO SURGEON STRL SZ 6 (GLOVE) IMPLANT
GLOVE BIO SURGEON STRL SZ7 (GLOVE) IMPLANT
GLOVE BIO SURGEON STRL SZ8 (GLOVE) ×3 IMPLANT
GLOVE BIOGEL PI IND STRL 6.5 (GLOVE) IMPLANT
GLOVE BIOGEL PI IND STRL 7.0 (GLOVE) IMPLANT
GLOVE BIOGEL PI IND STRL 8 (GLOVE) ×1 IMPLANT
GLOVE BIOGEL PI INDICATOR 6.5 (GLOVE)
GLOVE BIOGEL PI INDICATOR 7.0 (GLOVE)
GLOVE BIOGEL PI INDICATOR 8 (GLOVE) ×2
GOWN STRL REUS W/TWL LRG LVL3 (GOWN DISPOSABLE) ×3 IMPLANT
GOWN STRL REUS W/TWL XL LVL3 (GOWN DISPOSABLE) IMPLANT
HEAD CERAMIC DELTA 28M 12/14P5 (Head) ×2 IMPLANT
HOLDER FOLEY CATH W/STRAP (MISCELLANEOUS) ×3 IMPLANT
KIT TURNOVER KIT A (KITS) IMPLANT
LINER MARATHON 28 48 (Hips) ×3 IMPLANT
MANIFOLD NEPTUNE II (INSTRUMENTS) ×3 IMPLANT
PACK ANTERIOR HIP CUSTOM (KITS) ×3 IMPLANT
PENCIL SMOKE EVACUATOR COATED (MISCELLANEOUS) ×3 IMPLANT
SCREW 6.5MMX30MM (Screw) ×2 IMPLANT
STEM FEM ACTIS HIGH SZ2 (Stem) ×2 IMPLANT
STRIP CLOSURE SKIN 1/2X4 (GAUZE/BANDAGES/DRESSINGS) ×2 IMPLANT
SUT ETHIBOND NAB CT1 #1 30IN (SUTURE) ×3 IMPLANT
SUT MNCRL AB 4-0 PS2 18 (SUTURE) ×3 IMPLANT
SUT STRATAFIX 0 PDS 27 VIOLET (SUTURE) ×3
SUT VIC AB 2-0 CT1 27 (SUTURE) ×6
SUT VIC AB 2-0 CT1 TAPERPNT 27 (SUTURE) ×2 IMPLANT
SUTURE STRATFX 0 PDS 27 VIOLET (SUTURE) ×1 IMPLANT
SYR 50ML LL SCALE MARK (SYRINGE) IMPLANT
TRAY FOLEY MTR SLVR 14FR STAT (SET/KITS/TRAYS/PACK) ×2 IMPLANT
YANKAUER SUCT BULB TIP 10FT TU (MISCELLANEOUS) ×3 IMPLANT

## 2019-11-02 NOTE — Transfer of Care (Signed)
Immediate Anesthesia Transfer of Care Note  Patient: Taylor Benitez  Procedure(s) Performed: TOTAL HIP ARTHROPLASTY ANTERIOR APPROACH (Left Hip)  Patient Location: PACU  Anesthesia Type:Spinal  Level of Consciousness: awake and alert   Airway & Oxygen Therapy: Patient Spontanous Breathing and Patient connected to face mask oxygen  Post-op Assessment: Report given to RN and Post -op Vital signs reviewed and stable  Post vital signs: Reviewed and stable  Last Vitals:  Vitals Value Taken Time  BP 148/75 11/02/19 1548  Temp    Pulse 83 11/02/19 1549  Resp 9 11/02/19 1549  SpO2 100 % 11/02/19 1549  Vitals shown include unvalidated device data.  Last Pain:  Vitals:   11/02/19 1220  TempSrc: Oral  PainSc: 0-No pain         Complications: No apparent anesthesia complications

## 2019-11-02 NOTE — Anesthesia Procedure Notes (Signed)
Spinal  Patient location during procedure: OR Start time: 11/02/2019 2:07 PM End time: 11/02/2019 2:17 PM Staffing Performed: anesthesiologist  Anesthesiologist: Heather Roberts, MD Preanesthetic Checklist Completed: patient identified, IV checked, risks and benefits discussed, surgical consent, monitors and equipment checked, pre-op evaluation and timeout performed Spinal Block Patient position: sitting Prep: DuraPrep Patient monitoring: cardiac monitor, continuous pulse ox and blood pressure Approach: midline Location: L2-3 Injection technique: single-shot Needle Needle type: Pencan  Needle gauge: 24 G Needle length: 9 cm Additional Notes Functioning IV was confirmed and monitors were applied. Sterile prep and drape, including hand hygiene and sterile gloves were used. The patient was positioned and the spine was prepped. The skin was anesthetized with lidocaine.  Free flow of clear CSF was obtained prior to injecting local anesthetic into the CSF.  The spinal needle aspirated freely following injection.  The needle was carefully withdrawn.  The patient tolerated the procedure well.

## 2019-11-02 NOTE — Op Note (Signed)
OPERATIVE REPORT- TOTAL HIP ARTHROPLASTY   PREOPERATIVE DIAGNOSIS: Osteoarthritis of the Left hip.   POSTOPERATIVE DIAGNOSIS: Osteoarthritis of the Left  hip.   PROCEDURE: Left total hip arthroplasty, anterior approach.   SURGEON: Ollen Gross, MD   ASSISTANT: Arther Abbott, PA-C  ANESTHESIA:  Spinal  ESTIMATED BLOOD LOSS:-250 mL    DRAINS: Hemovac x1.   COMPLICATIONS: None   CONDITION: PACU - hemodynamically stable.   BRIEF CLINICAL NOTE: Taylor Benitez is a 59 y.o. female who has advanced end-  stage arthritis of their Left  hip with progressively worsening pain and  dysfunction.The patient has failed nonoperative management and presents for  total hip arthroplasty.   PROCEDURE IN DETAIL: After successful administration of spinal  anesthetic, the traction boots for the [facility] bed were placed on both  feet and the patient was placed onto the [facility] bed, boots placed into the leg  holders. The Left hip was then isolated from the perineum with plastic  drapes and prepped and draped in the usual sterile fashion. ASIS and  greater trochanter were marked and a oblique incision was made, starting  at about 1 cm lateral and 2 cm distal to the ASIS and coursing towards  the anterior cortex of the femur. The skin was cut with a 10 blade  through subcutaneous tissue to the level of the fascia overlying the  tensor fascia lata muscle. The fascia was then incised in line with the  incision at the junction of the anterior third and posterior 2/3rd. The  muscle was teased off the fascia and then the interval between the TFL  and the rectus was developed. The Hohmann retractor was then placed at  the top of the femoral neck over the capsule. The vessels overlying the  capsule were cauterized and the fat on top of the capsule was removed.  A Hohmann retractor was then placed anterior underneath the rectus  femoris to give exposure to the entire anterior capsule. A T-shaped   capsulotomy was performed. The edges were tagged and the femoral head  was identified.       Osteophytes are removed off the superior acetabulum.  The femoral neck was then cut in situ with an oscillating saw. Traction  was then applied to the left lower extremity utilizing the [facility]  traction. The femoral head was then removed. Retractors were placed  around the acetabulum and then circumferential removal of the labrum was  performed. Osteophytes were also removed. Reaming starts at 45 mm to  medialize and  Increased in 2 mm increments to 47 mm. We reamed in  approximately 40 degrees of abduction, 20 degrees anteversion. A 48 mm  pinnacle acetabular shell was then impacted in anatomic position under  fluoroscopic guidance with excellent purchase. We  placed an additional dome screws.  A 28 mm neutral + 4 marathon liner was then  placed into the acetabular shell.       The femoral lift was then placed along the lateral aspect of the femur  just distal to the vastus ridge. The leg was  externally rotated and capsule  was stripped off the inferior aspect of the femoral neck down to the  level of the lesser trochanter, this was done with electrocautery. The femur was lifted after this was performed. The  leg was then placed in an extended and adducted position essentially delivering the femur. We also removed the capsule superiorly and the piriformis from the piriformis fossa to gain  excellent exposure of the  proximal femur. Rongeur was used to remove some cancellous bone to get  into the lateral portion of the proximal femur for placement of the  initial starter reamer. The starter broaches was placed  the starter broach  and was shown to go down the center of the canal. Broaching  with the Actis system was then performed starting at size 0  coursing  Up to size 2. A size 2 had excellent torsional and rotational  and axial stability. The trial high offset neck was then placed  with a 28 + 5  trial head. The hip was then reduced. We confirmed that  the stem was in the canal both on AP and lateral x-rays. It also has excellent sizing. The hip was reduced with outstanding stability through full extension and full external rotation.. AP pelvis was taken and the leg lengths were measured and found to be equal. Hip was then dislocated again and the femoral head and neck removed. The  femoral broach was removed. Size 2 Actis stem with a high offset  neck was then impacted into the femur following native anteversion. Has  excellent purchase in the canal. Excellent torsional and rotational and  axial stability. It is confirmed to be in the canal on AP and lateral  fluoroscopic views. The 28 + 5 ceramic head was placed and the hip  reduced with outstanding stability. Again AP pelvis was taken and it  confirmed that the leg lengths were equal. The wound was then copiously  irrigated with saline solution and the capsule reattached and repaired  with Ethibond suture. 30 ml of .25% Bupivicaine was  injected into the capsule and into the edge of the tensor fascia lata as well as subcutaneous tissue. The fascia overlying the tensor fascia lata was then closed with a running #1 V-Loc. Subcu was closed with interrupted 2-0 Vicryl and subcuticular running 4-0 Monocryl. Incision was cleaned  and dried. Steri-Strips and a bulky sterile dressing applied. Hemovac  drain was hooked to suction and then the patient was awakened and transported to  recovery in stable condition.        Please note that a surgical assistant was a medical necessity for this procedure to perform it in a safe and expeditious manner. Assistant was necessary to provide appropriate retraction of vital neurovascular structures and to prevent femoral fracture and allow for anatomic placement of the prosthesis.  Gaynelle Arabian, M.D.

## 2019-11-02 NOTE — Anesthesia Procedure Notes (Signed)
Date/Time: 11/02/2019 2:11 PM Performed by: Florene Route, CRNA Oxygen Delivery Method: Simple face mask

## 2019-11-02 NOTE — Anesthesia Preprocedure Evaluation (Addendum)
Anesthesia Evaluation  Patient identified by MRN, date of birth, ID band Patient awake    Reviewed: Allergy & Precautions, NPO status , Patient's Chart, lab work & pertinent test results  History of Anesthesia Complications (+) AWARENESS UNDER ANESTHESIA  Airway Mallampati: I  TM Distance: >3 FB Neck ROM: Full    Dental  (+) Edentulous Upper, Edentulous Lower, Dental Advisory Given   Pulmonary former smoker,    Pulmonary exam normal        Cardiovascular hypertension, Pt. on medications Normal cardiovascular exam     Neuro/Psych PSYCHIATRIC DISORDERS Anxiety negative neurological ROS     GI/Hepatic negative GI ROS, (+) Cirrhosis       ,   Endo/Other  diabetesHypothyroidism   Renal/GU negative Renal ROS     Musculoskeletal negative musculoskeletal ROS (+)   Abdominal   Peds  Hematology negative hematology ROS (+)   Anesthesia Other Findings Day of surgery medications reviewed with the patient.  Reproductive/Obstetrics                            Anesthesia Physical Anesthesia Plan  ASA: III  Anesthesia Plan: Spinal   Post-op Pain Management:    Induction:   PONV Risk Score and Plan: 3 and Ondansetron, Dexamethasone and Propofol infusion  Airway Management Planned: Natural Airway  Additional Equipment:   Intra-op Plan:   Post-operative Plan:   Informed Consent: I have reviewed the patients History and Physical, chart, labs and discussed the procedure including the risks, benefits and alternatives for the proposed anesthesia with the patient or authorized representative who has indicated his/her understanding and acceptance.     Dental advisory given  Plan Discussed with: Anesthesiologist and CRNA  Anesthesia Plan Comments:        Anesthesia Quick Evaluation

## 2019-11-02 NOTE — Anesthesia Postprocedure Evaluation (Signed)
Anesthesia Post Note  Patient: Taylor Benitez  Procedure(s) Performed: TOTAL HIP ARTHROPLASTY ANTERIOR APPROACH (Left Hip)     Patient location during evaluation: PACU Anesthesia Type: Spinal Level of consciousness: awake and alert Pain management: pain level controlled Vital Signs Assessment: post-procedure vital signs reviewed and stable Respiratory status: spontaneous breathing and respiratory function stable Cardiovascular status: blood pressure returned to baseline and stable Postop Assessment: spinal receding Anesthetic complications: no    Last Vitals:  Vitals:   11/02/19 1630 11/02/19 1654  BP: (!) 189/94 (!) 190/100  Pulse: 88 88  Resp: 15 17  Temp: 36.4 C 36.6 C  SpO2: 99% 100%    Last Pain:  Vitals:   11/02/19 1700  TempSrc:   PainSc: 9                  Akaysha Cobern DANIEL

## 2019-11-02 NOTE — Interval H&P Note (Signed)
History and Physical Interval Note:  11/02/2019 12:41 PM  Taylor Benitez  has presented today for surgery, with the diagnosis of left hip osteoarthritis.  The various methods of treatment have been discussed with the patient and family. After consideration of risks, benefits and other options for treatment, the patient has consented to  Procedure(s) with comments: TOTAL HIP ARTHROPLASTY ANTERIOR APPROACH (Left) - as a surgical intervention.  The patient's history has been reviewed, patient examined, no change in status, stable for surgery.  I have reviewed the patient's chart and labs.  Questions were answered to the patient's satisfaction.     Homero Fellers Harvy Riera

## 2019-11-02 NOTE — Discharge Instructions (Addendum)
Taylor Aluisio, MD Total Joint Specialist [facility] [address] [place], [place] [address] [phone]  ANTERIOR APPROACH TOTAL HIP REPLACEMENT POSTOPERATIVE DIRECTIONS     Hip Rehabilitation, Guidelines Following Surgery  The results of a hip operation are greatly improved after range of motion and muscle strengthening exercises. Follow all safety measures which are given to protect your hip. If any of these exercises cause increased pain or swelling in your joint, decrease the amount until you are comfortable again. Then slowly increase the exercises. Call your caregiver if you have problems or questions.   BLOOD CLOT PREVENTION . Take a 325 mg Aspirin two times a day for three weeks following surgery. Then take an 81 mg Aspirin once a day for three weeks. Then discontinue Aspirin. . You may resume your vitamins/supplements upon discharge from the hospital. . Do not take any NSAIDs (Advil, Aleve, Ibuprofen, Meloxicam, etc.) until you have discontinued the 325 mg Aspirin.  HOME CARE INSTRUCTIONS  . Remove items at home which could result in a fall. This includes throw rugs or furniture in walking pathways.   ICE to the affected hip as frequently as 20-30 minutes an hour and then as needed for pain and swelling. Continue to use ice on the hip for pain and swelling from surgery. You may notice swelling that will progress down to the foot and ankle. This is normal after surgery. Elevate the leg when you are not up walking on it.    Continue to use the breathing machine which will help keep your temperature down.  It is common for your temperature to cycle up and down following surgery, especially at night when you are not up moving around and exerting yourself.  The breathing machine keeps your lungs expanded and your temperature down.  DIET You may resume your previous home diet once your are discharged from the hospital.  DRESSING / WOUND CARE /  SHOWERING . You have an adhesive waterproof bandage over the incision. Leave this in place until your first follow-up appointment. Once you remove this you will not need to place another bandage.  . You may begin showering 3 days following surgery, but do not submerge the incision under water.  ACTIVITY . For the first 3-5 days, it is important to rest and keep the operative leg elevated. You should, as a general rule, rest for 50 minutes and walk/stretch for 10 minutes per hour. After 5 days, you may slowly increase activity as tolerated.  . Perform the exercises you were provided twice a day for about 15-20 minutes each session. Begin these 2 days following surgery. . Walk with your walker as instructed. Use the walker until you are comfortable transitioning to a cane. Walk with the cane in the opposite hand of the operative leg. You may discontinue the cane once you are comfortable and walking steadily. . Avoid periods of inactivity such as sitting longer than an hour when not asleep. This helps prevent blood clots.  . Do not drive a car for 6 weeks or until released by your surgeon.  . Do not drive while taking narcotics.  TED HOSE STOCKINGS Wear the elastic stockings on both legs for three weeks following surgery during the day. You may remove them at night while sleeping.  WEIGHT BEARING Weight bearing as tolerated with assist device (walker, cane, etc) as directed, use it as long as suggested by your surgeon or therapist, typically at least 4-6 weeks.  POSTOPERATIVE CONSTIPATION PROTOCOL Constipation -   defined medically as fewer than three stools per week and severe constipation as less than one stool per week.  One of the most common issues patients have following surgery is constipation.  Even if you have a regular bowel pattern at home, your normal regimen is likely to be disrupted due to multiple reasons following surgery.  Combination of anesthesia, postoperative narcotics, change in  appetite and fluid intake all can affect your bowels.  In order to avoid complications following surgery, here are some recommendations in order to help you during your recovery period.  . Colace (docusate) - Pick up an over-the-counter form of Colace or another stool softener and take twice a day as long as you are requiring postoperative pain medications.  Take with a full glass of water daily.  If you experience loose stools or diarrhea, hold the colace until you stool forms back up.  If your symptoms do not get better within 1 week or if they get worse, check with your doctor. . Dulcolax (bisacodyl) - Pick up over-the-counter and take as directed by the product packaging as needed to assist with the movement of your bowels.  Take with a full glass of water.  Use this product as needed if not relieved by Colace only.  . MiraLax (polyethylene glycol) - Pick up over-the-counter to have on hand.  MiraLax is a solution that will increase the amount of water in your bowels to assist with bowel movements.  Take as directed and can mix with a glass of water, juice, soda, coffee, or tea.  Take if you go more than two days without a movement.Do not use MiraLax more than once per day. Call your doctor if you are still constipated or irregular after using this medication for 7 days in a row.  If you continue to have problems with postoperative constipation, please contact the office for further assistance and recommendations.  If you experience "the worst abdominal pain ever" or develop nausea or vomiting, please contact the office immediatly for further recommendations for treatment.  ITCHING  If you experience itching with your medications, try taking only a single pain pill, or even half a pain pill at a time.  You can also use Benadryl over the counter for itching or also to help with sleep.   MEDICATIONS See your medication summary on the "After Visit Summary" that the nursing staff will review with you  prior to discharge.  You may have some home medications which will be placed on hold until you complete the course of blood thinner medication.  It is important for you to complete the blood thinner medication as prescribed by your surgeon.  Continue your approved medications as instructed at time of discharge.  PRECAUTIONS If you experience chest pain or shortness of breath - call 911 immediately for transfer to the hospital emergency department.  If you develop a fever greater that 101 F, purulent drainage from wound, increased redness or drainage from wound, foul odor from the wound/dressing, or calf pain - CONTACT YOUR SURGEON.                                                   FOLLOW-UP APPOINTMENTS Make sure you keep all of your appointments after your operation with your surgeon and caregivers. You should call the office at the above phone number and   make an appointment for approximately two weeks after the date of your surgery or on the date instructed by your surgeon outlined in the "After Visit Summary".  RANGE OF MOTION AND STRENGTHENING EXERCISES  These exercises are designed to help you keep full movement of your hip joint. Follow your caregiver's or physical therapist's instructions. Perform all exercises about fifteen times, three times per day or as directed. Exercise both hips, even if you have had only one joint replacement. These exercises can be done on a training (exercise) mat, on the floor, on a table or on a bed. Use whatever works the best and is most comfortable for you. Use music or television while you are exercising so that the exercises are a pleasant break in your day. This will make your life better with the exercises acting as a break in routine you can look forward to.  . Lying on your back, slowly slide your foot toward your buttocks, raising your knee up off the floor. Then slowly slide your foot back down until your leg is straight again.  . Lying on your back spread  your legs as far apart as you can without causing discomfort.  . Lying on your side, raise your upper leg and foot straight up from the floor as far as is comfortable. Slowly lower the leg and repeat.  . Lying on your back, tighten up the muscle in the front of your thigh (quadriceps muscles). You can do this by keeping your leg straight and trying to raise your heel off the floor. This helps strengthen the largest muscle supporting your knee.  . Lying on your back, tighten up the muscles of your buttocks both with the legs straight and with the knee bent at a comfortable angle while keeping your heel on the floor.   IF YOU ARE TRANSFERRED TO A SKILLED REHAB FACILITY If the patient is transferred to a skilled rehab facility following release from the hospital, a list of the current medications will be sent to the facility for the patient to continue.  When discharged from the skilled rehab facility, please have the facility set up the patient's Home Health Physical Therapy prior to being released. Also, the skilled facility will be responsible for providing the patient with their medications at time of release from the facility to include their pain medication, the muscle relaxants, and their blood thinner medication. If the patient is still at the rehab facility at time of the two week follow up appointment, the skilled rehab facility will also need to assist the patient in arranging follow up appointment in our office and any transportation needs.  MAKE SURE YOU:  . Understand these instructions.  . Get help right away if you are not doing well or get worse.    DENTAL ANTIBIOTICS:  In most cases prophylactic antibiotics for Dental procdeures after total joint surgery are not necessary.  Exceptions are as follows:  1. History of prior total joint infection  2. Severely immunocompromised (Organ Transplant, cancer chemotherapy, Rheumatoid biologic meds such as Humera)  3. Poorly controlled  diabetes (A1C &gt; 8.0, blood glucose over 200)  If you have one of these conditions, contact your surgeon for an antibiotic prescription, prior to your dental procedure.    Pick up stool softner and laxative for home use following surgery while on pain medications. Do not submerge incision under water. Please use good hand washing techniques while changing dressing each day. May shower starting three days after surgery. Please   use a clean towel to pat the incision dry following showers. Continue to use ice for pain and swelling after surgery. Do not use any lotions or creams on the incision until instructed by your surgeon.  

## 2019-11-03 ENCOUNTER — Encounter: Payer: Self-pay | Admitting: *Deleted

## 2019-11-03 DIAGNOSIS — Z6828 Body mass index (BMI) 28.0-28.9, adult: Secondary | ICD-10-CM | POA: Diagnosis not present

## 2019-11-03 DIAGNOSIS — M0600 Rheumatoid arthritis without rheumatoid factor, unspecified site: Secondary | ICD-10-CM | POA: Diagnosis present

## 2019-11-03 DIAGNOSIS — Z87891 Personal history of nicotine dependence: Secondary | ICD-10-CM | POA: Diagnosis not present

## 2019-11-03 DIAGNOSIS — Z96641 Presence of right artificial hip joint: Secondary | ICD-10-CM | POA: Diagnosis present

## 2019-11-03 DIAGNOSIS — I10 Essential (primary) hypertension: Secondary | ICD-10-CM | POA: Diagnosis present

## 2019-11-03 DIAGNOSIS — E079 Disorder of thyroid, unspecified: Secondary | ICD-10-CM | POA: Diagnosis present

## 2019-11-03 DIAGNOSIS — Z809 Family history of malignant neoplasm, unspecified: Secondary | ICD-10-CM | POA: Diagnosis not present

## 2019-11-03 DIAGNOSIS — F411 Generalized anxiety disorder: Secondary | ICD-10-CM | POA: Diagnosis present

## 2019-11-03 DIAGNOSIS — Z833 Family history of diabetes mellitus: Secondary | ICD-10-CM | POA: Diagnosis not present

## 2019-11-03 DIAGNOSIS — Z880 Allergy status to penicillin: Secondary | ICD-10-CM | POA: Diagnosis not present

## 2019-11-03 DIAGNOSIS — Z96653 Presence of artificial knee joint, bilateral: Secondary | ICD-10-CM | POA: Diagnosis present

## 2019-11-03 DIAGNOSIS — Z83438 Family history of other disorder of lipoprotein metabolism and other lipidemia: Secondary | ICD-10-CM | POA: Diagnosis not present

## 2019-11-03 DIAGNOSIS — Z818 Family history of other mental and behavioral disorders: Secondary | ICD-10-CM | POA: Diagnosis not present

## 2019-11-03 DIAGNOSIS — Z79890 Hormone replacement therapy (postmenopausal): Secondary | ICD-10-CM | POA: Diagnosis not present

## 2019-11-03 DIAGNOSIS — Z825 Family history of asthma and other chronic lower respiratory diseases: Secondary | ICD-10-CM | POA: Diagnosis not present

## 2019-11-03 DIAGNOSIS — Z8261 Family history of arthritis: Secondary | ICD-10-CM | POA: Diagnosis not present

## 2019-11-03 DIAGNOSIS — Z813 Family history of other psychoactive substance abuse and dependence: Secondary | ICD-10-CM | POA: Diagnosis not present

## 2019-11-03 DIAGNOSIS — M1612 Unilateral primary osteoarthritis, left hip: Secondary | ICD-10-CM | POA: Diagnosis present

## 2019-11-03 DIAGNOSIS — E1142 Type 2 diabetes mellitus with diabetic polyneuropathy: Secondary | ICD-10-CM | POA: Diagnosis present

## 2019-11-03 DIAGNOSIS — E669 Obesity, unspecified: Secondary | ICD-10-CM | POA: Diagnosis present

## 2019-11-03 DIAGNOSIS — Z8249 Family history of ischemic heart disease and other diseases of the circulatory system: Secondary | ICD-10-CM | POA: Diagnosis not present

## 2019-11-03 DIAGNOSIS — Z823 Family history of stroke: Secondary | ICD-10-CM | POA: Diagnosis not present

## 2019-11-03 DIAGNOSIS — F1120 Opioid dependence, uncomplicated: Secondary | ICD-10-CM | POA: Diagnosis present

## 2019-11-03 LAB — BASIC METABOLIC PANEL
Anion gap: 12 (ref 5–15)
BUN: 19 mg/dL (ref 6–20)
CO2: 23 mmol/L (ref 22–32)
Calcium: 8.6 mg/dL — ABNORMAL LOW (ref 8.9–10.3)
Chloride: 98 mmol/L (ref 98–111)
Creatinine, Ser: 0.8 mg/dL (ref 0.44–1.00)
GFR calc Af Amer: >60 mL/min (ref >60)
GFR calc non Af Amer: >60 mL/min (ref >60)
Glucose, Bld: 441 mg/dL — ABNORMAL HIGH (ref 70–99)
Potassium: 4.2 mmol/L (ref 3.5–5.1)
Sodium: 133 mmol/L — ABNORMAL LOW (ref 135–145)

## 2019-11-03 LAB — CBC
HCT: 29.1 % — ABNORMAL LOW (ref 36.0–46.0)
Hemoglobin: 9.5 g/dL — ABNORMAL LOW (ref 12.0–15.0)
MCH: 27.4 pg (ref 26.0–34.0)
MCHC: 32.6 g/dL (ref 30.0–36.0)
MCV: 83.9 fL (ref 80.0–100.0)
Platelets: 141 10*3/uL — ABNORMAL LOW (ref 150–400)
RBC: 3.47 MIL/uL — ABNORMAL LOW (ref 3.87–5.11)
RDW: 14.8 % (ref 11.5–15.5)
WBC: 7.2 10*3/uL (ref 4.0–10.5)
nRBC: 0 % (ref 0.0–0.2)

## 2019-11-03 LAB — GLUCOSE, CAPILLARY
Glucose-Capillary: 305 mg/dL — ABNORMAL HIGH (ref 70–99)
Glucose-Capillary: 295 mg/dL — ABNORMAL HIGH (ref 70–99)
Glucose-Capillary: 392 mg/dL — ABNORMAL HIGH (ref 70–99)
Glucose-Capillary: 282 mg/dL — ABNORMAL HIGH (ref 70–99)

## 2019-11-03 MED ORDER — HYDRALAZINE HCL 25 MG PO TABS: 25.0000 mg | ORAL_TABLET | Freq: Four times a day (QID) | ORAL | Status: DC | PRN

## 2019-11-03 MED ORDER — DIPHENHYDRAMINE HCL 25 MG PO CAPS
25.0000 mg | ORAL_CAPSULE | Freq: Four times a day (QID) | ORAL | Status: DC | PRN
  Administered 2019-11-03: 25 mg via ORAL
  Filled 2019-11-03: qty 1

## 2019-11-03 MED ORDER — INSULIN ASPART 100 UNIT/ML ~~LOC~~ SOLN: 0.0000 [IU] | Freq: Three times a day (TID) | SUBCUTANEOUS | Status: DC

## 2019-11-03 MED ORDER — INSULIN ASPART 100 UNIT/ML ~~LOC~~ SOLN
0.0000 [IU] | Freq: Three times a day (TID) | SUBCUTANEOUS | Status: DC
  Administered 2019-11-03: 11 [IU] via SUBCUTANEOUS
  Administered 2019-11-03: 15 [IU] via SUBCUTANEOUS
  Administered 2019-11-03: 8 [IU] via SUBCUTANEOUS
  Administered 2019-11-04: 3 [IU] via SUBCUTANEOUS

## 2019-11-03 MED ORDER — LIVING WELL WITH DIABETES BOOK
Freq: Once | Status: AC
  Filled 2019-11-03: qty 1

## 2019-11-03 NOTE — Progress Notes (Signed)
   Subjective: 1 Day Post-Op Procedure(s) (LRB): TOTAL HIP ARTHROPLASTY ANTERIOR APPROACH (Left) Patient reports pain as mild.   Patient seen in rounds for Dr. Lequita Halt. Patient is well, and has had no acute complaints or problems other than discomfort in the left hip. Pain well controlled with medications currently. No issues overnight. Denies chest pain or SOB. Foley catheter to be discontinued this AM. We will begin therapy today.   Objective: Vital signs in last 24 hours: Temp:  [97.5 F (36.4 C)-99.6 F (37.6 C)] 97.7 F (36.5 C) (05/13 0553) Pulse Rate:  [75-94] 77 (05/13 0553) Resp:  [12-20] 14 (05/13 0553) BP: (138-190)/(75-100) 165/96 (05/13 0553) SpO2:  [98 %-100 %] 99 % (05/13 0553) Weight:  [68 kg] 68 kg (05/12 1220)  Intake/Output from previous day:  Intake/Output Summary (Last 24 hours) at 11/03/2019 0806 Last data filed at 11/03/2019 0600 Gross per 24 hour  Intake 3854.28 ml  Output 2950 ml  Net 904.28 ml     Intake/Output this shift: No intake/output data recorded.  Labs: Recent Labs    11/03/19 0254  HGB 9.5*   Recent Labs    11/03/19 0254  WBC 7.2  RBC 3.47*  HCT 29.1*  PLT 141*   Recent Labs    11/03/19 0254  NA 133*  K 4.2  CL 98  CO2 23  BUN 19  CREATININE 0.80  GLUCOSE 441*  CALCIUM 8.6*   No results for input(s): LABPT, INR in the last 72 hours.  Exam: General - Patient is Alert and Oriented Extremity - Neurologically intact Neurovascular intact Sensation intact distally Dorsiflexion/Plantar flexion intact Dressing - dressing C/D/I Motor Function - intact, moving foot and toes well on exam.   Past Medical History:  Diagnosis Date  . Arthritis    rhumatoid and osteo  . Cirrhosis of liver (HCC)   . Diabetes (HCC)   . Hypertension   . Small bowel obstruction (HCC)   . Thyroid disease     Assessment/Plan: 1 Day Post-Op Procedure(s) (LRB): TOTAL HIP ARTHROPLASTY ANTERIOR APPROACH (Left) Principal Problem:   OA  (osteoarthritis) of hip Active Problems:   Primary osteoarthritis of left hip  Estimated body mass index is 28.33 kg/m as calculated from the following:   Height as of this encounter: 5\' 1"  (1.549 m).   Weight as of this encounter: 68 kg. Advance diet Up with therapy  DVT Prophylaxis - Aspirin Weight bearing as tolerated. Begin therapy.  Glucose elevated in the 400s this AM on CMP, moderate sensitivity insulin sliding scale ordered. Pt is a known diabetic, diet controlled.   Plan is to go Home after hospital stay. Due to chronic opioid dependence and patient with limited resources (staying with a friend), we will keep her an additional day to improve mobility and stability prior to discharge.  Hopeful for discharge tomorrow pending progress.   , PA-C Orthopedic Surgery [phone] 11/03/2019, 8:06 AM

## 2019-11-03 NOTE — Evaluation (Signed)
Physical Therapy Evaluation Patient Details Name: Taylor Benitez MRN: [id number] DOB: 1960/08/26 Today's Date: 11/03/2019   History of Present Illness  Pt is a 59 year old female s/p L THA with history of opiate dependence/abuse, bil TKAs  Clinical Impression  Pt is s/p THA resulting in the deficits listed below (see PT Problem List).  Pt will benefit from skilled PT to increase their independence and safety with mobility to allow discharge to the venue listed below.  Pt assisted with ambulating in hallway.  Pt would like to practice a few steps prior to d/c for friends' homes and community.  Plan is for home with HEP.     Follow Up Recommendations Follow surgeon's recommendation for DC plan and follow-up therapies(plan for HEP)    Equipment Recommendations  None recommended by PT    Recommendations for Other Services       Precautions / Restrictions Precautions Precautions: None Restrictions LLE Weight Bearing: Weight bearing as tolerated      Mobility  Bed Mobility Overal bed mobility: Needs Assistance Bed Mobility: Supine to Sit     Supine to sit: Supervision;HOB elevated        Transfers Overall transfer level: Needs assistance Equipment used: Rolling walker (2 wheeled) Transfers: Sit to/from Stand Sit to Stand: Min guard         General transfer comment: verbal cues for UE and LE positioning  Ambulation/Gait Ambulation/Gait assistance: Min guard Gait Distance (Feet): 140 Feet Assistive device: Rolling walker (2 wheeled) Gait Pattern/deviations: Step-to pattern;Antalgic;Decreased stance time - left     General Gait Details: verbal cues for sequence, RW positioning, step length  Stairs            Wheelchair Mobility    Modified Rankin (Stroke Patients Only)       Balance                                             Pertinent Vitals/Pain Pain Assessment: 0-10 Pain Score: 5  Pain Location: left hip Pain Descriptors /  Indicators: Sore;Tightness Pain Intervention(s): Limited activity within patient's tolerance;Monitored during session;Repositioned;Premedicated before session    Home Living Family/patient expects to be discharged to:: Private residence Living Arrangements: Other relatives Available Help at Discharge: Friend(s);Available PRN/intermittently   Home Access: Stairs to enter   Entrance Stairs-Number of Steps: 2-3 Home Layout: One level Home Equipment: Walker - 2 wheels;Cane - single point      Prior Function Level of Independence: Independent with assistive device(s)         Comments: using RW prior to surgery due to pain     Hand Dominance        Extremity/Trunk Assessment        Lower Extremity Assessment Lower Extremity Assessment: LLE deficits/detail LLE Deficits / Details: able to perform ankle pumps, anticipated post op hip weakness observed LLE Sensation: history of peripheral neuropathy       Communication   Communication: No difficulties  Cognition Arousal/Alertness: Awake/alert Behavior During Therapy: WFL for tasks assessed/performed Overall Cognitive Status: Within Functional Limits for tasks assessed                                        General Comments      Exercises  Assessment/Plan    PT Assessment Patient needs continued PT services  PT Problem List Decreased strength;Decreased mobility;Decreased balance;Decreased knowledge of use of DME;Pain       PT Treatment Interventions Gait training;Therapeutic exercise;Stair training;Functional mobility training;Patient/family education;DME instruction;Therapeutic activities    PT Goals (Current goals can be found in the Care Plan section)  Acute Rehab PT Goals PT Goal Formulation: With patient Time For Goal Achievement: 11/07/19 Potential to Achieve Goals: Good    Frequency 7X/week   Barriers to discharge        Co-evaluation               AM-PAC PT "6 Clicks"  Mobility  Outcome Measure Help needed turning from your back to your side while in a flat bed without using bedrails?: A Little Help needed moving from lying on your back to sitting on the side of a flat bed without using bedrails?: A Little Help needed moving to and from a bed to a chair (including a wheelchair)?: A Little Help needed standing up from a chair using your arms (e.g., wheelchair or bedside chair)?: A Little Help needed to walk in hospital room?: A Little Help needed climbing 3-5 steps with a railing? : A Little 6 Click Score: 18    End of Session Equipment Utilized During Treatment: Gait belt Activity Tolerance: Patient tolerated treatment well Patient left: in chair;with call bell/phone within reach;with chair alarm set Nurse Communication: Mobility status PT Visit Diagnosis: Other abnormalities of gait and mobility (R26.89)    Time: 9150-5697 PT Time Calculation (min) (ACUTE ONLY): 20 min   Charges:   PT Evaluation $PT Eval Low Complexity: 1 Low        Kati PT, DPT Acute Rehabilitation Services Office: [phone]  Trena Platt 11/03/2019, 12:20 PM

## 2019-11-03 NOTE — Progress Notes (Signed)
Physical Therapy Treatment Patient Details Name: Taylor Benitez MRN: [id number] DOB: 09/12/1960 Today's Date: 11/03/2019    History of Present Illness Pt is a 59 year old female s/p L THA with history of opiate dependence/abuse, bil TKAs    PT Comments    Pt ambulated in hallway and performed LE exercises.  Pt plans to d/c home tomorrow.   Follow Up Recommendations  Follow surgeon's recommendation for DC plan and follow-up therapies     Equipment Recommendations  None recommended by PT    Recommendations for Other Services       Precautions / Restrictions Precautions Precautions: None Restrictions LLE Weight Bearing: Weight bearing as tolerated    Mobility  Bed Mobility Overal bed mobility: Needs Assistance Bed Mobility: Supine to Sit;Sit to Supine     Supine to sit: Supervision;HOB elevated Sit to supine: Supervision;HOB elevated   General bed mobility comments: increased time and effort  Transfers Overall transfer level: Needs assistance Equipment used: Rolling walker (2 wheeled) Transfers: Sit to/from Stand Sit to Stand: Min guard         General transfer comment: verbal cues for UE and LE positioning  Ambulation/Gait Ambulation/Gait assistance: Min guard Gait Distance (Feet): 200 Feet Assistive device: Rolling walker (2 wheeled) Gait Pattern/deviations: Step-to pattern;Antalgic;Decreased stance time - left     General Gait Details: verbal cues for sequence, RW positioning, step length, heel strike   Stairs             Wheelchair Mobility    Modified Rankin (Stroke Patients Only)       Balance                                            Cognition Arousal/Alertness: Awake/alert Behavior During Therapy: WFL for tasks assessed/performed Overall Cognitive Status: Within Functional Limits for tasks assessed                                        Exercises Total Joint Exercises Ankle Circles/Pumps:  AROM;10 reps;Both Quad Sets: AROM;Both;10 reps Heel Slides: AAROM;Left;10 reps Hip ABduction/ADduction: AAROM;Left;10 reps Long Arc Quad: AROM;Left;Seated;10 reps Knee Flexion: AROM;Standing;Left;10 reps Marching in Standing: AROM;Left;Standing;10 reps(bil UE support for all standing exercises) Standing Hip Extension: AROM;Left;10 reps;Standing    General Comments        Pertinent Vitals/Pain Pain Assessment: 0-10 Pain Score: 4  Pain Location: left hip Pain Descriptors / Indicators: Sore;Tightness Pain Intervention(s): Repositioned;Monitored during session    Home Living                      Prior Function            PT Goals (current goals can now be found in the care plan section) Progress towards PT goals: Progressing toward goals    Frequency    7X/week      PT Plan Current plan remains appropriate    Co-evaluation              AM-PAC PT "6 Clicks" Mobility   Outcome Measure  Help needed turning from your back to your side while in a flat bed without using bedrails?: A Little Help needed moving from lying on your back to sitting on the side of a flat bed without using bedrails?: A  Little Help needed moving to and from a bed to a chair (including a wheelchair)?: A Little Help needed standing up from a chair using your arms (e.g., wheelchair or bedside chair)?: A Little Help needed to walk in hospital room?: A Little Help needed climbing 3-5 steps with a railing? : A Little 6 Click Score: 18    End of Session Equipment Utilized During Treatment: Gait belt Activity Tolerance: Patient tolerated treatment well Patient left: with call bell/phone within reach;in bed Nurse Communication: Mobility status PT Visit Diagnosis: Other abnormalities of gait and mobility (R26.89)     Time: 4128-7867 PT Time Calculation (min) (ACUTE ONLY): 26 min  Charges:  $Gait Training: 8-22 mins $Therapeutic Exercise: 8-22 mins                     Arlyce Dice,  DPT Acute Rehabilitation Services Office: [place] [facility] 11/03/2019, 2:42 PM

## 2019-11-03 NOTE — TOC Progression Note (Signed)
Transition of Care [facility]) - Progression Note    Patient Details  Name: Taylor Benitez MRN: [id number] Date of Birth: Nov 04, 1960  [facility] CM/SW Contact  Clearance Coots, LCSW Phone Number: 11/03/2019, 9:55 AM  Clinical Narrative:    Therapy Plan: HEP Patient confirm she has a RW and 3 IN 1.      Barriers to Discharge: Continued Medical Work up  Expected Discharge Plan and Services                           DME Arranged: N/A DME Agency: NA       HH Arranged: NA HH Agency: NA         Social Determinants of Health (SDOH) Interventions    Readmission Risk Interventions No flowsheet data found.

## 2019-11-03 NOTE — Progress Notes (Signed)
Results for AMADI, YOSHINO (MRN [id number]) as of 11/03/2019 09:17  Ref. Range 11/02/2019 12:27 11/02/2019 16:13 11/03/2019 08:08  Glucose-Capillary Latest Ref Range: 70 - 99 mg/dL 383 (H) 779 (H) 396 (H)  Noted that patient has diagnosis of diabetes in history. Patient's latest HgbA1C is 7.6%. No listing of home diabetes medications.   Recommend following up with PCP for home glucose control. Noted that CBGs have been elevated since surgery yesterday when patient received Decadron 10 mg at 1420.  Current insulin orders: Novolog MODERATE correction scale TID.   Will continue to monitor blood sugars while in the hospital.  Smith Mince RN BSN CDE Diabetes Coordinator Pager: [phone]  8am-5pm

## 2019-11-03 NOTE — Progress Notes (Signed)
Taylor Benitez notified of blood sugar readings today.

## 2019-11-04 LAB — CBC
HCT: 28.8 % — ABNORMAL LOW (ref 36.0–46.0)
Hemoglobin: 9.2 g/dL — ABNORMAL LOW (ref 12.0–15.0)
MCH: 27.1 pg (ref 26.0–34.0)
MCHC: 31.9 g/dL (ref 30.0–36.0)
MCV: 85 fL (ref 80.0–100.0)
Platelets: 141 10*3/uL — ABNORMAL LOW (ref 150–400)
RBC: 3.39 MIL/uL — ABNORMAL LOW (ref 3.87–5.11)
RDW: 15.2 % (ref 11.5–15.5)
WBC: 7.9 10*3/uL (ref 4.0–10.5)
nRBC: 0 % (ref 0.0–0.2)

## 2019-11-04 LAB — GLUCOSE, CAPILLARY: Glucose-Capillary: 154 mg/dL — ABNORMAL HIGH (ref 70–99)

## 2019-11-04 LAB — BASIC METABOLIC PANEL
Anion gap: 9 (ref 5–15)
BUN: 26 mg/dL — ABNORMAL HIGH (ref 6–20)
CO2: 25 mmol/L (ref 22–32)
Calcium: 8.9 mg/dL (ref 8.9–10.3)
Chloride: 103 mmol/L (ref 98–111)
Creatinine, Ser: 0.81 mg/dL (ref 0.44–1.00)
GFR calc Af Amer: >60 mL/min (ref >60)
GFR calc non Af Amer: >60 mL/min (ref >60)
Glucose, Bld: 185 mg/dL — ABNORMAL HIGH (ref 70–99)
Potassium: 3.6 mmol/L (ref 3.5–5.1)
Sodium: 137 mmol/L (ref 135–145)

## 2019-11-04 MED ORDER — METHOCARBAMOL 500 MG PO TABS: 500.0000 mg | ORAL_TABLET | Freq: Four times a day (QID) | ORAL | 0 refills | Status: DC | PRN

## 2019-11-04 MED ORDER — OXYCODONE HCL 5 MG PO TABS: 5.0000 mg | ORAL_TABLET | Freq: Four times a day (QID) | ORAL | 0 refills | Status: DC | PRN

## 2019-11-04 MED ORDER — ASPIRIN 325 MG PO TBEC: 325.0000 mg | DELAYED_RELEASE_TABLET | Freq: Two times a day (BID) | ORAL | 0 refills | Status: AC

## 2019-11-04 NOTE — Progress Notes (Signed)
Pt provided with d/c instructions. After discussing the pt's plan of care upon d/c home, the pt denied any further questions or concerns.  

## 2019-11-04 NOTE — Progress Notes (Signed)
   Subjective: 2 Days Post-Op Procedure(s) (LRB): TOTAL HIP ARTHROPLASTY ANTERIOR APPROACH (Left) Patient reports pain as mild.   Patient seen in rounds for Dr. Lequita Halt. Patient is well, and has had no acute complaints or problems. Pain is well controlled on pre-operative pain medication regimen of 10 mg oxycodone. Will not send home with additional narcotic. Did extremely well with physical therapy yesterday, states she is ready for discharge. Positive flatus, voiding without difficulty. Denies chest pain, SOB, or calf pain. Plan is to go Home after hospital stay.  Objective: Vital signs in last 24 hours: Temp:  [97.9 F (36.6 C)-98.2 F (36.8 C)] 97.9 F (36.6 C) (05/14 0636) Pulse Rate:  [75-84] 75 (05/14 0636) Resp:  [16-17] 16 (05/14 0636) BP: (129-142)/(59-76) 142/76 (05/14 0636) SpO2:  [98 %-100 %] 100 % (05/14 0636)  Intake/Output from previous day:  Intake/Output Summary (Last 24 hours) at 11/04/2019 0719 Last data filed at 11/04/2019 0636 Gross per 24 hour  Intake 1437.05 ml  Output 300 ml  Net 1137.05 ml    Intake/Output this shift: No intake/output data recorded.  Labs: Recent Labs    11/03/19 0254 11/04/19 0308  HGB 9.5* 9.2*   Recent Labs    11/03/19 0254 11/04/19 0308  WBC 7.2 7.9  RBC 3.47* 3.39*  HCT 29.1* 28.8*  PLT 141* 141*   Recent Labs    11/03/19 0254 11/04/19 0308  NA 133* 137  K 4.2 3.6  CL 98 103  CO2 23 25  BUN 19 26*  CREATININE 0.80 0.81  GLUCOSE 441* 185*  CALCIUM 8.6* 8.9   No results for input(s): LABPT, INR in the last 72 hours.  Exam: General - Patient is Alert and Oriented Extremity - Neurologically intact Neurovascular intact Sensation intact distally Dorsiflexion/Plantar flexion intact Dressing/Incision - clean, dry, no drainage. Aquacell dressing in place. Motor Function - intact, moving foot and toes well on exam.   Past Medical History:  Diagnosis Date  . Arthritis    rhumatoid and osteo  . Cirrhosis of  liver (HCC)   . Diabetes (HCC)   . Hypertension   . Small bowel obstruction (HCC)   . Thyroid disease     Assessment/Plan: 2 Days Post-Op Procedure(s) (LRB): TOTAL HIP ARTHROPLASTY ANTERIOR APPROACH (Left) Principal Problem:   OA (osteoarthritis) of hip Active Problems:   Primary osteoarthritis of left hip  Estimated body mass index is 28.33 kg/m as calculated from the following:   Height as of this encounter: 5\' 1"  (1.549 m).   Weight as of this encounter: 68 kg. Up with therapy D/C IV fluids  DVT Prophylaxis - Aspirin Weight-bearing as tolerated  Plan for discharge to home with friend after one session of PT this AM. HEP Follow-up on May 25th.  May 27, PA-C Orthopedic Surgery [phone] 11/04/2019, 7:19 AM

## 2019-11-04 NOTE — Progress Notes (Signed)
Physical Therapy Treatment Patient Details Name: Taylor Benitez MRN: [id number] DOB: [birth year] Today's Date: 11/04/2019    History of Present Illness Pt is a 59 year old female s/p L THA with history of opiate dependence/abuse, bil TKAs    PT Comments    Pt ambulated in hallway and practiced safe stair technique.  Pt declined performing exercises however reviewed HEP handout, and pt had no further questions.  Pt feels ready to d/c home today.    Follow Up Recommendations  Follow surgeon's recommendation for DC plan and follow-up therapies     Equipment Recommendations  None recommended by PT    Recommendations for Other Services       Precautions / Restrictions Precautions Precautions: None Restrictions LLE Weight Bearing: Weight bearing as tolerated    Mobility  Bed Mobility Overal bed mobility: Needs Assistance Bed Mobility: Supine to Sit     Supine to sit: Supervision;HOB elevated     General bed mobility comments: increased time and effort  Transfers Overall transfer level: Needs assistance Equipment used: Rolling walker (2 wheeled) Transfers: Sit to/from Stand Sit to Stand: Supervision         General transfer comment: verbal cues for UE and LE positioning  Ambulation/Gait Ambulation/Gait assistance: Min guard;Supervision Gait Distance (Feet): 200 Feet Assistive device: Rolling walker (2 wheeled) Gait Pattern/deviations: Step-to pattern;Antalgic;Decreased stance time - left     General Gait Details: verbal cues for sequence, RW positioning, step length, heel strike   Stairs Stairs: Yes Stairs assistance: Min guard Stair Management: Backwards;With walker;Step to pattern Number of Stairs: 2 General stair comments: verbal cues for sequence, RW positioning, safety; pt reports understanding   Wheelchair Mobility    Modified Rankin (Stroke Patients Only)       Balance                                             Cognition Arousal/Alertness: Awake/alert Behavior During Therapy: WFL for tasks assessed/performed Overall Cognitive Status: Within Functional Limits for tasks assessed                                        Exercises      General Comments        Pertinent Vitals/Pain Pain Assessment: 0-10 Pain Score: 5  Pain Location: left hip Pain Descriptors / Indicators: Sore;Tightness Pain Intervention(s): Monitored during session;Repositioned    Home Living                      Prior Function            PT Goals (current goals can now be found in the care plan section) Progress towards PT goals: Progressing toward goals    Frequency    7X/week      PT Plan Current plan remains appropriate    Co-evaluation              AM-PAC PT "6 Clicks" Mobility   Outcome Measure  Help needed turning from your back to your side while in a flat bed without using bedrails?: A Little Help needed moving from lying on your back to sitting on the side of a flat bed without using bedrails?: A Little Help needed moving to and from a bed to a  chair (including a wheelchair)?: A Little Help needed standing up from a chair using your arms (e.g., wheelchair or bedside chair)?: A Little Help needed to walk in hospital room?: A Little Help needed climbing 3-5 steps with a railing? : A Little 6 Click Score: 18    End of Session Equipment Utilized During Treatment: Gait belt Activity Tolerance: Patient tolerated treatment well Patient left: with call bell/phone within reach;in chair Nurse Communication: Mobility status PT Visit Diagnosis: Other abnormalities of gait and mobility (R26.89)     Time: 5844-6520 PT Time Calculation (min) (ACUTE ONLY): 18 min  Charges:  $Gait Training: 8-22 mins                     Paulino Door, DPT Acute Rehabilitation Services Office: [phone]  [name] 11/04/2019, 4:37 PM

## 2019-11-04 NOTE — Progress Notes (Signed)
Spoke with patient about her diabetes. Stated that she had prediabetes for a long time, but had started taking Metformin and Glipizide in 2018. Stated that these oral agents made her "sick" and she stopped taking them. She has talked with her PCP about her diabetes and will be following up with them after this admission.   Reviewed her HgbA1c of 7.6% which she explains that her eating has not been the best since COVID and having moved in with her mother and brother. Discussed her diet habits, trying to get exercise when she is feeling stronger and better after the hip surgery. Encouraged her to check blood sugars 1-2 times per day at home and record results so that her PCP can see it.   She seems to know what needs to be done and can follow a healthy diet. Ordered Living Well with Diabetes booklet from pharmacy for her to take home with her.   Smith Mince RN BSN CDE Diabetes Coordinator Pager: [phone]  8am-5pm

## 2019-11-07 NOTE — Telephone Encounter (Signed)
Message left for patient to return my call.  

## 2019-11-09 NOTE — Discharge Summary (Signed)
Physician Discharge Summary   Patient ID: Taylor Benitez MRN: [id number] DOB/AGE: 1961-02-27 59 y.o.  Admit date: 11/02/2019 Discharge date: 11/04/2019  Primary Diagnosis: Osteoarthritis, left hip   Admission Diagnoses:  Past Medical History:  Diagnosis Date  . Arthritis    rhumatoid and osteo  . Cirrhosis of liver (HCC)   . Diabetes (HCC)   . Hypertension   . Small bowel obstruction (HCC)   . Thyroid disease    Discharge Diagnoses:   Principal Problem:   OA (osteoarthritis) of hip Active Problems:   Primary osteoarthritis of left hip  Estimated body mass index is 28.33 kg/m as calculated from the following:   Height as of this encounter: 5\' 1"  (1.549 m).   Weight as of this encounter: 68 kg.  Procedure:  Procedure(s) (LRB): TOTAL HIP ARTHROPLASTY ANTERIOR APPROACH (Left)   Consults: None  HPI: Taylor Benitez is a 59 y.o. female who has advanced end-stage arthritis of their Left  hip with progressively worsening pain and dysfunction.The patient has failed nonoperative management and presents for total hip arthroplasty.   Laboratory Data: Admission on 11/02/2019, Discharged on 11/04/2019  Component Date Value Ref Range Status  . Glucose-Capillary 11/02/2019 195* 70 - 99 mg/dL Final   Glucose reference range applies only to samples taken after fasting for at least 8 hours.  . Glucose-Capillary 11/02/2019 181* 70 - 99 mg/dL Final   Glucose reference range applies only to samples taken after fasting for at least 8 hours.  . WBC 11/03/2019 7.2  4.0 - 10.5 K/uL Final  . RBC 11/03/2019 3.47* 3.87 - 5.11 MIL/uL Final  . Hemoglobin 11/03/2019 9.5* 12.0 - 15.0 g/dL Final  . HCT 11/05/2019 29.1* 36.0 - 46.0 % Final  . MCV 11/03/2019 83.9  80.0 - 100.0 fL Final  . MCH 11/03/2019 27.4  26.0 - 34.0 pg Final  . MCHC 11/03/2019 32.6  30.0 - 36.0 g/dL Final  . RDW 11/05/2019 14.8  11.5 - 15.5 % Final  . Platelets 11/03/2019 141* 150 - 400 K/uL Final  . nRBC 11/03/2019 0.0  0.0 -  0.2 % Final   Performed at [facility], [address] [place], [place]  . Sodium 11/03/2019 133* 135 - 145 mmol/L Final  . Potassium 11/03/2019 4.2  3.5 - 5.1 mmol/L Final  . Chloride 11/03/2019 98  98 - 111 mmol/L Final  . CO2 11/03/2019 23  22 - 32 mmol/L Final  . Glucose, Bld 11/03/2019 441* 70 - 99 mg/dL Final   Glucose reference range applies only to samples taken after fasting for at least 8 hours.  . BUN 11/03/2019 19  6 - 20 mg/dL Final  . Creatinine, Ser 11/03/2019 0.80  0.44 - 1.00 mg/dL Final  . Calcium 11/05/2019 8.6* 8.9 - 10.3 mg/dL Final  . GFR calc non Af Amer 11/03/2019 >60  >60 mL/min Final  . GFR calc Af Amer 11/03/2019 >60  >60 mL/min Final  . Anion gap 11/03/2019 12  5 - 15 Final   Performed at [facility], [address], [place], [place]  . Glucose-Capillary 11/03/2019 305* 70 - 99 mg/dL Final   Glucose reference range applies only to samples taken after fasting for at least 8 hours.  . Glucose-Capillary 11/03/2019 295* 70 - 99 mg/dL Final   Glucose reference range applies only to samples taken after fasting for at least 8 hours.  . Glucose-Capillary 11/03/2019 392* 70 - 99 mg/dL Final   Glucose  reference range applies only to samples taken after fasting for at least 8 hours.  . WBC 11/04/2019 7.9  4.0 - 10.5 K/uL Final  . RBC 11/04/2019 3.39* 3.87 - 5.11 MIL/uL Final  . Hemoglobin 11/04/2019 9.2* 12.0 - 15.0 g/dL Final  . HCT 85/07/7739 28.8* 36.0 - 46.0 % Final  . MCV 11/04/2019 85.0  80.0 - 100.0 fL Final  . MCH 11/04/2019 27.1  26.0 - 34.0 pg Final  . MCHC 11/04/2019 31.9  30.0 - 36.0 g/dL Final  . RDW 28/78/6767 15.2  11.5 - 15.5 % Final  . Platelets 11/04/2019 141* 150 - 400 K/uL Final  . nRBC 11/04/2019 0.0  0.0 - 0.2 % Final   Performed at [facility], [address], [place], [place] [address]  . Sodium 11/04/2019 137  135 - 145 mmol/L Final  . Potassium 11/04/2019 3.6   3.5 - 5.1 mmol/L Final  . Chloride 11/04/2019 103  98 - 111 mmol/L Final  . CO2 11/04/2019 25  22 - 32 mmol/L Final  . Glucose, Bld 11/04/2019 185* 70 - 99 mg/dL Final   Glucose reference range applies only to samples taken after fasting for at least 8 hours.  . BUN 11/04/2019 26* 6 - 20 mg/dL Final  . Creatinine, Ser 11/04/2019 0.81  0.44 - 1.00 mg/dL Final  . Calcium 09/62/8366 8.9  8.9 - 10.3 mg/dL Final  . GFR calc non Af Amer 11/04/2019 >60  >60 mL/min Final  . GFR calc Af Amer 11/04/2019 >60  >60 mL/min Final  . Anion gap 11/04/2019 9  5 - 15 Final   Performed at [facility], [address] [place], [place] [address]  . Glucose-Capillary 11/03/2019 282* 70 - 99 mg/dL Final   Glucose reference range applies only to samples taken after fasting for at least 8 hours.  . Glucose-Capillary 11/04/2019 154* 70 - 99 mg/dL Final   Glucose reference range applies only to samples taken after fasting for at least 8 hours.  Hospital Outpatient Visit on 10/29/2019  Component Date Value Ref Range Status  . SARS Coronavirus 2 10/29/2019 NEGATIVE  NEGATIVE Final   Comment: (NOTE) SARS-CoV-2 target nucleic acids are NOT DETECTED. The SARS-CoV-2 RNA is generally detectable in upper and lower respiratory specimens during the acute phase of infection. Negative results do not preclude SARS-CoV-2 infection, do not rule out co-infections with other pathogens, and should not be used as the sole basis for treatment or other patient management decisions. Negative results must be combined with clinical observations, patient history, and epidemiological information. The expected result is Negative. Fact Sheet for Patients: HairSlick.no Fact Sheet for Healthcare Providers: quierodirigir.com This test is not yet approved or cleared by the [place] FDA and  has been authorized for detection and/or diagnosis of SARS-CoV-2 by FDA  under an Emergency Use Authorization (EUA). This EUA will remain  in effect (meaning this test can be used) for the duration of the COVID-19 declaration under Section 56                          4(b)(1) of the Act, 21 U.S.C. section 360bbb-3(b)(1), unless the authorization is terminated or revoked sooner. Performed at [facility], [address], [place], [place] [address]   Hospital Outpatient Visit on 10/26/2019  Component Date Value Ref Range Status  . MRSA, PCR 10/26/2019 NEGATIVE  NEGATIVE Final  . Staphylococcus aureus 10/26/2019 POSITIVE* NEGATIVE Final  Comment: (NOTE) The Xpert SA Assay (FDA approved for NASAL specimens in patients 27 years of age and older), is one component of a comprehensive surveillance program. It is not intended to diagnose infection nor to guide or monitor treatment. Performed at [facility], [address] [place], [place] [address]   . aPTT 10/26/2019 30  24 - 36 seconds Final   Performed at [facility], [address], [place], [place] [address]  . WBC 10/26/2019 5.4  4.0 - 10.5 K/uL Final  . RBC 10/26/2019 3.91  3.87 - 5.11 MIL/uL Final  . Hemoglobin 10/26/2019 10.7* 12.0 - 15.0 g/dL Final  . HCT 30/86/5784 34.4* 36.0 - 46.0 % Final  . MCV 10/26/2019 88.0  80.0 - 100.0 fL Final  . MCH 10/26/2019 27.4  26.0 - 34.0 pg Final  . MCHC 10/26/2019 31.1  30.0 - 36.0 g/dL Final  . RDW 69/62/9528 15.9* 11.5 - 15.5 % Final  . Platelets 10/26/2019 160  150 - 400 K/uL Final  . nRBC 10/26/2019 0.0  0.0 - 0.2 % Final   Performed at [facility], [address] [place], [place] [address]  . Sodium 10/26/2019 140  135 - 145 mmol/L Final  . Potassium 10/26/2019 3.9  3.5 - 5.1 mmol/L Final  . Chloride 10/26/2019 106  98 - 111 mmol/L Final  . CO2 10/26/2019 24  22 - 32 mmol/L Final  . Glucose, Bld 10/26/2019 218* 70 - 99 mg/dL Final   Glucose reference range applies only to samples taken after  fasting for at least 8 hours.  . BUN 10/26/2019 15  6 - 20 mg/dL Final  . Creatinine, Ser 10/26/2019 0.80  0.44 - 1.00 mg/dL Final  . Calcium 40/03/2724 9.0  8.9 - 10.3 mg/dL Final  . Total Protein 10/26/2019 6.9  6.5 - 8.1 g/dL Final  . Albumin 36/64/4034 4.1  3.5 - 5.0 g/dL Final  . AST 74/25/9563 44* 15 - 41 U/L Final  . ALT 10/26/2019 35  0 - 44 U/L Final  . Alkaline Phosphatase 10/26/2019 87  38 - 126 U/L Final  . Total Bilirubin 10/26/2019 0.3  0.3 - 1.2 mg/dL Final  . GFR calc non Af Amer 10/26/2019 >60  >60 mL/min Final  . GFR calc Af Amer 10/26/2019 >60  >60 mL/min Final  . Anion gap 10/26/2019 10  5 - 15 Final   Performed at [facility], [address] [place], [place] [address]  . Prothrombin Time 10/26/2019 13.5  11.4 - 15.2 seconds Final  . INR 10/26/2019 1.1  0.8 - 1.2 Final   Comment: (NOTE) INR goal varies based on device and disease states. Performed at [facility], [address] [place], [place] [address]   . ABO/RH(D) 10/26/2019 A POS   Final  . Antibody Screen 10/26/2019 NEG   Final  . Sample Expiration 10/26/2019 11/05/2019,2359   Final  . Extend sample reason 10/26/2019    Final                   Value:NO TRANSFUSIONS OR PREGNANCY IN THE PAST 3 MONTHS Performed at [facility], [address] [place], [place] [address]   . Hgb A1c MFr Bld 10/26/2019 7.6* 4.8 - 5.6 % Final   Comment: (NOTE) Pre diabetes:          5.7%-6.4% Diabetes:              >6.4% Glycemic  control for   <7.0% adults with diabetes   . Mean Plasma Glucose 10/26/2019 171.42  mg/dL Final   Performed at [facility], [address], [place], [place] [address]  . ABO/RH(D) 10/26/2019    Final                   Value:A POS Performed at [facility], [address] [place], [place] [address]      X-Rays:DG Pelvis Portable  Result Date: 11/02/2019 CLINICAL DATA:  Post LEFT hip surgery today EXAM:  PORTABLE PELVIS 1-2 VIEWS COMPARISON:  Portable exam 1559 hours compared to intraoperative images of 11/02/2019 FINDINGS: BILATERAL hip prostheses identified. Bones appear demineralized. No fracture, dislocation, or bone destruction. IMPRESSION: LEFT hip prosthesis without acute complication. Prior RIGHT hip arthroplasty. Electronically Signed   By: Ulyses Southward M.D.   On: 11/02/2019 16:21   DG C-Arm 1-60 Min-No Report  Result Date: 11/02/2019 Fluoroscopy was utilized by the requesting physician.  No radiographic interpretation.   DG HIP OPERATIVE UNILAT W OR W/O PELVIS LEFT  Result Date: 11/02/2019 CLINICAL DATA:  Intraoperative left hip replacement EXAM: OPERATIVE LEFT HIP WITH PELVIS; DG C-ARM 1-60 MIN-NO REPORT COMPARISON:  CT abdomen pelvis January 24, 2005. FINDINGS: Fluoro time: 6 seconds Operative radiographs depict interval placement of a total left hip arthroplasty without acute complication. Expected postsurgical soft tissue changes including soft tissue and intra-articular gas. A right hip arthroplasty is in expected alignment as well. Remaining portions of the bony pelvis are unremarkable. IMPRESSION: Intraoperative fluoroscopic images during left hip arthroplasty without acute complication. Electronically Signed   By: Kreg Shropshire M.D.   On: 11/02/2019 17:32    EKG: Orders placed or performed during the hospital encounter of 10/26/19  . EKG 12 lead  . EKG 12 lead     Hospital Course: Taylor Benitez is a 59 y.o. who was admitted to [facility]. They were brought to the operating room on 11/02/2019 and underwent Procedure(s): TOTAL HIP ARTHROPLASTY ANTERIOR APPROACH.  Patient tolerated the procedure well and was later transferred to the recovery room and then to the orthopaedic floor for postoperative care. They were given PO and IV analgesics for pain control following their surgery. They were given 24 hours of postoperative antibiotics of  Anti-infectives (From admission,  onward)   Start     Dose/Rate Route Frequency Ordered Stop   11/02/19 2030  ceFAZolin (ANCEF) IVPB 2g/100 mL premix     2 g 200 mL/hr over 30 Minutes Intravenous Every 6 hours 11/02/19 1647 11/03/19 0355   11/02/19 1230  ceFAZolin (ANCEF) IVPB 2g/100 mL premix     2 g 200 mL/hr over 30 Minutes Intravenous On call to O.R. 11/02/19 1214 11/02/19 1426     and started on DVT prophylaxis in the form of Aspirin.   PT and OT were ordered for total joint protocol. Discharge planning consulted to help with postop disposition and equipment needs. Patient had a decent night on the evening of surgery. They started to get up OOB with therapy on POD #1. Continued to work with therapy into POD #2. Pt was seen during rounds on day two and was ready to go home pending progress with therapy. Aquacell was in place, clean and dry with no drainage. Pt worked with therapy for one additional session and was meeting their goals. She was discharged to home later that day in stable condition.  Diet: Diabetic diet Activity: WBAT Follow-up: in 2 weeks  Disposition: Home with HEP Discharged Condition: stable   Discharge Instructions    Call MD / Call 911   Complete by: As directed    If you experience chest pain or shortness of breath, CALL 911 and be transported to the hospital emergency room.  If you develope a fever above 101 F, pus (white drainage) or increased drainage or redness at the wound, or calf pain, call your surgeon's office.   Change dressing   Complete by: As directed    You have an adhesive waterproof bandage over the incision. Leave this in place until your first follow-up appointment. Once you remove this you will not need to place another bandage.   Constipation Prevention   Complete by: As directed    Drink plenty of fluids.  Prune juice may be helpful.  You may use a stool softener, such as Colace (over the counter) 100 mg twice a day.  Use MiraLax (over the counter) for constipation as needed.    Diet - low sodium heart healthy   Complete by: As directed    Do not sit on low chairs, stoools or toilet seats, as it may be difficult to get up from low surfaces   Complete by: As directed    Driving restrictions   Complete by: As directed    No driving for two weeks   TED hose   Complete by: As directed    Use stockings (TED hose) for three weeks on both leg(s).  You may remove them at night for sleeping.   Weight bearing as tolerated   Complete by: As directed      Allergies as of 11/04/2019      Reactions   Penicillins Rash   Tolerated Cephalosporin Date: 11/04/19.      Medication List    STOP taking these medications   celecoxib 100 MG capsule Commonly known as: CELEBREX   ibuprofen 200 MG tablet Commonly known as: ADVIL     TAKE these medications   albuterol 108 (90 Base) MCG/ACT inhaler Commonly known as: VENTOLIN HFA Inhale 1-2 puffs into the lungs every 6 (six) hours as needed for wheezing or shortness of breath.   amitriptyline 100 MG tablet Commonly known as: ELAVIL TAKE 2 TABLETS (200 MG TOTAL) BY MOUTH EVERY EVENING. What changed: when to take this   aspirin 325 MG EC tablet Take 1 tablet (325 mg total) by mouth 2 (two) times daily for 19 days. Then take one 81 mg aspirin once a day for three weeks. Then discontinue aspirin.   Baclofen 5 MG Tabs Take 5 mg by mouth 2 (two) times daily as needed for spasms.   hydrOXYzine 25 MG tablet Commonly known as: ATARAX/VISTARIL Take 0.5-1 tablets (12.5-25 mg total) by mouth every 8 (eight) hours as needed for anxiety.   levothyroxine 150 MCG tablet Commonly known as: SYNTHROID TAKE 1 TABLET BY MOUTH EVERY DAY What changed: when to take this   methocarbamol 500 MG tablet Commonly known as: ROBAXIN Take 1 tablet (500 mg total) by mouth every 6 (six) hours as needed for muscle spasms.   oxyCODONE 5 MG immediate release tablet Commonly known as: Oxy IR/ROXICODONE Take 1-2 tablets (5-10 mg total) by mouth  every 6 (six) hours as needed for severe pain. What changed:   medication strength  how much to take  reasons to take this   sertraline 50 MG tablet Commonly known as: ZOLOFT Take 1 tablet (50 mg total) by mouth daily.  Discharge Care Instructions  (From admission, onward)         Start     Ordered   11/04/19 0000  Weight bearing as tolerated     11/04/19 0748   11/04/19 0000  Change dressing    Comments: You have an adhesive waterproof bandage over the incision. Leave this in place until your first follow-up appointment. Once you remove this you will not need to place another bandage.   11/04/19 0748         Follow-up Information    Gaynelle Arabian, MD. Schedule an appointment as soon as possible for a visit on 11/15/2019.   Specialty: Orthopedic Surgery Contact information: [address] [place] [address] [phone]           Signed: Theresa Duty, PA-C Orthopedic Surgery 11/09/2019, 8:58 AM

## 2019-11-11 NOTE — Telephone Encounter (Signed)
LVM-return call back to the office-Anna

## 2019-11-13 ENCOUNTER — Other Ambulatory Visit: Payer: Self-pay | Admitting: Family Medicine

## 2019-11-16 ENCOUNTER — Other Ambulatory Visit: Payer: Self-pay | Admitting: Family Medicine

## 2019-11-18 NOTE — Telephone Encounter (Signed)
Message left for patient to return my call.  Letter to be mailed Tuesday if no call back

## 2019-11-23 NOTE — Telephone Encounter (Signed)
LM, letter mailed  Nothing further needed.

## 2019-12-16 ENCOUNTER — Telehealth: Payer: Self-pay | Admitting: Family Medicine

## 2019-12-16 NOTE — Telephone Encounter (Signed)
Please call patient and see if she is taking any vitamin D? I received labs from April that showed a very low vitamin D level and she needs high dose weekly replacement. If she is not taking any vitamin D currently, please let me know and I will send to her pharmacy.

## 2019-12-19 ENCOUNTER — Other Ambulatory Visit: Payer: Self-pay | Admitting: Family Medicine

## 2019-12-19 DIAGNOSIS — E559 Vitamin D deficiency, unspecified: Secondary | ICD-10-CM

## 2019-12-19 MED ORDER — VITAMIN D3 1.25 MG (50000 UT) PO TABS: 1.0000 | ORAL_TABLET | ORAL | 3 refills | Status: DC

## 2019-12-19 NOTE — Telephone Encounter (Addendum)
Pt states that she does have a Multivitamin with D at home called [employer] [facility] [employer] which has per serving (2 gummies) of Vit D. She states that she has not been consistent with taking this daily but she has a large bottle at home.  Pt is okay with having a High Dose Vit D sent to her pharmacy. **Okay to take with the Current Multivitamin? [facility]  Please advise, thanks.    Lab results abstracted FYI

## 2019-12-19 NOTE — Telephone Encounter (Signed)
Please call patient and let her know that I have sent in a high dose, weekly vitamin D. It is fine to continue her multivitamin.

## 2019-12-20 NOTE — Telephone Encounter (Signed)
ATC, mailbox full. Unable to leave voicemail. Will call back

## 2019-12-22 NOTE — Telephone Encounter (Signed)
Left detailed message on verified voicemail  Nothing further needed.  

## 2020-01-27 ENCOUNTER — Other Ambulatory Visit: Payer: Self-pay | Admitting: Family Medicine

## 2020-02-17 ENCOUNTER — Telehealth: Payer: Self-pay | Admitting: Family Medicine

## 2020-02-17 DIAGNOSIS — F419 Anxiety disorder, unspecified: Secondary | ICD-10-CM

## 2020-02-17 NOTE — Telephone Encounter (Signed)
Please schedule CPE

## 2020-02-17 NOTE — Telephone Encounter (Signed)
Please schedule pt for follow up visit in 03/2020

## 2020-02-17 NOTE — Telephone Encounter (Signed)
Do you want a physical or just a follow up?

## 2020-02-22 NOTE — Telephone Encounter (Signed)
Left message asking pt to call office  °

## 2020-03-07 ENCOUNTER — Encounter: Payer: Self-pay | Admitting: Family Medicine

## 2020-03-07 NOTE — Telephone Encounter (Signed)
Left message and mailed letter

## 2020-03-12 ENCOUNTER — Other Ambulatory Visit: Payer: Self-pay | Admitting: Family Medicine

## 2020-03-12 NOTE — Telephone Encounter (Signed)
Last refill 02/17/2020 but Pharmacy is requesting refill for #180. No upcoming appt.

## 2020-04-09 ENCOUNTER — Other Ambulatory Visit: Payer: Self-pay | Admitting: Family Medicine

## 2020-04-10 NOTE — Telephone Encounter (Signed)
No future ov. Last ov [id number]. Last refill [id number], #30, 1rf

## 2020-04-11 NOTE — Telephone Encounter (Signed)
Called pt lvm to call office and schedule annual exam.  Refilled rx #30

## 2020-04-13 ENCOUNTER — Telehealth: Payer: Self-pay

## 2020-04-13 NOTE — Telephone Encounter (Signed)
Jitza with [employer] medical supplies wants to know if fax for DM supplies received that was faxed on 04/11/20 or 04/12/20. I do not see request and Philomena Doheny will refax to [phone]. nothing further needed at this time. FYI to Clydie Braun CMA.

## 2020-04-13 NOTE — Telephone Encounter (Signed)
Faxed received from [facility] medical supplies. I attached copy of pt's demographics and placed forms in [employer].

## 2020-04-16 ENCOUNTER — Telehealth: Payer: Self-pay | Admitting: Family Medicine

## 2020-04-16 NOTE — Telephone Encounter (Signed)
Please call patient and schedule her for follow up visit to check blood work. I have received a request for continuous glucose monitor. She needs a visit and blood work to complete the paperwork. I will keep it on my desk.

## 2020-04-17 NOTE — Telephone Encounter (Signed)
lvm

## 2020-04-17 NOTE — Telephone Encounter (Signed)
One visit is fine. Thanks.

## 2020-04-18 NOTE — Telephone Encounter (Signed)
lvm

## 2020-04-19 NOTE — Telephone Encounter (Addendum)
Call pt and scheduled f/u visit for 04/25/2020 12:00

## 2020-04-19 NOTE — Telephone Encounter (Deleted)
Error in documentation, computer shut down.   Pt's appt is scheduled for 04/25/2020 at 12:00

## 2020-04-24 ENCOUNTER — Telehealth: Payer: Self-pay

## 2020-04-24 NOTE — Telephone Encounter (Signed)
error 

## 2020-04-24 NOTE — Telephone Encounter (Signed)
Called pt lvm. Need to confirm with pt that she is requesting medical supplies from [facility].

## 2020-04-25 ENCOUNTER — Ambulatory Visit: Payer: Medicare Other | Admitting: Family Medicine

## 2020-04-25 NOTE — Telephone Encounter (Signed)
Patient called in to confirm she did request those supplies.

## 2020-04-25 NOTE — Telephone Encounter (Signed)
Pt confirmed request for medical supplies. Written order from [facility] placed in Debbie's inbox for signature.

## 2020-04-30 NOTE — Telephone Encounter (Signed)
Per notes pt canceled ov that was scheduled for 04/25/2020 due to lack of transportation. Called pt today to reschedule appt., lvm to call office to schedule f/u visit.

## 2020-05-05 ENCOUNTER — Other Ambulatory Visit: Payer: Self-pay | Admitting: Family Medicine

## 2020-05-26 ENCOUNTER — Other Ambulatory Visit: Payer: Self-pay | Admitting: Family Medicine

## 2020-05-28 NOTE — Telephone Encounter (Signed)
Has not had labs since 03/2019. Cancelled last appt due to lack of transportation. OK to send in for 30 days?

## 2020-05-29 ENCOUNTER — Other Ambulatory Visit: Payer: Self-pay | Admitting: Family Medicine

## 2020-05-30 NOTE — Telephone Encounter (Signed)
Pharmacy requests refill on: Amitriptyline 100 mg   LAST REFILL: 05/07/2020 (Q-60, R-1) *Requesting 90 day supply* LAST OV: 10/26/2019 NEXT OV: Not Scheduled  PHARMACY: [id number] [place], [place]

## 2020-06-29 ENCOUNTER — Telehealth: Payer: Self-pay

## 2020-06-29 DIAGNOSIS — E079 Disorder of thyroid, unspecified: Secondary | ICD-10-CM

## 2020-06-29 MED ORDER — AMITRIPTYLINE HCL 100 MG PO TABS: 200.0000 mg | ORAL_TABLET | Freq: Every day | ORAL | 0 refills | Status: DC

## 2020-06-29 NOTE — Telephone Encounter (Signed)
She has not had a TSH lab test in over one year, she must have that in order to continue with her levothyroxine.   It looks like Taylor Benitez wanted to see her for further refills of the amitriptyline.   She really needs updated labs and follow up. Have her schedule a lab appointment for TSH check, once she schedules the lab appointment you can send in 30 days of her levothyroxine, no refills. I will order TSH.  I will refill amitriptyline for 30 days, but she will need to get signed up with someone for TOC. Please ensure this is done.  Thanks!

## 2020-06-29 NOTE — Telephone Encounter (Signed)
Pharmacy requests refill on: Levothyroxine 150 mcg  LAST REFILL: 05/28/2020 (Q-30, R-0) LAST OV: 10/26/2019 NEXT OV: Not Scheduled  PHARMACY: [id number] [place], [place] Last TSH: 03/28/2019  Pharmacy requests refill on: Amitriptyline HCL 100 mg    LAST REFILL: 05/07/2020 (Q-60, R-1) LAST OV: 10/26/2019 NEXT OV: Not Scheduled  PHARMACY: [id number] [place], [place]

## 2020-07-02 NOTE — Telephone Encounter (Signed)
Called and left voicemail for patient to schedule lab appointment

## 2020-07-03 NOTE — Telephone Encounter (Signed)
Called and left voicemail for patient to schedule a lab appointment to check her TSH.

## 2020-07-05 NOTE — Telephone Encounter (Signed)
Called and left voicemail for patient to return call to office.  °

## 2020-07-18 NOTE — Telephone Encounter (Signed)
Lvm asking pt to call office 

## 2020-07-23 ENCOUNTER — Encounter: Payer: Self-pay | Admitting: Family Medicine

## 2020-07-23 NOTE — Telephone Encounter (Signed)
Mailed letter due to not being able to reach patient

## 2020-07-25 DIAGNOSIS — M545 Low back pain, unspecified: Secondary | ICD-10-CM | POA: Diagnosis not present

## 2020-07-25 DIAGNOSIS — G894 Chronic pain syndrome: Secondary | ICD-10-CM | POA: Diagnosis not present

## 2020-07-25 DIAGNOSIS — G8929 Other chronic pain: Secondary | ICD-10-CM | POA: Diagnosis not present

## 2020-07-25 DIAGNOSIS — E119 Type 2 diabetes mellitus without complications: Secondary | ICD-10-CM | POA: Diagnosis not present

## 2020-07-25 DIAGNOSIS — Z79899 Other long term (current) drug therapy: Secondary | ICD-10-CM | POA: Diagnosis not present

## 2020-07-27 ENCOUNTER — Other Ambulatory Visit: Payer: Self-pay | Admitting: Primary Care

## 2020-07-27 NOTE — Telephone Encounter (Signed)
Refill request Amtriptyline Last office visit 10/26/19 Last refill 06/29/20 #60 See previous refill request notes Messages have been left for patient to schedule an appointment and a letter has been mailed to her. No upcoming appointments scheduled

## 2020-07-27 NOTE — Telephone Encounter (Signed)
It looks as though she has been active on my chart in the past. I will send her a MyChart message requesting that she call our office for an appointment.  I will check back on this next week to see if she is responding.  If she does not schedule a visit and has not seen, then I will work on weaning her off of her amitriptyline safely.

## 2020-08-07 ENCOUNTER — Telehealth: Payer: Self-pay | Admitting: Family Medicine

## 2020-08-07 NOTE — Telephone Encounter (Signed)
Patient has an appointment this Thursday at 1030 with Dr. Milinda Antis for medication refill and lab appointment to check labs. Will set up [facility] appointment at this visit.

## 2020-08-07 NOTE — Telephone Encounter (Signed)
Pt is calling due to she is out of the levothyroxine and amitriptyline.  [facility]  [address]

## 2020-08-07 NOTE — Telephone Encounter (Signed)
Pharmacy requests refill on: Levothyroxine 150 mcg   LAST REFILL: 05/28/2020 (Q-30, R-0) LAST OV: 10/26/2019 NEXT OV: Not Scheduled  PHARMACY: [id number] [place], [place]   Pharmacy requests refill on: Amitriptyline 100 mg   LAST REFILL: 06/29/2020 (Q-60, R-0) LAST OV: 10/26/2019 NEXT OV: Not Scheduled  PHARMACY: [id number] [place], [place]   Next lab appointment to check TSH is on 08/09/2020. Patient has been contacted multiple times to set up a TOC appointment.

## 2020-08-07 NOTE — Telephone Encounter (Signed)
She needs an appointment. Can be acute for refills and arrange TOC later.

## 2020-08-09 ENCOUNTER — Other Ambulatory Visit: Payer: Medicare Other

## 2020-08-09 ENCOUNTER — Other Ambulatory Visit: Payer: Self-pay

## 2020-08-09 ENCOUNTER — Ambulatory Visit (INDEPENDENT_AMBULATORY_CARE_PROVIDER_SITE_OTHER): Payer: Medicare Other | Admitting: Family Medicine

## 2020-08-09 ENCOUNTER — Encounter: Payer: Self-pay | Admitting: Family Medicine

## 2020-08-09 VITALS — BP 126/78 | HR 107 | Temp 96.9°F | Ht 61.0 in | Wt 150.4 lb

## 2020-08-09 DIAGNOSIS — I10 Essential (primary) hypertension: Secondary | ICD-10-CM | POA: Diagnosis not present

## 2020-08-09 DIAGNOSIS — E079 Disorder of thyroid, unspecified: Secondary | ICD-10-CM

## 2020-08-09 DIAGNOSIS — G894 Chronic pain syndrome: Secondary | ICD-10-CM

## 2020-08-09 DIAGNOSIS — M1712 Unilateral primary osteoarthritis, left knee: Secondary | ICD-10-CM

## 2020-08-09 DIAGNOSIS — E1165 Type 2 diabetes mellitus with hyperglycemia: Secondary | ICD-10-CM | POA: Diagnosis not present

## 2020-08-09 DIAGNOSIS — M1612 Unilateral primary osteoarthritis, left hip: Secondary | ICD-10-CM

## 2020-08-09 DIAGNOSIS — Z9189 Other specified personal risk factors, not elsewhere classified: Secondary | ICD-10-CM

## 2020-08-09 DIAGNOSIS — G6289 Other specified polyneuropathies: Secondary | ICD-10-CM

## 2020-08-09 MED ORDER — LEVOTHYROXINE SODIUM 150 MCG PO TABS: 150.0000 ug | ORAL_TABLET | Freq: Every day | ORAL | 5 refills | Status: DC

## 2020-08-09 MED ORDER — AMITRIPTYLINE HCL 100 MG PO TABS: 200.0000 mg | ORAL_TABLET | Freq: Every day | ORAL | 5 refills | Status: DC

## 2020-08-09 NOTE — Assessment & Plan Note (Signed)
Takes amitriptyline for this

## 2020-08-09 NOTE — Patient Instructions (Addendum)
For diabetes Try to get most of your carbohydrates from produce (with the exception of white potatoes)  Eat less bread/pasta/rice/snack foods/cereals/sweets and other items from the middle of the grocery store (processed carbs)  For cholesterol Avoid red meat/ fried foods/ egg yolks/ fatty breakfast meats/ butter, cheese and high fat dairy/ and shellfish    Walk - gradually increase this  Goal of 30 minutes or more per day    Schedule fasting labs for a month

## 2020-08-09 NOTE — Assessment & Plan Note (Signed)
Both knees replaced Doing better

## 2020-08-09 NOTE — Assessment & Plan Note (Signed)
narcotic

## 2020-08-09 NOTE — Progress Notes (Signed)
Subjective:    Patient ID: Taylor Benitez, female    DOB: [birth year], 60 y.o.   MRN: [id number]  This visit occurred during the SARS-CoV-2 public health emergency.  Safety protocols were in place, including screening questions prior to the visit, additional usage of staff PPE, and extensive cleaning of exam room while observing appropriate contact time as indicated for disinfecting solutions.    HPI 60 yo pt of NP Leone Payor presents for f/u of chronic health problems   Wt Readings from Last 3 Encounters:  08/09/20 150 lb 7 oz (68.2 kg)  11/02/19 149 lb 14.6 oz (68 kg)  10/26/19 150 lb (68 kg)   28.42 kg/m   Had transportation issues Now with new insurance and will be able to come now    Has 2 artificial knees and hips now (last surg 5/21) Survived covid 3 times Is immunized No longer using walker or cane    HTN bp is stable today  No cp or palpitations or headaches or edema  No side effects to medicines  BP Readings from Last 3 Encounters:  08/09/20 126/78  11/04/19 (!) 103/59  10/26/19 137/73     Does not take medicine Lab Results  Component Value Date   CREATININE 0.81 11/04/2019   BUN 26 (H) 11/04/2019   NA 137 11/04/2019   K 3.6 11/04/2019   CL 103 11/04/2019   CO2 25 11/04/2019   Hypothyroid Takes levothyroxine 150 mcg daily  Lab Results  Component Value Date   TSH 0.49 03/28/2019   Due for labs today  Has been out of that for 4 days   Goes to pain clinic for oxycodone   Takes amitriptyline for mood  H/o anxiety and insomnia and neuropathy  H/o DM2 Lab Results  Component Value Date   HGBA1C 7.6 (H) 10/26/2019  does not take medicine for that  Does try to eat right-recently started  Past-metformin and glipzide - GI   Has eye exam set up  Has new insurance that is better   Trying to walk now  Diabetic diet about 50% of the time Has never done classes Her mother is diabetic (bad)   Patient Active Problem List   Diagnosis Date Noted  .  Primary osteoarthritis of left hip 11/02/2019  . Other cirrhosis of liver (HCC) 11/17/2016  . History of small bowel obstruction 11/14/2016  . Chronic narcotic use 01/09/2016  . H/O drug overdose   . Thyroid disease 12/09/2015  . Type 2 diabetes mellitus with hyperglycemia (HCC) 12/09/2015  . Arthritis 12/09/2015  . Essential hypertension 01/09/2015  . Chronic pain 04/19/2014  . Insomnia 03/21/2014  . Anxiety attack 11/29/2013  . Seronegative rheumatoid arthritis (HCC) 11/16/2013  . Peripheral neuropathy 11/16/2013  . Osteoarthritis of left knee 11/16/2013  . NONSPECIFIC ABN FINDING RAD & OTH EXAM GI TRACT 01/30/2009  . WHEEZING 06/30/2007   Past Medical History:  Diagnosis Date  . Arthritis    rhumatoid and osteo  . Cirrhosis of liver (HCC)   . Diabetes (HCC)   . Hypertension   . Small bowel obstruction (HCC)   . Thyroid disease    Past Surgical History:  Procedure Laterality Date  . ABDOMINAL HYSTERECTOMY    . BREAST REDUCTION SURGERY Bilateral    1989  . COLON SURGERY     colon abcess, partial colectomy and colostomy with reversal  . COLOSTOMY  2006  . JOINT REPLACEMENT     Total knee replacement  . KNEE SURGERY Bilateral  total knee replacement  . SMALL INTESTINE SURGERY     bowel obstruction x 2  . TONSILLECTOMY    . TOTAL HIP ARTHROPLASTY Right 2019  . TOTAL HIP ARTHROPLASTY Left 11/02/2019   Procedure: TOTAL HIP ARTHROPLASTY ANTERIOR APPROACH;  Surgeon: Ollen Gross, MD;  Location: [facility];  Service: Orthopedics;  Laterality: Left;    Social History   Tobacco Use  . Smoking status: Former Smoker    Types: E-cigarettes, Cigarettes    Quit date: 01/2019    Years since quitting: 1.5  . Smokeless tobacco: Never Used  Vaping Use  . Vaping Use: Former  Substance Use Topics  . Alcohol use: No    Comment: Glass of Wine now and then 1 once 1-2 weekd  . Drug use: Never   Family History  Problem Relation Age of Onset  . Arthritis Mother   .  Depression Mother   . Diabetes Mother   . Hearing loss Mother   . Stroke Mother   . Cancer Father   . Diabetes Father   . Heart attack Father   . Heart disease Father   . Stroke Father   . Depression Sister   . Drug abuse Sister   . Depression Brother   . Arthritis Maternal Grandmother   . Cancer Maternal Grandfather   . COPD Maternal Grandfather   . Depression Maternal Grandfather   . Depression Paternal Grandmother   . Hyperlipidemia Paternal Grandmother   . Heart disease Paternal Grandmother    Allergies  Allergen Reactions  . Glipizide     GI side eff  . Metformin And Related     GI side effects   . Penicillins Rash    Tolerated Cephalosporin Date: 11/04/19.    Current Outpatient Medications on File Prior to Visit  Medication Sig Dispense Refill  . albuterol (VENTOLIN HFA) 108 (90 Base) MCG/ACT inhaler Inhale 1-2 puffs into the lungs every 6 (six) hours as needed for wheezing or shortness of breath.     . Baclofen 5 MG TABS Take 5 mg by mouth 2 (two) times daily as needed for spasms.    . Cholecalciferol (VITAMIN D3) 1.25 MG (50000 UT) TABS Take 1 tablet by mouth every 7 (seven) days. 12 tablet 3  . hydrOXYzine (ATARAX/VISTARIL) 25 MG tablet TAKE 1/2 TO 1 TABLET BY MOUTH EVERY 8 HOURS AS NEEDED FOR ANXETY 30 tablet 1  . oxyCODONE (OXY IR/ROXICODONE) 5 MG immediate release tablet Take 1-2 tablets (5-10 mg total) by mouth every 6 (six) hours as needed for severe pain. 56 tablet 0   No current facility-administered medications on file prior to visit.    Review of Systems  Constitutional: Negative for activity change, appetite change, fatigue, fever and unexpected weight change.  HENT: Negative for congestion, ear pain, rhinorrhea, sinus pressure and sore throat.   Eyes: Negative for pain, redness and visual disturbance.  Respiratory: Negative for cough, shortness of breath and wheezing.   Cardiovascular: Negative for chest pain and palpitations.  Gastrointestinal:  Negative for abdominal pain, blood in stool, constipation and diarrhea.  Endocrine: Negative for polydipsia and polyuria.  Genitourinary: Negative for dysuria, frequency and urgency.  Musculoskeletal: Positive for arthralgias. Negative for back pain and myalgias.  Skin: Negative for pallor and rash.  Allergic/Immunologic: Negative for environmental allergies.  Neurological: Positive for numbness. Negative for dizziness, syncope and headaches.  Hematological: Negative for adenopathy. Does not bruise/bleed easily.  Psychiatric/Behavioral: Negative for decreased concentration and dysphoric mood. The patient is not  nervous/anxious.        Objective:   Physical Exam Constitutional:      General: She is not in acute distress.    Appearance: Normal appearance. She is well-developed, normal weight and well-nourished. She is not ill-appearing or diaphoretic.  HENT:     Head: Normocephalic and atraumatic.     Mouth/Throat:     Mouth: Oropharynx is clear and moist.  Eyes:     Extraocular Movements: EOM normal.     Conjunctiva/sclera: Conjunctivae normal.     Pupils: Pupils are equal, round, and reactive to light.  Neck:     Thyroid: No thyromegaly.     Vascular: No carotid bruit or JVD.  Cardiovascular:     Rate and Rhythm: Normal rate and regular rhythm.     Pulses: Intact distal pulses.     Heart sounds: Normal heart sounds. No gallop.   Pulmonary:     Effort: Pulmonary effort is normal. No respiratory distress.     Breath sounds: Normal breath sounds. No wheezing or rales.     Comments: No crackles Abdominal:     General: Bowel sounds are normal. There is no distension or abdominal bruit.     Palpations: Abdomen is soft. There is no mass.     Tenderness: There is no abdominal tenderness.  Musculoskeletal:        General: No edema.     Cervical back: Normal range of motion and neck supple.     Right lower leg: No edema.     Left lower leg: No edema.  Lymphadenopathy:      Cervical: No cervical adenopathy.  Skin:    General: Skin is warm and dry.     Coloration: Skin is not pale.     Findings: No erythema or rash.  Neurological:     Mental Status: She is alert.     Sensory: No sensory deficit.     Coordination: Coordination normal.     Deep Tendon Reflexes: Reflexes are normal and symmetric. Reflexes normal.  Psychiatric:        Mood and Affect: Mood and affect and mood normal.           Assessment & Plan:   Problem List Items Addressed This Visit      Cardiovascular and Mediastinum   Essential hypertension    bp in fair control at this time  BP Readings from Last 1 Encounters:  08/09/20 126/78   No changes needed (not currently taking medicine) Most recent labs reviewed  Disc lifstyle change with low sodium diet and exercise  Consider ace inhibitor in the future since she  Is diabetic        Endocrine   Thyroid disease    Hypothyroid  Missed med (levothyroxine 150) for 4 days so we will not check tsh today  Plan lab in a month No clinical changes      Relevant Medications   levothyroxine (SYNTHROID) 150 MCG tablet   Type 2 diabetes mellitus with hyperglycemia (HCC) - Primary    Pt was unable to tolerate metformin and glipizide Wants to check a1c before planning tx  Has not had dm ed  Not on statin or ace yet  Wants to work on diet and then go from there Planning a1c in 3 mo        Nervous and Auditory   Peripheral neuropathy    Takes amitriptyline for this       Relevant Medications   amitriptyline (ELAVIL)  100 MG tablet     Musculoskeletal and Integument   Osteoarthritis of left knee    Both knees replaced Doing better      Primary osteoarthritis of left hip    Both hips replaced Doing better        Other   H/O drug overdose    narcotic      Chronic pain    Goes to pain management clinic Taking oxycodone      Relevant Medications   amitriptyline (ELAVIL) 100 MG tablet

## 2020-08-09 NOTE — Assessment & Plan Note (Signed)
Both hips replaced Doing better

## 2020-08-09 NOTE — Assessment & Plan Note (Signed)
Goes to pain management clinic Taking oxycodone

## 2020-08-09 NOTE — Assessment & Plan Note (Signed)
Pt was unable to tolerate metformin and glipizide Wants to check a1c before planning tx  Has not had dm ed  Not on statin or ace yet  Wants to work on diet and then go from there Planning a1c in 3 mo

## 2020-08-09 NOTE — Assessment & Plan Note (Signed)
bp in fair control at this time  BP Readings from Last 1 Encounters:  08/09/20 126/78   No changes needed (not currently taking medicine) Most recent labs reviewed  Disc lifstyle change with low sodium diet and exercise  Consider ace inhibitor in the future since she  Is diabetic

## 2020-08-09 NOTE — Assessment & Plan Note (Signed)
Hypothyroid  Missed med (levothyroxine 150) for 4 days so we will not check tsh today  Plan lab in a month No clinical changes

## 2020-08-09 NOTE — Telephone Encounter (Signed)
Open in error

## 2020-09-06 ENCOUNTER — Other Ambulatory Visit: Payer: Medicare Other

## 2020-10-18 ENCOUNTER — Other Ambulatory Visit: Payer: Self-pay

## 2020-10-18 DIAGNOSIS — F419 Anxiety disorder, unspecified: Secondary | ICD-10-CM

## 2020-10-18 MED ORDER — HYDROXYZINE HCL 25 MG PO TABS: ORAL_TABLET | ORAL | 1 refills | Status: DC

## 2020-10-18 NOTE — Telephone Encounter (Signed)
Pharmacy requests refill on: Hydroxyzine 25 mg   LAST REFILL: 02/17/2020 (Q-30, R-1) LAST OV: 08/09/2020 NEXT OV: Not Scheduled  [facility]ACY: [id number] [place], [place]

## 2020-12-14 ENCOUNTER — Other Ambulatory Visit: Payer: Self-pay | Admitting: Internal Medicine

## 2020-12-14 DIAGNOSIS — Z1231 Encounter for screening mammogram for malignant neoplasm of breast: Secondary | ICD-10-CM

## 2021-01-13 ENCOUNTER — Other Ambulatory Visit: Payer: Self-pay | Admitting: Family Medicine

## 2021-01-14 NOTE — Telephone Encounter (Signed)
Pt had her med refill appt in Feb but was suppose to have fasting labs done a month later but it looks like she cancelled/no-showed it and never r/s, please call pt and r/s fasting lab appt since it's way overdue. Once lab appt has been scheduled please route back to me to refill

## 2021-01-18 NOTE — Telephone Encounter (Signed)
Left voice message to call the office  

## 2021-01-24 NOTE — Telephone Encounter (Signed)
Attempted to reach patient to schedule. No answer. Lvm asking her to call office

## 2021-02-07 NOTE — Telephone Encounter (Signed)
Call patient Left voice message to call the office

## 2021-02-11 ENCOUNTER — Encounter: Payer: Self-pay | Admitting: Family Medicine

## 2021-02-11 NOTE — Telephone Encounter (Signed)
Mychart letter sent  Please close message

## 2021-02-14 ENCOUNTER — Other Ambulatory Visit: Payer: Self-pay | Admitting: Family Medicine

## 2021-03-17 ENCOUNTER — Other Ambulatory Visit: Payer: Self-pay | Admitting: Family Medicine

## 2021-06-14 ENCOUNTER — Other Ambulatory Visit: Payer: Self-pay | Admitting: Family Medicine

## 2021-07-02 ENCOUNTER — Emergency Department (HOSPITAL_COMMUNITY)
Admission: EM | Admit: 2021-07-02 | Discharge: 2021-07-02 | Disposition: A | Discharge status: Home / Self Care | Payer: Medicare Other | Attending: Emergency Medicine | Admitting: Emergency Medicine

## 2021-07-02 ENCOUNTER — Encounter (HOSPITAL_COMMUNITY): Payer: Self-pay

## 2021-07-02 DIAGNOSIS — F1910 Other psychoactive substance abuse, uncomplicated: Secondary | ICD-10-CM | POA: Insufficient documentation

## 2021-07-02 DIAGNOSIS — F10129 Alcohol abuse with intoxication, unspecified: Secondary | ICD-10-CM | POA: Diagnosis not present

## 2021-07-02 DIAGNOSIS — E119 Type 2 diabetes mellitus without complications: Secondary | ICD-10-CM | POA: Diagnosis not present

## 2021-07-02 DIAGNOSIS — Z79899 Other long term (current) drug therapy: Secondary | ICD-10-CM | POA: Diagnosis not present

## 2021-07-02 DIAGNOSIS — Y900 Blood alcohol level of less than 20 mg/100 ml: Secondary | ICD-10-CM | POA: Diagnosis not present

## 2021-07-02 DIAGNOSIS — F17200 Nicotine dependence, unspecified, uncomplicated: Secondary | ICD-10-CM | POA: Insufficient documentation

## 2021-07-02 DIAGNOSIS — F1990 Other psychoactive substance use, unspecified, uncomplicated: Secondary | ICD-10-CM

## 2021-07-02 LAB — CBC WITH DIFFERENTIAL/PLATELET
Abs Immature Granulocytes: 0.05 10*3/uL (ref 0.00–0.07)
Basophils Absolute: 0 10*3/uL (ref 0.0–0.1)
Basophils Relative: 0 %
Eosinophils Absolute: 0.1 10*3/uL (ref 0.0–0.5)
Eosinophils Relative: 1 %
HCT: 35.5 % — ABNORMAL LOW (ref 36.0–46.0)
Hemoglobin: 11.2 g/dL — ABNORMAL LOW (ref 12.0–15.0)
Immature Granulocytes: 1 %
Lymphocytes Relative: 17 %
Lymphs Abs: 1.6 10*3/uL (ref 0.7–4.0)
MCH: 28.7 pg (ref 26.0–34.0)
MCHC: 31.5 g/dL (ref 30.0–36.0)
MCV: 91 fL (ref 80.0–100.0)
Monocytes Absolute: 0.3 10*3/uL (ref 0.1–1.0)
Monocytes Relative: 3 %
Neutro Abs: 7.4 10*3/uL (ref 1.7–7.7)
Neutrophils Relative %: 78 %
Platelets: 173 10*3/uL (ref 150–400)
RBC: 3.9 MIL/uL (ref 3.87–5.11)
RDW: 16.4 % — ABNORMAL HIGH (ref 11.5–15.5)
WBC: 9.4 10*3/uL (ref 4.0–10.5)
nRBC: 0 % (ref 0.0–0.2)

## 2021-07-02 LAB — AMMONIA: Ammonia: 14 umol/L (ref 9–35)

## 2021-07-02 LAB — RAPID URINE DRUG SCREEN, HOSP PERFORMED
Amphetamines: NOT DETECTED
Barbiturates: NOT DETECTED
Benzodiazepines: NOT DETECTED
Cocaine: POSITIVE — AB
Opiates: NOT DETECTED
Tetrahydrocannabinol: POSITIVE — AB

## 2021-07-02 LAB — ETHANOL: Alcohol, Ethyl (B): <10 mg/dL (ref <10)

## 2021-07-02 LAB — COMPREHENSIVE METABOLIC PANEL
ALT: 16 U/L (ref 0–44)
AST: 22 U/L (ref 15–41)
Albumin: 4.4 g/dL (ref 3.5–5.0)
Alkaline Phosphatase: 61 U/L (ref 38–126)
Anion gap: 13 (ref 5–15)
BUN: 26 mg/dL — ABNORMAL HIGH (ref 8–23)
CO2: 23 mmol/L (ref 22–32)
Calcium: 9.3 mg/dL (ref 8.9–10.3)
Chloride: 101 mmol/L (ref 98–111)
Creatinine, Ser: 1.59 mg/dL — ABNORMAL HIGH (ref 0.44–1.00)
GFR, Estimated: 37 mL/min — ABNORMAL LOW (ref >60)
Glucose, Bld: 237 mg/dL — ABNORMAL HIGH (ref 70–99)
Potassium: 4.1 mmol/L (ref 3.5–5.1)
Sodium: 137 mmol/L (ref 135–145)
Total Bilirubin: 0.6 mg/dL (ref 0.3–1.2)
Total Protein: 8.2 g/dL — ABNORMAL HIGH (ref 6.5–8.1)

## 2021-07-02 NOTE — ED Notes (Signed)
Attempted to call safe transport to get pt transported home, no answer. This RN called after hours number and still got no answer. Voicemail left.

## 2021-07-02 NOTE — ED Notes (Signed)
Safe transport set up for pt d/c

## 2021-07-02 NOTE — ED Notes (Signed)
Attempted to call daughter with no answer. This RN also attempted to call pts home phone with no answer.

## 2021-07-02 NOTE — ED Notes (Signed)
Attempted to call daughter to get pt a ride home, no answer and mailbox is full, unable to leave a message.

## 2021-07-02 NOTE — ED Provider Notes (Signed)
[facility]-EMERGENCY DEPT Provider Note   CSN: [id number] Arrival date & time: 07/02/21  0020     History  Chief Complaint  Patient presents with   Alcohol Intoxication    Taylor Benitez is a 61 y.o. female.  The history is provided by the patient and medical records.  Alcohol Intoxication  61 y.o. F with hx of cirrhosis, chronic narcotic use, thyroid disease, DM2, anxiety, presenting to the ED for alcohol intoxication.  EMS called to home as patient was stumbling around the house.  Apparently, she has been drinking alcohol all day, smoked marijuana, and possibly used some oxycodone which she is prescribed.  Patient admits to have 1 drink tonight.  She denies drug use.  Home Medications Prior to Admission medications   Medication Sig Start Date End Date Taking? Authorizing Provider  albuterol (VENTOLIN HFA) 108 (90 Base) MCG/ACT inhaler Inhale 1-2 puffs into the lungs every 6 (six) hours as needed for wheezing or shortness of breath.  02/13/16   [provider]  amitriptyline (ELAVIL) 100 MG tablet TAKE 2 TABLETS BY MOUTH AT BEDTIME 06/14/21   Tower, Reno A, MD  Baclofen 5 MG TABS Take 5 mg by mouth 2 (two) times daily as needed for spasms. 09/15/19   [provider]  Cholecalciferol (VITAMIN D3) 1.25 MG (50000 UT) TABS Take 1 tablet by mouth every 7 (seven) days. 12/19/19   Emi Belfast, FNP  hydrOXYzine (ATARAX/VISTARIL) 25 MG tablet TAKE 1/2 TO 1 TABLET BY MOUTH EVERY 8 HOURS AS NEEDED FOR ANXETY 10/18/20   Tower, Audrie Gallus, MD  levothyroxine (SYNTHROID) 150 MCG tablet Take 1 tablet (150 mcg total) by mouth daily. 08/09/20   Tower, Audrie Gallus, MD  oxyCODONE (OXY IR/ROXICODONE) 5 MG immediate release tablet Take 1-2 tablets (5-10 mg total) by mouth every 6 (six) hours as needed for severe pain. 11/04/19   Edmisten, Kristie L, PA      Allergies    Glipizide, Metformin and related, and Penicillins    Review of Systems   Review of Systems   Psychiatric/Behavioral:         Intoxication  All other systems reviewed and are negative.  Physical Exam Updated Vital Signs BP 127/68 (BP Location: Right Arm)    Pulse 92    Temp 98.1 F (36.7 C) (Oral)    Resp 18    SpO2 98%   Physical Exam Vitals and nursing note reviewed.  Constitutional:      Appearance: She is well-developed.  HENT:     Head: Normocephalic and atraumatic.     Comments: No visible head trauma Eyes:     Conjunctiva/sclera: Conjunctivae normal.     Pupils: Pupils are equal, round, and reactive to light.  Cardiovascular:     Rate and Rhythm: Normal rate and regular rhythm.     Heart sounds: Normal heart sounds.  Pulmonary:     Effort: Pulmonary effort is normal.     Breath sounds: Normal breath sounds.  Abdominal:     General: Bowel sounds are normal.     Palpations: Abdomen is soft.  Musculoskeletal:        General: Normal range of motion.     Cervical back: Normal range of motion.  Skin:    General: Skin is warm and dry.  Neurological:     Mental Status: She is alert and oriented to person, place, and time.     Comments: AAOx3, able to answer questions and follow commands, moving  extremities well, no focal deficits    ED Results / Procedures / Treatments   Labs (all labs ordered are listed, but only abnormal results are displayed) Labs Reviewed  CBC WITH DIFFERENTIAL/PLATELET - Abnormal; Notable for the following components:      Result Value   Hemoglobin 11.2 (*)    HCT 35.5 (*)    RDW 16.4 (*)    All other components within normal limits  COMPREHENSIVE METABOLIC PANEL - Abnormal; Notable for the following components:   Glucose, Bld 237 (*)    BUN 26 (*)    Creatinine, Ser 1.59 (*)    Total Protein 8.2 (*)    GFR, Estimated 37 (*)    All other components within normal limits  RAPID URINE DRUG SCREEN, HOSP PERFORMED - Abnormal; Notable for the following components:   Cocaine POSITIVE (*)    Tetrahydrocannabinol POSITIVE (*)    All  other components within normal limits  ETHANOL  AMMONIA    EKG None  Radiology No results found.  Procedures Procedures    Medications Ordered in ED Medications - No data to display  ED Course/ Medical Decision Making/ A&P                           Medical Decision Making  12 y.o. F presenting to the ED for AMS.  Roommate reported she was drinking today, smoking marijuana and taking oxycodone.  Apparently started stumbling around this evening and EMS was called.  Patient admits to 1 drink and taking her usual medication which include oxycodone.    She is currently awake, alert, oriented.  She is answering questions and following commands.  She has no focal deficits on exam.  She has no visible signs of head trauma.  Chart reviewed, a few similar visits previously secondary to polysubstance abuse.  Suspect same.  Labs ordered and reviewed-- mild hyperglycemia without findings of DKA.  Ethanol is negative.  Her UDS is positive for marijuana and cocaine which I suspect is contributing to her current presentation.  Patient has been observed here in the ED and remains stable.  Feel she is stable for discharge home with OP follow-up.  Encouraged to refrain from illicit drug use, only take home medications as prescribed.  Can follow-up with PCP.  Return here for new concerns.  Final Clinical Impression(s) / ED Diagnoses Final diagnoses:  Polysubstance use disorder    Rx / DC Orders ED Discharge Orders     None         Garlon Hatchet, PA-C 07/02/21 0559    Sloan Leiter, DO 07/02/21 [phone]

## 2021-07-02 NOTE — ED Notes (Signed)
Pt in bed with eyes closed, pt arouses to verbal stim, upon arousal pt is only oriented to self, pt responds with "yes" and "sorry" to any other questions asked of her.

## 2021-07-02 NOTE — ED Notes (Signed)
Pt able to answer A&O questions. Pt states she feels comfortable getting home by safe transport services.

## 2021-07-02 NOTE — ED Notes (Signed)
Pt in bed, pt more awake, pt oriented to person and place, doesn't know day of the week, month or year, talked with pt about how to get her home and who could come pick her up.

## 2021-07-02 NOTE — Discharge Instructions (Addendum)
Refrain from using illicit drugs. Take your home medications only as prescribed-- do not take extra doses. Follow-up with your primary care doctor. Return here for new concerns.

## 2021-07-02 NOTE — ED Triage Notes (Signed)
Patient was brought in by EMS from home. Roommate called EMS after patient had been falling at home. Roommate reports patient drinking all day, marijuana use, and possible oxycodone use. Patient denies and states she only had 1 drink. Patient was A&O x3 with EMS.

## 2021-07-02 NOTE — ED Notes (Signed)
Attempted to call daughter to come get her, no answer.

## 2021-07-25 ENCOUNTER — Encounter (HOSPITAL_BASED_OUTPATIENT_CLINIC_OR_DEPARTMENT_OTHER): Payer: Self-pay | Admitting: Podiatry

## 2021-07-30 ENCOUNTER — Encounter (HOSPITAL_BASED_OUTPATIENT_CLINIC_OR_DEPARTMENT_OTHER): Payer: Self-pay | Admitting: Podiatry

## 2021-07-30 ENCOUNTER — Other Ambulatory Visit: Payer: Self-pay

## 2021-07-30 NOTE — Progress Notes (Addendum)
Spoke w/ via phone for pre-op interview--- pt Lab needs dos----  ask [facility] dos if urine drug screen needed since pt had positive UDS in epic 07-02-2021 for cocaine (entered order)             Lab results------ pt has current lab work done 07-02-2021 results in epic , CBCdiff/ CMP, and EKG COVID test -----patient states asymptomatic no test needed Arrive at ------- 1200 on 08-01-2021 NPO after MN NO Solid Food.  Clear liquids from MN until--- 1100 Med rec completed Medications to take morning of surgery ----- synthroid Diabetic medication ----- do not take metformin/ jardiance morning of surgery Patient instructed no nail polish to be worn day of surgery Patient instructed to bring photo id and insurance card day of surgery  Patient aware to have Driver (ride ) / caregiver for 24 hours after surgery --friend, ceresa rateliff Patient Special Instructions ----- n/a Pre-Op special Istructions ----- have not received pt's pcp H&P yet from Dr Theresia Bough office. Pt stated no cocaine use since being positive 07-02-2021 and smokes marijuana weekly. Sent inbox message to dr Theresia Bough in epic , requested orders  Patient verbalized understanding of instructions that were given at this phone interview. Patient denies shortness of breath, chest pain, fever, cough at this phone interview.

## 2021-07-31 ENCOUNTER — Encounter (HOSPITAL_COMMUNITY): Payer: Self-pay | Admitting: Anesthesiology

## 2021-08-01 ENCOUNTER — Ambulatory Visit (HOSPITAL_BASED_OUTPATIENT_CLINIC_OR_DEPARTMENT_OTHER): Admission: RE | Admit: 2021-08-01 | Payer: Medicare Other | Source: Home / Self Care | Admitting: Podiatry

## 2021-08-01 DIAGNOSIS — Z01818 Encounter for other preprocedural examination: Secondary | ICD-10-CM

## 2021-08-01 HISTORY — DX: Complete loss of teeth, unspecified cause, unspecified class: K08.109

## 2021-08-01 HISTORY — DX: Hypothyroidism, unspecified: E03.9

## 2021-08-01 HISTORY — DX: Personal history of other mental and behavioral disorders: Z86.59

## 2021-08-01 HISTORY — DX: Unspecified osteoarthritis, unspecified site: M19.90

## 2021-08-01 HISTORY — DX: Cannabis abuse, uncomplicated: F12.10

## 2021-08-01 HISTORY — DX: Type 2 diabetes mellitus without complications: E11.9

## 2021-08-01 HISTORY — DX: Other chronic pain: G89.29

## 2021-08-01 HISTORY — DX: Opioid use, unspecified, uncomplicated: F11.90

## 2021-08-01 HISTORY — DX: Rheumatoid arthritis without rheumatoid factor, unspecified site: M06.00

## 2021-08-01 HISTORY — DX: Complete loss of teeth, unspecified cause, unspecified class: Z97.2

## 2021-08-01 HISTORY — DX: Osteomyelitis, unspecified: M86.9

## 2021-08-01 HISTORY — DX: Polyneuropathy, unspecified: G62.9

## 2021-08-01 HISTORY — DX: Cocaine abuse, uncomplicated: F14.10

## 2021-08-01 IMAGING — RF DG C-ARM 1-60 MIN-NO REPORT
1 series · 2 of 2 positions shown · non-contrast
Comparison: CT abdomen pelvis January 24, 2005.

CLINICAL DATA: Intraoperative left hip replacement

EXAM:
OPERATIVE LEFT HIP WITH PELVIS; DG C-ARM 1-60 MIN-NO REPORT

[Series 1: unknown protocol · 0.20mm/px · 2 of 2 slices shown]
[im 1/2]
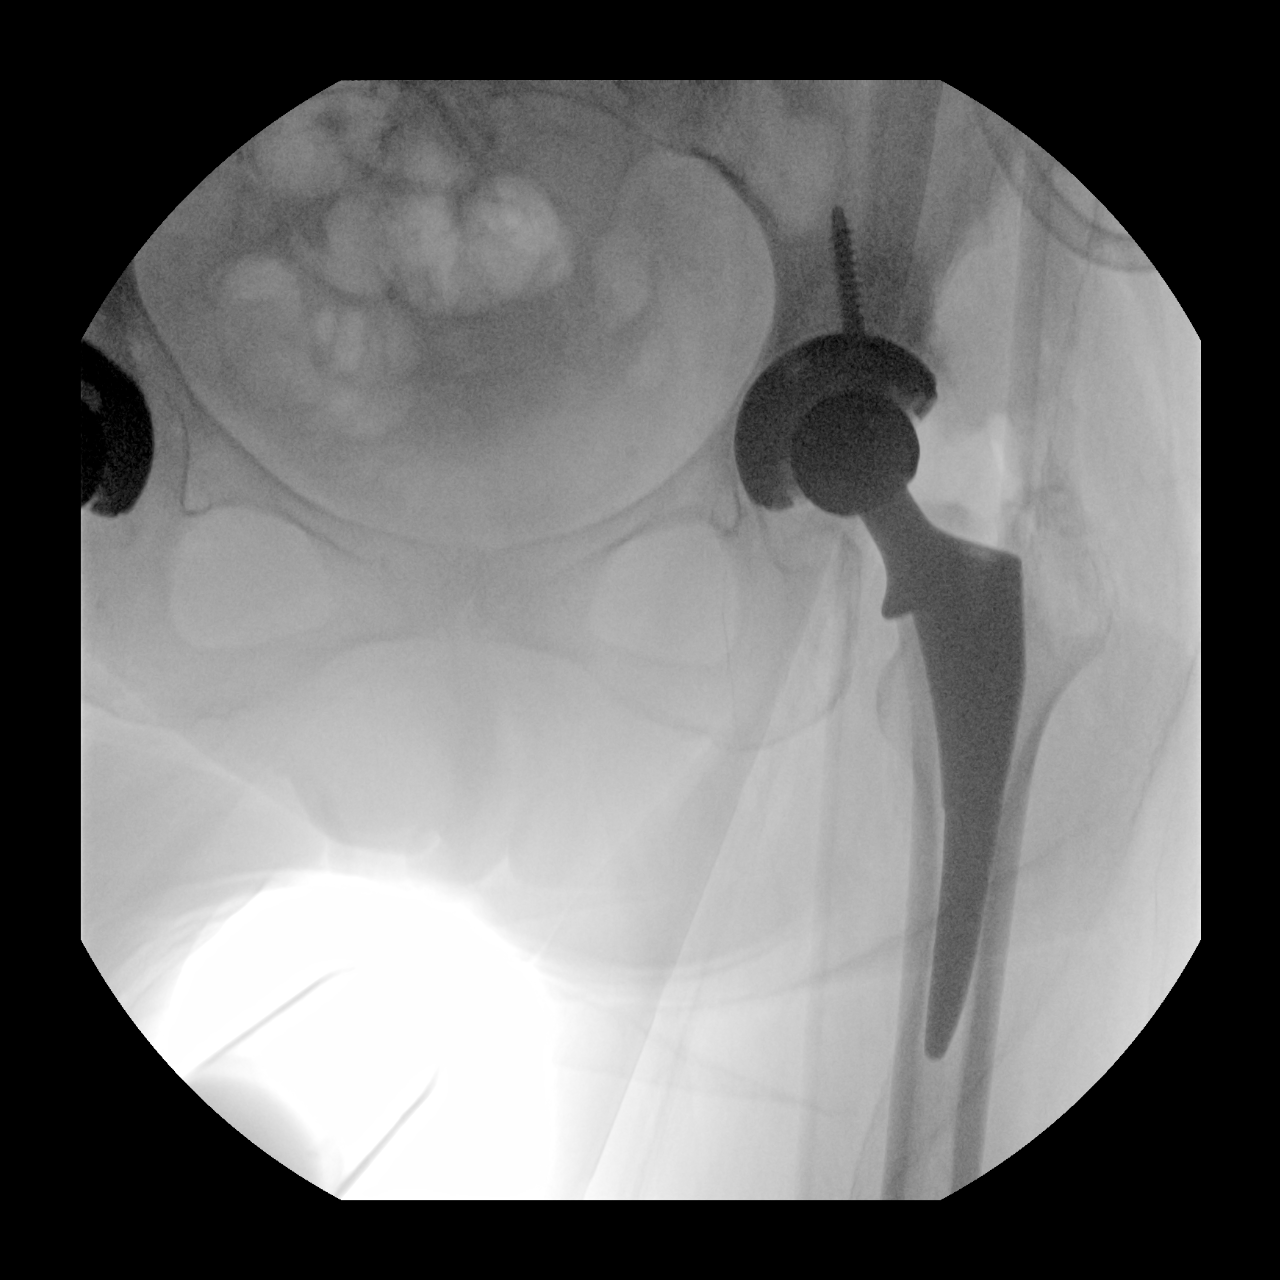
[im 2/2]
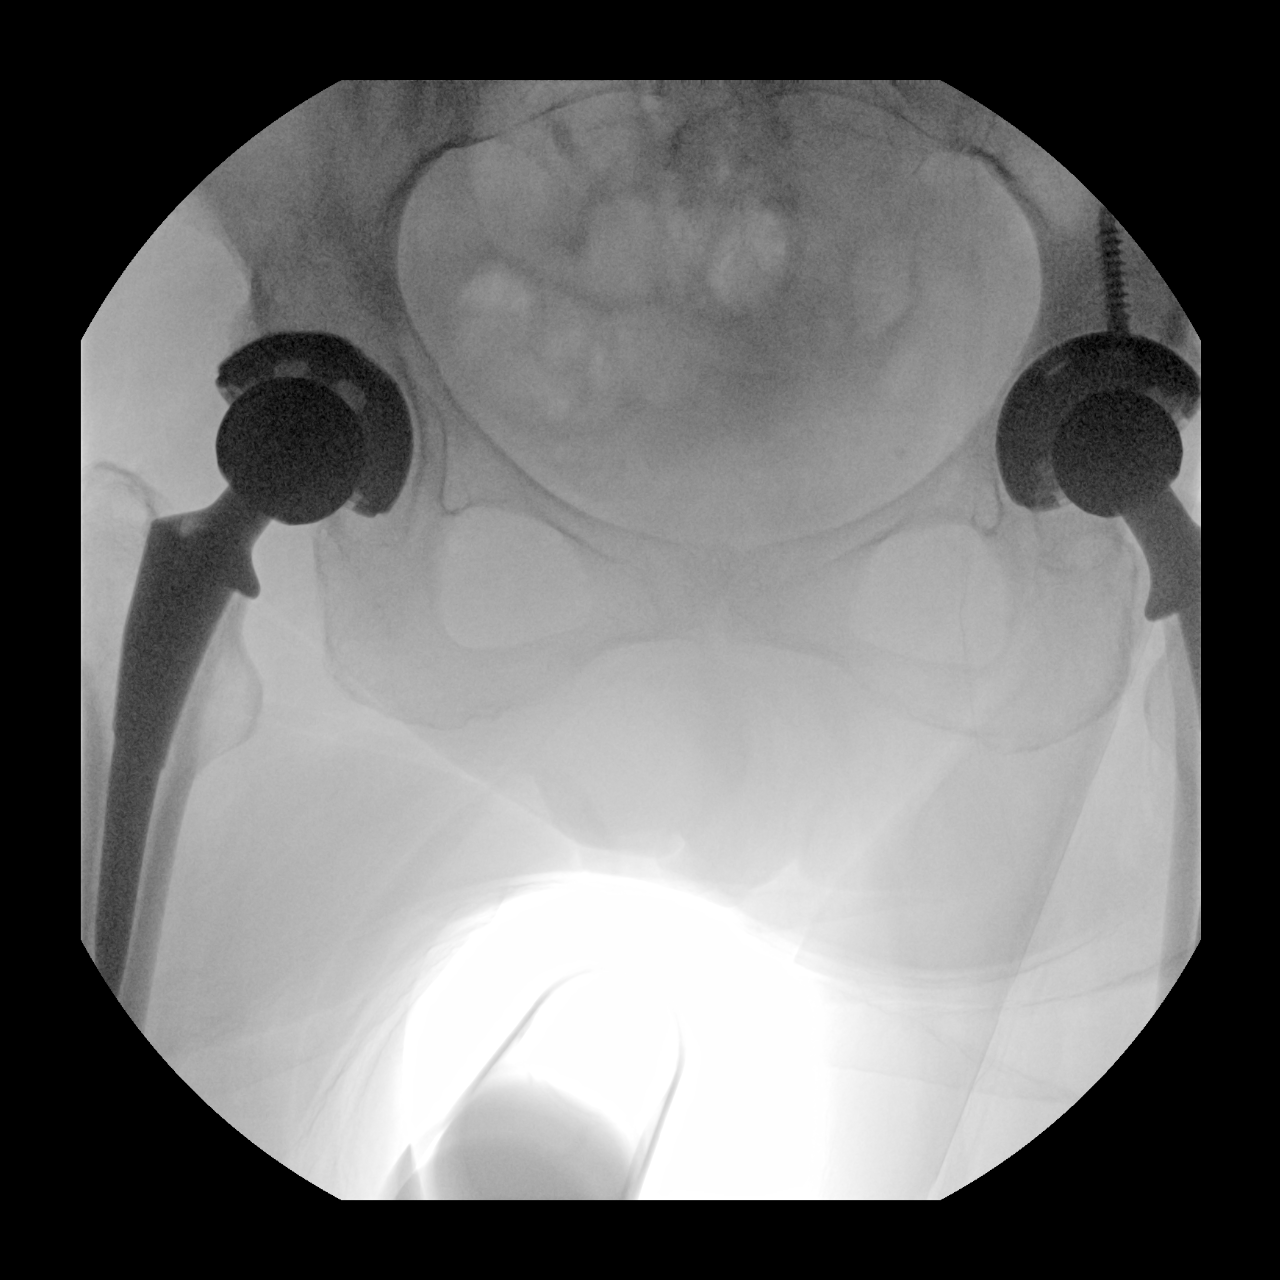

[2 of 2 positions shown; findings below may reference images not displayed]

FINDINGS: Fluoro time: 6 seconds

Operative radiographs depict interval placement of a total left hip
arthroplasty without acute complication. Expected postsurgical soft
tissue changes including soft tissue and intra-articular gas. A
right hip arthroplasty is in expected alignment as well. Remaining
portions of the bony pelvis are unremarkable.
IMPRESSION: Intraoperative fluoroscopic images during left hip arthroplasty
without acute complication.

## 2021-08-01 SURGERY — AMPUTATION, TOE
Anesthesia: Monitor Anesthesia Care | Site: Toe | Laterality: Left

## 2021-08-05 ENCOUNTER — Encounter (HOSPITAL_BASED_OUTPATIENT_CLINIC_OR_DEPARTMENT_OTHER): Payer: Self-pay | Admitting: Podiatry

## 2021-08-05 ENCOUNTER — Other Ambulatory Visit: Payer: Self-pay

## 2021-08-05 NOTE — Progress Notes (Signed)
Spoke w/ via phone for pre-op interview: patient Lab needs dos: I-Stat; per Langley Gauss, ask anesthesiologist if wants rapid drug screen; (Cocaine positive 07/02/21) Lab results: EKG 07/02/21 COVID test: patient states asymptomatic no test needed. Arrive at 12:30pm 08/08/21 NPO after MN except clear liquids.Clear liquids from MN until 11:30 am Med rec completed. Medications to take morning of surgery: synthroid only.  Diabetic medication: none AM of sx Patient instructed no nail polish to be worn day of surgery. Patient instructed to bring photo id and insurance card day of surgery. Patient aware to have Driver (ride ) / caregiver for 24 hours after surgery. Patient Special Instructions: NA Pre-Op special Istructions: have not received H&P yet; Dr. Julieanne Manson office states will fax it 02/15. Patient verbalized understanding of instructions that were given at this phone interview. Patient denies shortness of breath, chest pain, fever, cough at this phone interview.

## 2021-08-07 NOTE — Progress Notes (Signed)
H & P /medical clearance from dr Charyl Dancer sun dated 08-01-2021 received and placed on pts chart for 08-18-2021 surgery

## 2021-08-08 ENCOUNTER — Ambulatory Visit (HOSPITAL_BASED_OUTPATIENT_CLINIC_OR_DEPARTMENT_OTHER): Payer: Medicare Other | Admitting: Certified Registered Nurse Anesthetist

## 2021-08-08 ENCOUNTER — Other Ambulatory Visit: Payer: Self-pay

## 2021-08-08 ENCOUNTER — Encounter (HOSPITAL_BASED_OUTPATIENT_CLINIC_OR_DEPARTMENT_OTHER): Payer: Self-pay | Admitting: Podiatry

## 2021-08-08 ENCOUNTER — Encounter (HOSPITAL_BASED_OUTPATIENT_CLINIC_OR_DEPARTMENT_OTHER)
Admission: RE | Disposition: A | Discharge status: Home / Self Care | Payer: Self-pay | Source: Home / Self Care | Attending: Podiatry

## 2021-08-08 ENCOUNTER — Ambulatory Visit (HOSPITAL_BASED_OUTPATIENT_CLINIC_OR_DEPARTMENT_OTHER): Payer: Medicare Other

## 2021-08-08 ENCOUNTER — Ambulatory Visit (HOSPITAL_BASED_OUTPATIENT_CLINIC_OR_DEPARTMENT_OTHER)
Admission: RE | Admit: 2021-08-08 | Discharge: 2021-08-08 | Disposition: A | Discharge status: Home / Self Care | Payer: Medicare Other | Attending: Podiatry | Admitting: Podiatry

## 2021-08-08 DIAGNOSIS — L97529 Non-pressure chronic ulcer of other part of left foot with unspecified severity: Secondary | ICD-10-CM | POA: Diagnosis not present

## 2021-08-08 DIAGNOSIS — Z87891 Personal history of nicotine dependence: Secondary | ICD-10-CM | POA: Insufficient documentation

## 2021-08-08 DIAGNOSIS — M009 Pyogenic arthritis, unspecified: Secondary | ICD-10-CM

## 2021-08-08 DIAGNOSIS — M792 Neuralgia and neuritis, unspecified: Secondary | ICD-10-CM | POA: Insufficient documentation

## 2021-08-08 DIAGNOSIS — E1169 Type 2 diabetes mellitus with other specified complication: Secondary | ICD-10-CM | POA: Diagnosis not present

## 2021-08-08 DIAGNOSIS — E11621 Type 2 diabetes mellitus with foot ulcer: Secondary | ICD-10-CM | POA: Insufficient documentation

## 2021-08-08 DIAGNOSIS — M869 Osteomyelitis, unspecified: Secondary | ICD-10-CM | POA: Diagnosis not present

## 2021-08-08 DIAGNOSIS — Z01818 Encounter for other preprocedural examination: Secondary | ICD-10-CM

## 2021-08-08 HISTORY — PX: AMPUTATION TOE: SHX6595

## 2021-08-08 LAB — POCT I-STAT, CHEM 8
BUN: 27 mg/dL — ABNORMAL HIGH (ref 8–23)
Calcium, Ion: 1.28 mmol/L (ref 1.15–1.40)
Chloride: 107 mmol/L (ref 98–111)
Creatinine, Ser: 1.1 mg/dL — ABNORMAL HIGH (ref 0.44–1.00)
Glucose, Bld: 139 mg/dL — ABNORMAL HIGH (ref 70–99)
HCT: 34 % — ABNORMAL LOW (ref 36.0–46.0)
Hemoglobin: 11.6 g/dL — ABNORMAL LOW (ref 12.0–15.0)
Potassium: 4.1 mmol/L (ref 3.5–5.1)
Sodium: 142 mmol/L (ref 135–145)
TCO2: 24 mmol/L (ref 22–32)

## 2021-08-08 LAB — GLUCOSE, CAPILLARY: Glucose-Capillary: 92 mg/dL (ref 70–99)

## 2021-08-08 SURGERY — AMPUTATION, TOE
Anesthesia: Monitor Anesthesia Care | Site: Toe | Laterality: Left

## 2021-08-08 MED ORDER — PROPOFOL 1000 MG/100ML IV EMUL
INTRAVENOUS | Status: AC
  Filled 2021-08-08: qty 200

## 2021-08-08 MED ORDER — FENTANYL CITRATE (PF) 100 MCG/2ML IJ SOLN: 25.0000 ug | INTRAMUSCULAR | Status: DC | PRN

## 2021-08-08 MED ORDER — EPHEDRINE SULFATE-NACL 50-0.9 MG/10ML-% IV SOSY
PREFILLED_SYRINGE | INTRAVENOUS | Status: DC | PRN
  Administered 2021-08-08: 15 mg via INTRAVENOUS
  Administered 2021-08-08: 10 mg via INTRAVENOUS

## 2021-08-08 MED ORDER — LACTATED RINGERS IV SOLN: INTRAVENOUS | Status: DC

## 2021-08-08 MED ORDER — CEFAZOLIN SODIUM 1 G IJ SOLR
INTRAMUSCULAR | Status: AC
  Filled 2021-08-08: qty 20

## 2021-08-08 MED ORDER — EPHEDRINE 5 MG/ML INJ
INTRAVENOUS | Status: AC
  Filled 2021-08-08: qty 5

## 2021-08-08 MED ORDER — LIDOCAINE HCL 2 % IJ SOLN
INTRAMUSCULAR | Status: DC | PRN
  Administered 2021-08-08: 10 mL

## 2021-08-08 MED ORDER — KETOROLAC TROMETHAMINE 30 MG/ML IJ SOLN: 30.0000 mg | Freq: Once | INTRAMUSCULAR | Status: DC | PRN

## 2021-08-08 MED ORDER — FENTANYL CITRATE (PF) 100 MCG/2ML IJ SOLN
INTRAMUSCULAR | Status: DC | PRN
  Administered 2021-08-08 (×2): 50 ug via INTRAVENOUS

## 2021-08-08 MED ORDER — ONDANSETRON HCL 4 MG/2ML IJ SOLN
INTRAMUSCULAR | Status: DC | PRN
  Administered 2021-08-08: 4 mg via INTRAVENOUS

## 2021-08-08 MED ORDER — PROPOFOL 10 MG/ML IV BOLUS
INTRAVENOUS | Status: DC | PRN
Start: 2021-08-08 — End: 2021-08-08
  Administered 2021-08-08: 30 mg via INTRAVENOUS

## 2021-08-08 MED ORDER — PROPOFOL 500 MG/50ML IV EMUL
INTRAVENOUS | Status: DC | PRN
  Administered 2021-08-08: 100 ug/kg/min via INTRAVENOUS

## 2021-08-08 MED ORDER — ONDANSETRON HCL 4 MG/2ML IJ SOLN: 4.0000 mg | Freq: Once | INTRAMUSCULAR | Status: DC | PRN

## 2021-08-08 MED ORDER — FENTANYL CITRATE (PF) 100 MCG/2ML IJ SOLN
INTRAMUSCULAR | Status: AC
  Filled 2021-08-08: qty 2

## 2021-08-08 MED ORDER — OXYCODONE HCL 5 MG/5ML PO SOLN: 5.0000 mg | Freq: Once | ORAL | Status: DC | PRN

## 2021-08-08 MED ORDER — OXYCODONE HCL 5 MG PO TABS: 5.0000 mg | ORAL_TABLET | Freq: Once | ORAL | Status: DC | PRN

## 2021-08-08 MED ORDER — BUPIVACAINE HCL 0.5 % IJ SOLN
INTRAMUSCULAR | Status: DC | PRN
Start: 2021-08-08 — End: 2021-08-08
  Administered 2021-08-08: 10 mL

## 2021-08-08 MED ORDER — MIDAZOLAM HCL 5 MG/5ML IJ SOLN
INTRAMUSCULAR | Status: DC | PRN
  Administered 2021-08-08: 2 mg via INTRAVENOUS

## 2021-08-08 MED ORDER — PHENYLEPHRINE 40 MCG/ML (10ML) SYRINGE FOR IV PUSH (FOR BLOOD PRESSURE SUPPORT)
PREFILLED_SYRINGE | INTRAVENOUS | Status: DC | PRN
  Administered 2021-08-08 (×2): 120 ug via INTRAVENOUS
  Administered 2021-08-08: 80 ug via INTRAVENOUS
  Administered 2021-08-08: 120 ug via INTRAVENOUS
  Administered 2021-08-08: 160 ug via INTRAVENOUS

## 2021-08-08 MED ORDER — PROPOFOL 10 MG/ML IV BOLUS
INTRAVENOUS | Status: AC
  Filled 2021-08-08: qty 20

## 2021-08-08 MED ORDER — MIDAZOLAM HCL 2 MG/2ML IJ SOLN
INTRAMUSCULAR | Status: AC
  Filled 2021-08-08: qty 2

## 2021-08-08 MED ORDER — PHENYLEPHRINE 40 MCG/ML (10ML) SYRINGE FOR IV PUSH (FOR BLOOD PRESSURE SUPPORT)
PREFILLED_SYRINGE | INTRAVENOUS | Status: AC
  Filled 2021-08-08: qty 10

## 2021-08-08 MED ORDER — ONDANSETRON HCL 4 MG/2ML IJ SOLN
INTRAMUSCULAR | Status: AC
Start: 2021-08-08 — End: ?
  Filled 2021-08-08: qty 2

## 2021-08-08 SURGICAL SUPPLY — 73 items
APL SKNCLS STERI-STRIP NONHPOA (GAUZE/BANDAGES/DRESSINGS)
BAG DECANTER FOR FLEXI CONT (MISCELLANEOUS) IMPLANT
BENZOIN TINCTURE PRP APPL 2/3 (GAUZE/BANDAGES/DRESSINGS) ×1 IMPLANT
BLADE AVERAGE 25X9 (BLADE) IMPLANT
BLADE OSC/SAG .038X5.5 CUT EDG (BLADE) IMPLANT
BLADE OSC/SAGITTAL MD 5.5X18 (BLADE) IMPLANT
BLADE OSCIL/SAGITTAL W/10 ST (BLADE) IMPLANT
BLADE SURG 15 STRL LF DISP TIS (BLADE) ×3 IMPLANT
BLADE SURG 15 STRL SS (BLADE) ×6
BNDG CMPR 9X4 STRL LF SNTH (GAUZE/BANDAGES/DRESSINGS) ×1
BNDG COHESIVE 2X5 TAN ST LF (GAUZE/BANDAGES/DRESSINGS) ×1 IMPLANT
BNDG CONFORM 2 STRL LF (GAUZE/BANDAGES/DRESSINGS) ×1 IMPLANT
BNDG ELASTIC 3X5.8 VLCR STR LF (GAUZE/BANDAGES/DRESSINGS) ×1 IMPLANT
BNDG ELASTIC 4X5.8 VLCR STR LF (GAUZE/BANDAGES/DRESSINGS) ×1 IMPLANT
BNDG ELASTIC 6X5.8 VLCR STR LF (GAUZE/BANDAGES/DRESSINGS) IMPLANT
BNDG ESMARK 4X9 LF (GAUZE/BANDAGES/DRESSINGS) ×2 IMPLANT
BNDG PLASTER X FAST 4X5 WHT LF (CAST SUPPLIES) IMPLANT
BNDG PLSTR 5X4 XFST ST WHT LF (CAST SUPPLIES)
COVER BACK TABLE 60X90IN (DRAPES) ×2 IMPLANT
COVER MAYO STAND STRL (DRAPES) ×2 IMPLANT
CUFF TOURN SGL QUICK 18X4 (TOURNIQUET CUFF) ×2 IMPLANT
DECANTER SPIKE VIAL GLASS SM (MISCELLANEOUS) IMPLANT
DRAPE EXTREMITY T 121X128X90 (DISPOSABLE) ×2 IMPLANT
DRAPE SHEET LG 3/4 BI-LAMINATE (DRAPES) ×2 IMPLANT
DRAPE SURG 17X23 STRL (DRAPES) ×1 IMPLANT
DRSG ADAPTIC 3X8 NADH LF (GAUZE/BANDAGES/DRESSINGS) ×1 IMPLANT
DRSG EMULSION OIL 3X3 NADH (GAUZE/BANDAGES/DRESSINGS) ×1 IMPLANT
DURAPREP 26ML APPLICATOR (WOUND CARE) ×2 IMPLANT
ELECT REM PT RETURN 9FT ADLT (ELECTROSURGICAL) ×2
ELECTRODE REM PT RTRN 9FT ADLT (ELECTROSURGICAL) ×1 IMPLANT
GAUZE 4X4 16PLY ~~LOC~~+RFID DBL (SPONGE) ×2 IMPLANT
GAUZE PACKING 1/4 X5 YD (GAUZE/BANDAGES/DRESSINGS) ×1 IMPLANT
GAUZE SPONGE 4X4 12PLY STRL (GAUZE/BANDAGES/DRESSINGS) ×3 IMPLANT
GAUZE XEROFORM 1X8 LF (GAUZE/BANDAGES/DRESSINGS) ×1 IMPLANT
GLOVE SURG ENC MOIS LTX SZ7.5 (GLOVE) ×2 IMPLANT
GOWN STRL REUS W/TWL LRG LVL3 (GOWN DISPOSABLE) ×2 IMPLANT
IV NS IRRIG 3000ML ARTHROMATIC (IV SOLUTION) ×1 IMPLANT
KIT TURNOVER CYSTO (KITS) ×2 IMPLANT
LOOP VESSEL MAXI BLUE (MISCELLANEOUS) IMPLANT
NDL SAFETY ECLIPSE 18X1.5 (NEEDLE) ×1 IMPLANT
NEEDLE HYPO 18GX1.5 SHARP (NEEDLE) ×2
NEEDLE HYPO 22GX1.5 SAFETY (NEEDLE) ×2 IMPLANT
NS IRRIG 500ML POUR BTL (IV SOLUTION) ×2 IMPLANT
PACK BASIN DAY SURGERY FS (CUSTOM PROCEDURE TRAY) ×2 IMPLANT
PAD CAST CTTN 4X4 STRL (SOFTGOODS) IMPLANT
PADDING CAST ABS 3INX4YD NS (CAST SUPPLIES) ×3
PADDING CAST ABS 4INX4YD NS (CAST SUPPLIES) ×1
PADDING CAST ABS COTTON 3X4 (CAST SUPPLIES) IMPLANT
PADDING CAST ABS COTTON 4X4 ST (CAST SUPPLIES) ×1 IMPLANT
PADDING CAST COTTON 4X4 STRL (SOFTGOODS) ×2
PENCIL SMOKE EVACUATOR (MISCELLANEOUS) ×2 IMPLANT
SET IRRIG Y TYPE TUR BLADDER L (SET/KITS/TRAYS/PACK) ×1 IMPLANT
SPONGE T-LAP 4X18 ~~LOC~~+RFID (SPONGE) ×1 IMPLANT
STAPLER VISISTAT (STAPLE) ×1 IMPLANT
STOCKINETTE 6  STRL (DRAPES)
STOCKINETTE 6 STRL (DRAPES) ×1 IMPLANT
STRIP CLOSURE SKIN 1/4X4 (GAUZE/BANDAGES/DRESSINGS) IMPLANT
SUCTION FRAZIER HANDLE 10FR (MISCELLANEOUS) ×2
SUCTION TUBE FRAZIER 10FR DISP (MISCELLANEOUS) ×1 IMPLANT
SUT ETHILON 3 0 PS 1 (SUTURE) ×1 IMPLANT
SUT ETHILON 4 0 PS 2 18 (SUTURE) IMPLANT
SUT ETHILON 5 0 PS 2 18 (SUTURE) IMPLANT
SUT MNCRL AB 4-0 PS2 18 (SUTURE) IMPLANT
SUT VIC AB 3-0 SH 27 (SUTURE)
SUT VIC AB 3-0 SH 27X BRD (SUTURE) IMPLANT
SUT VIC AB 4-0 PS2 18 (SUTURE) IMPLANT
SWAB CULTURE ESWAB REG 1ML (MISCELLANEOUS) ×2 IMPLANT
SYR 3ML 18GX1 1/2 (SYRINGE) ×1 IMPLANT
SYR BULB EAR ULCER 3OZ GRN STR (SYRINGE) ×2 IMPLANT
SYR CONTROL 10ML LL (SYRINGE) ×4 IMPLANT
TUBE CONNECTING 12X1/4 (SUCTIONS) ×2 IMPLANT
UNDERPAD 30X36 HEAVY ABSORB (UNDERPADS AND DIAPERS) ×2 IMPLANT
WATER STERILE IRR 500ML POUR (IV SOLUTION) ×1 IMPLANT

## 2021-08-08 NOTE — Transfer of Care (Signed)
Immediate Anesthesia Transfer of Care Note  Patient: Taylor Benitez  Procedure(s) Performed: AMPUTATION LEFT FIRST TOE (Left: Toe)  Patient Location: PACU  Anesthesia Type:MAC  Level of Consciousness: awake, alert  and oriented  Airway & Oxygen Therapy: Patient Spontanous Breathing and Patient connected to face mask oxygen  Post-op Assessment: Report given to RN and Post -op Vital signs reviewed and stable  Post vital signs: Reviewed and stable  Last Vitals:  Vitals Value Taken Time  BP 135/120 08/08/21 1505  Temp    Pulse 78 08/08/21 1506  Resp 13 08/08/21 1506  SpO2 100 % 08/08/21 1506  Vitals shown include unvalidated device data.  Last Pain:  Vitals:   08/08/21 1233  TempSrc: Oral  PainSc: 4       Patients Stated Pain Goal: 4 (38/87/19 5974)  Complications: No notable events documented.

## 2021-08-08 NOTE — Discharge Instructions (Addendum)
Patient to weight bear as tolerated; Try to put most of the weight on the heel.  Patient should keep dressing clean and dry. Cal lDr. Waldron Session if dressing gets wet or soiled.   Keep extremity elevated; ice is not necessary.   Follow up with Dr. Waldron Session in one week. Call and make appointment.   Wear ortho shoe at all times while up  [place] Instructions  Activity: Get plenty of rest for the remainder of the day. A responsible individual must stay with you for 24 hours following the procedure.  For the next 24 hours, DO NOT: -Drive a car -Paediatric nurse -Drink alcoholic beverages -Take any medication unless instructed by your physician -Make any legal decisions or sign important papers.  Meals: Start with liquid foods such as gelatin or soup. Progress to regular foods as tolerated. Avoid greasy, spicy, heavy foods. If nausea and/or vomiting occur, drink only clear liquids until the nausea and/or vomiting subsides. Call your physician if vomiting continues.  Special Instructions/Symptoms: Your throat may feel dry or sore from the anesthesia or the breathing tube placed in your throat during surgery. If this causes discomfort, gargle with warm salt water. The discomfort should disappear within 24 hours.

## 2021-08-08 NOTE — Progress Notes (Signed)
Orthopedic Tech Progress Note Patient Details:  Taylor Benitez May 30, 1961 [id number]  Patient ID: [name], female   DOB: 02-23-1961, 61 y.o.   MRN: [id number]  [name] 08/08/2021, 4:07 PM Post op shoe applied to left foot at [facility]

## 2021-08-08 NOTE — Anesthesia Preprocedure Evaluation (Signed)
Anesthesia Evaluation  Patient identified by MRN, date of birth, ID band Patient awake    Reviewed: Allergy & Precautions, NPO status , Patient's Chart, lab work & pertinent test results  Airway Mallampati: II  TM Distance: >3 FB Neck ROM: Full    Dental no notable dental hx.    Pulmonary neg pulmonary ROS, former smoker,    Pulmonary exam normal breath sounds clear to auscultation       Cardiovascular hypertension, Normal cardiovascular exam Rhythm:Regular Rate:Normal     Neuro/Psych negative neurological ROS  negative psych ROS   GI/Hepatic negative GI ROS, (+) Cirrhosis     substance abuse  cocaine use,   Endo/Other  diabetesHypothyroidism   Renal/GU negative Renal ROS  negative genitourinary   Musculoskeletal  (+) Arthritis , Rheumatoid disorders,    Abdominal   Peds negative pediatric ROS (+)  Hematology negative hematology ROS (+)   Anesthesia Other Findings   Reproductive/Obstetrics negative OB ROS                             Anesthesia Physical Anesthesia Plan  ASA: 3  Anesthesia Plan: MAC   Post-op Pain Management: Minimal or no pain anticipated   Induction: Intravenous  PONV Risk Score and Plan: 2 and Ondansetron, Propofol infusion and Treatment may vary due to age or medical condition  Airway Management Planned: Simple Face Mask  Additional Equipment:   Intra-op Plan:   Post-operative Plan:   Informed Consent: I have reviewed the patients History and Physical, chart, labs and discussed the procedure including the risks, benefits and alternatives for the proposed anesthesia with the patient or authorized representative who has indicated his/her understanding and acceptance.     Dental advisory given  Plan Discussed with: CRNA and Surgeon  Anesthesia Plan Comments:         Anesthesia Quick Evaluation

## 2021-08-08 NOTE — H&P (Signed)
History and Physical Interval Note:  08/08/2021 1:41 PM Taylor Benitez  has presented today for surgery, with the diagnosis of osteomyelitis of left first metatarsal and infection of the left great toe.  The various methods of treatment have been discussed with the patient and family. After consideration of risks, benefits and other options for treatment, the patient has consented to   Procedure(s) with comments: AMPUTATION LEFT FIRST TOE (Left) - WITH ANKLE BLOCK as a surgical intervention.  The patient's history has been reviewed, patient examined, no change in status, stable for surgery.  I have reviewed the patient's chart and labs.  Questions were answered to the patient's satisfaction.     Taylor Benitez

## 2021-08-08 NOTE — Op Note (Signed)
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 Left Foot partial first ray amputation  Surgeon: Hinton Rao, DPM  Anesthesia: 50-50 mixture of 2% lidocaine plain with 0.5% Marcaine plain totaling 20 cc infiltrated in the patient's Left lower extremity in an ankle block fashion  Hemostasis: Ankle tourniquet deferred due to short nature of case  EBL: 50 mL Materials: None Injectables: None Pathology: Gross specimen left First ray. Infection culture left foot. Clean bone culture of margin.  Condition: The patient tolerated the procedure and anesthesia well. No complications noted or reported   Justification for procedure: The patient is a 61 y.o. female who presents today for surgical correction of left first ray infection. All conservative modalities of been unsuccessful in providing any sort of satisfactory alleviation of symptoms with the patient. The patient was identified preoperatively and the correct operative extremity was marked by myself. Patient was educated pre-operatively on risks, complications, and alternatives. The patient consented for surgical correction. The patient consent form was reviewed. All patient questions were answered. No guarantees were expressed or implied. The patient and the surgeon both signed the patient consent form with the witness present and placed in the patient's chart.   Procedure in Detail: The patient was brought to the operating room, placed on the operating table in a supine position and anesthesia was induced. The left lower extremity was prepped and draped in a standard sterile fashion and a timeout was performed to verify the correct operative site. Attention was  then directed to the surgical area where procedure number one commenced.  Procedure #1: Amputation of Ray CPT [id number]  Attention was directed to the left first metatarsal where a medially based raquet type incision was made encompassing the great toe.  Incision was deepened down to bone and first metatarsal was identified.  Metatarsal head noted to have erosions and pathologic fracture. Purulence noted within the first metatarsal phalangeal joint. A culture of this purulent discharge was obtained.  Sagittal saw was utilized to resect bone proximally at area of hard bone.  Infected portion of metatarsal bone and remnant of the hallux proximal and hallux distal phalanx was removed and was passed off to back table to be sent to Pathology for further evaluation. The sesamoid aparatus was then identified and excised from the wound to facilitate an appropriate weight bearing stump.  Any necrotic tissue and residual bone was removed to healthy bleeding tissues.  The area was irrigated with 3L  of normal sterile saline with cysto tubing irrigation.  The flexor tendons were identified and excised from the wound. Any bleeders noted were cauterized as necessary.  Residual first metatarsal bone culture was sent to microbiology.    The skin and subcutaneous tissues were then reapproximated and coapted utilizing alternating 3-0 nylon vertical mattress sutures and staples. The incision was then covered with Adaptic, 4 x 4 gauze and a light application of cast padding and Ace wrap.   The patient was transferred to the recovery room with all vital signs stable and vascular status intact to the foot.  After short-term of postoperative monitoring, the patient will be discharged home.  Should attempt to be heel weight bearing as much as possible if safe.  Verbal as well as written instructions were provided  for the patient regarding wound care. The patient is to keep the dressings clean dry and intact until they are to follow  surgeon Dr. Hinton Raolark Espen Bethel, DPM in the office upon discharge. Follow up in 1 week with myself. Will plan for suture removal in 3-4 weeks. Patient will continue to take doxycycline antibiotic daily pending culture results.  Hinton Raolark Tamiki Kuba DPM

## 2021-08-09 ENCOUNTER — Encounter (HOSPITAL_BASED_OUTPATIENT_CLINIC_OR_DEPARTMENT_OTHER): Payer: Self-pay | Admitting: Podiatry

## 2021-08-09 MED ORDER — 0.9 % SODIUM CHLORIDE (POUR BTL) OPTIME
TOPICAL | Status: DC | PRN
  Administered 2021-08-08: 500 mL

## 2021-08-09 NOTE — Anesthesia Postprocedure Evaluation (Signed)
Anesthesia Post Note  Patient: Taylor Benitez  Procedure(s) Performed: AMPUTATION LEFT FIRST TOE (Left: Toe)     Patient location during evaluation: PACU Anesthesia Type: MAC Level of consciousness: awake and alert Pain management: pain level controlled Vital Signs Assessment: post-procedure vital signs reviewed and stable Respiratory status: spontaneous breathing, nonlabored ventilation, respiratory function stable and patient connected to nasal cannula oxygen Cardiovascular status: stable and blood pressure returned to baseline Postop Assessment: no apparent nausea or vomiting Anesthetic complications: no   No notable events documented.  Last Vitals:  Vitals:   08/08/21 1600 08/08/21 1621  BP: 113/72 122/73  Pulse: 78 81  Resp: (!) 21 (!) 24  Temp: 36.6 C 36.8 C  SpO2: 98% 99%    Last Pain:  Vitals:   08/08/21 1622  TempSrc:   PainSc: 0-No pain                 Catherina Pates S

## 2021-08-12 LAB — SURGICAL PATHOLOGY

## 2021-08-13 LAB — AEROBIC/ANAEROBIC CULTURE W GRAM STAIN (SURGICAL/DEEP WOUND)
Culture: NO GROWTH
Gram Stain: NONE SEEN

## 2021-09-09 LAB — FUNGUS CULTURE RESULT

## 2021-09-09 LAB — FUNGUS CULTURE WITH STAIN

## 2021-09-09 LAB — FUNGAL ORGANISM REFLEX

## 2021-09-10 LAB — FUNGUS CULTURE WITH STAIN

## 2021-09-10 LAB — FUNGUS CULTURE RESULT

## 2021-09-10 LAB — FUNGAL ORGANISM REFLEX

## 2021-09-12 ENCOUNTER — Ambulatory Visit: Payer: Medicare Other | Admitting: Neurology

## 2021-09-12 ENCOUNTER — Encounter: Payer: Self-pay | Admitting: Neurology

## 2021-12-31 ENCOUNTER — Emergency Department (HOSPITAL_COMMUNITY)
Admission: EM | Admit: 2021-12-31 | Discharge: 2022-01-01 | Disposition: A | Discharge status: Home / Self Care | Payer: Medicare Other | Attending: Emergency Medicine | Admitting: Emergency Medicine

## 2021-12-31 ENCOUNTER — Encounter (HOSPITAL_COMMUNITY): Payer: Self-pay | Admitting: Emergency Medicine

## 2021-12-31 ENCOUNTER — Other Ambulatory Visit: Payer: Self-pay

## 2021-12-31 DIAGNOSIS — Z79899 Other long term (current) drug therapy: Secondary | ICD-10-CM | POA: Insufficient documentation

## 2021-12-31 DIAGNOSIS — G929 Unspecified toxic encephalopathy: Secondary | ICD-10-CM | POA: Insufficient documentation

## 2021-12-31 DIAGNOSIS — R079 Chest pain, unspecified: Secondary | ICD-10-CM | POA: Diagnosis not present

## 2021-12-31 DIAGNOSIS — R464 Slowness and poor responsiveness: Secondary | ICD-10-CM | POA: Insufficient documentation

## 2021-12-31 LAB — COMPREHENSIVE METABOLIC PANEL
ALT: 14 U/L (ref 0–44)
AST: 14 U/L — ABNORMAL LOW (ref 15–41)
Albumin: 3.6 g/dL (ref 3.5–5.0)
Alkaline Phosphatase: 67 U/L (ref 38–126)
Anion gap: 6 (ref 5–15)
BUN: 19 mg/dL (ref 8–23)
CO2: 24 mmol/L (ref 22–32)
Calcium: 9.2 mg/dL (ref 8.9–10.3)
Chloride: 112 mmol/L — ABNORMAL HIGH (ref 98–111)
Creatinine, Ser: 0.96 mg/dL (ref 0.44–1.00)
GFR, Estimated: >60 mL/min (ref >60)
Glucose, Bld: 103 mg/dL — ABNORMAL HIGH (ref 70–99)
Potassium: 3.4 mmol/L — ABNORMAL LOW (ref 3.5–5.1)
Sodium: 142 mmol/L (ref 135–145)
Total Bilirubin: 0.5 mg/dL (ref 0.3–1.2)
Total Protein: 6.7 g/dL (ref 6.5–8.1)

## 2021-12-31 LAB — CBC WITH DIFFERENTIAL/PLATELET
Abs Immature Granulocytes: 0.02 10*3/uL (ref 0.00–0.07)
Basophils Absolute: 0 10*3/uL (ref 0.0–0.1)
Basophils Relative: 0 %
Eosinophils Absolute: 0.1 10*3/uL (ref 0.0–0.5)
Eosinophils Relative: 1 %
HCT: 32.9 % — ABNORMAL LOW (ref 36.0–46.0)
Hemoglobin: 10.7 g/dL — ABNORMAL LOW (ref 12.0–15.0)
Immature Granulocytes: 0 %
Lymphocytes Relative: 36 %
Lymphs Abs: 2 10*3/uL (ref 0.7–4.0)
MCH: 27.5 pg (ref 26.0–34.0)
MCHC: 32.5 g/dL (ref 30.0–36.0)
MCV: 84.6 fL (ref 80.0–100.0)
Monocytes Absolute: 0.4 10*3/uL (ref 0.1–1.0)
Monocytes Relative: 8 %
Neutro Abs: 3 10*3/uL (ref 1.7–7.7)
Neutrophils Relative %: 55 %
Platelets: 156 10*3/uL (ref 150–400)
RBC: 3.89 MIL/uL (ref 3.87–5.11)
RDW: 18.1 % — ABNORMAL HIGH (ref 11.5–15.5)
WBC: 5.5 10*3/uL (ref 4.0–10.5)
nRBC: 0 % (ref 0.0–0.2)

## 2021-12-31 LAB — BLOOD GAS, VENOUS
Acid-base deficit: 1.4 mmol/L (ref 0.0–2.0)
Bicarbonate: 25.2 mmol/L (ref 20.0–28.0)
O2 Saturation: 40.7 %
Patient temperature: 37
pCO2, Ven: 50 mmHg (ref 44–60)
pH, Ven: 7.31 (ref 7.25–7.43)
pO2, Ven: <31 mmHg — CL (ref 32–45)

## 2021-12-31 LAB — RAPID URINE DRUG SCREEN, HOSP PERFORMED
Amphetamines: NOT DETECTED
Barbiturates: NOT DETECTED
Benzodiazepines: NOT DETECTED
Cocaine: NOT DETECTED
Opiates: NOT DETECTED
Tetrahydrocannabinol: POSITIVE — AB

## 2021-12-31 LAB — ETHANOL: Alcohol, Ethyl (B): <10 mg/dL (ref <10)

## 2021-12-31 LAB — LACTIC ACID, PLASMA: Lactic Acid, Venous: 1.6 mmol/L (ref 0.5–1.9)

## 2021-12-31 LAB — PROTIME-INR
INR: 1.1 (ref 0.8–1.2)
Prothrombin Time: 14.4 seconds (ref 11.4–15.2)

## 2021-12-31 NOTE — ED Triage Notes (Signed)
BIBA  Per EMS: Pt coming from home w/ c/o drug use. Responsive to painful stimuli upon EMS arrival.  Admits to ETOH, coke, THC VSS once awake  138/76 84 HR  100 RA 117 CBG  14 RR

## 2022-01-01 NOTE — Discharge Instructions (Signed)
You were seen today after being altered.  This is likely related to alcohol and drug use.  You should avoid illicit drug and alcohol use.

## 2022-01-01 NOTE — ED Provider Notes (Signed)
[facility]-EMERGENCY DEPT Provider Note   CSN: [id number] Arrival date & time: 12/31/21  1855     History  No chief complaint on file.   NIHITHA Benitez is a 61 y.o. female.  HPI     61 year old female comes in with chief complaint of unresponsiveness  Patient comes from home, via EMS.  Per EMS, patient admitted to cocaine, alcohol and marijuana use to them.  She had not received any Narcan.  Patient is responding to noxious stimuli.  Upon sternal rub, she wakes up.  She states that she did not use any alcohol or drugs.  She admits to marijuana use.  I called patient's emergency line and was able to get in touch with her brother.  He does not know about patient being here, but states that she lives with a roommate and likely the roommate called 911.  He also went on to voluntarily state that patient has history of abusing pills.  Home Medications Prior to Admission medications   Medication Sig Start Date End Date Taking? Authorizing Provider  amitriptyline (ELAVIL) 100 MG tablet TAKE 2 TABLETS BY MOUTH AT BEDTIME Patient taking differently: Take 200 mg by mouth. 06/14/21   Tower, Audrie Gallus, MD  baclofen (LIORESAL) 10 MG tablet Take 10 mg by mouth 2 (two) times daily as needed for muscle spasms.    [provider]  celecoxib (CELEBREX) 100 MG capsule Take 100 mg by mouth 2 (two) times daily.    [provider]  Cholecalciferol (VITAMIN D3) 1.25 MG (50000 UT) TABS Take 1 tablet by mouth every 7 (seven) days. Patient taking differently: Take 1 tablet by mouth every 7 (seven) days. Monday's 12/19/19   Emi Belfast, FNP  empagliflozin (JARDIANCE) 25 MG TABS tablet Take 25 mg by mouth daily.    [provider]  hydrOXYzine (ATARAX/VISTARIL) 25 MG tablet TAKE 1/2 TO 1 TABLET BY MOUTH EVERY 8 HOURS AS NEEDED FOR ANXETY Patient not taking: Reported on 07/30/2021 10/18/20   Tower, Audrie Gallus, MD  levothyroxine (SYNTHROID) 150 MCG tablet Take 1  tablet (150 mcg total) by mouth daily. Patient taking differently: Take 150 mcg by mouth daily. 08/09/20   Tower, Audrie Gallus, MD  metFORMIN (GLUMETZA) 1000 MG (MOD) 24 hr tablet Take 1,000 mg by mouth 2 (two) times daily with a meal.    [provider]  Multiple Vitamins-Minerals (MULTIVITAMIN WOMEN PO) Take by mouth daily.    [provider]  oxyCODONE (OXY IR/ROXICODONE) 5 MG immediate release tablet Take 5 mg by mouth 3 (three) times daily as needed for severe pain.    [provider]      Allergies    Glipizide, Metformin and related, and Penicillins    Review of Systems   Review of Systems  All other systems reviewed and are negative.   Physical Exam Updated Vital Signs BP (!) 110/59   Pulse 66   Temp 97.6 F (36.4 C) (Oral)   Resp 12   SpO2 98%  Physical Exam Vitals and nursing note reviewed.  Constitutional:      Appearance: She is well-developed.  HENT:     Head: Atraumatic.  Cardiovascular:     Rate and Rhythm: Normal rate.  Pulmonary:     Effort: Pulmonary effort is normal.  Musculoskeletal:     Cervical back: Normal range of motion and neck supple.  Skin:    General: Skin is warm and dry.  Neurological:     Mental  Status: She is alert and oriented to person, place, and time.     ED Results / Procedures / Treatments   Labs (all labs ordered are listed, but only abnormal results are displayed) Labs Reviewed  COMPREHENSIVE METABOLIC PANEL - Abnormal; Notable for the following components:      Result Value   Potassium 3.4 (*)    Chloride 112 (*)    Glucose, Bld 103 (*)    AST 14 (*)    All other components within normal limits  CBC WITH DIFFERENTIAL/PLATELET - Abnormal; Notable for the following components:   Hemoglobin 10.7 (*)    HCT 32.9 (*)    RDW 18.1 (*)    All other components within normal limits  BLOOD GAS, VENOUS - Abnormal; Notable for the following components:   pO2, Ven <31 (*)    All other components within normal  limits  RAPID URINE DRUG SCREEN, HOSP PERFORMED - Abnormal; Notable for the following components:   Tetrahydrocannabinol POSITIVE (*)    All other components within normal limits  PROTIME-INR  LACTIC ACID, PLASMA  ETHANOL  LACTIC ACID, PLASMA    EKG EKG Interpretation  Date/Time:  Tuesday December 31 2021 21:12:12 EDT Ventricular Rate:  79 PR Interval:  167 QRS Duration: 99 QT Interval:  436 QTC Calculation: 500 R Axis:   258 Text Interpretation: Sinus rhythm Left anterior fascicular block Borderline prolonged QT interval No acute changes No significant change since last tracing Confirmed by Derwood Kaplan [phone]) on 01/01/2022 12:01:53 AM  Radiology No results found.  Procedures .Critical Care  Performed by: Derwood Kaplan, MD Authorized by: Derwood Kaplan, MD   Critical care provider statement:    Critical care time (minutes):  40   Critical care was time spent personally by me on the following activities:  Development of treatment plan with patient or surrogate, discussions with consultants, evaluation of patient's response to treatment, examination of patient, ordering and review of laboratory studies, ordering and review of radiographic studies, ordering and performing treatments and interventions, pulse oximetry, re-evaluation of patient's condition and review of old charts     Medications Ordered in ED Medications - No data to display  ED Course/ Medical Decision Making/ A&P                           Medical Decision Making Amount and/or Complexity of Data Reviewed Labs: ordered.    This patient presents to the ED with chief complaint(s) of altered mental status with pertinent past medical history of substance use history which further complicates the presenting complaint.   The differential diagnosis includes substance use disorder leading to toxic encephalopathy, metabolic encephalopathy.  Patient is arousable and she is moving all 4 extremities.  Pupils are  2 mm and equal, there is no nystagmus  When awake, she is trying to answer questions appropriately, I am not sure if she is giving [place] correct information or not  The initial plan is to get basic labs.  We will put end-tidal CO2 on her.  We will monitor her for sobriety   Additional history obtained: Additional history obtained from family Records reviewed previous admission documents reviewed previous UDS  Independent labs interpretation:  The following labs were independently interpreted: Patient has THC positive, other labs are overall reassuring.  Patient reassessed.  She still obtunded, arousable to sternal rub.  Answers questions and falls asleep midsentence.  No need for CT scan.  Patient's care will be  signed out to incoming team, she will be discharged when she is more alert.  Final Clinical Impression(s) / ED Diagnoses Final diagnoses:  Toxic encephalopathy    Rx / DC Orders ED Discharge Orders     None         Derwood Kaplan, MD 01/01/22 [phone]

## 2022-01-01 NOTE — ED Provider Notes (Signed)
Patient signed out pending reassessment.  On my reassessment this morning she is awake, alert.  She is ambulatory and has been able to eat.  She is calling a ride.  Vital signs are reassuring.   Shon Baton, MD 01/01/22 [phone]

## 2022-01-01 NOTE — ED Notes (Signed)
I provided reinforced discharge education based off of discharge instructions. Pt acknowledged and understood my education. Pt had no further questions/concerns for provider/myself.  °

## 2022-01-01 NOTE — ED Notes (Signed)
Assumed care with RN oversight. Pt A&Ox4, no respiratory distress. 

## 2022-01-01 NOTE — ED Notes (Signed)
Called pt's roommate Darl Pikes with verbal permission from pt to see if roommate could provide pt ride home. She said she will be here to pick up pt around 06:00.

## 2022-03-27 ENCOUNTER — Emergency Department (HOSPITAL_COMMUNITY): Payer: Medicare Other

## 2022-03-27 ENCOUNTER — Emergency Department (HOSPITAL_COMMUNITY)
Admission: EM | Admit: 2022-03-27 | Discharge: 2022-03-27 | Disposition: A | Discharge status: Home / Self Care | Payer: Medicare Other | Attending: Emergency Medicine | Admitting: Emergency Medicine

## 2022-03-27 DIAGNOSIS — M47812 Spondylosis without myelopathy or radiculopathy, cervical region: Secondary | ICD-10-CM | POA: Diagnosis not present

## 2022-03-27 DIAGNOSIS — Z79899 Other long term (current) drug therapy: Secondary | ICD-10-CM | POA: Insufficient documentation

## 2022-03-27 DIAGNOSIS — R4182 Altered mental status, unspecified: Secondary | ICD-10-CM | POA: Insufficient documentation

## 2022-03-27 DIAGNOSIS — K7460 Unspecified cirrhosis of liver: Secondary | ICD-10-CM | POA: Diagnosis not present

## 2022-03-27 DIAGNOSIS — Z23 Encounter for immunization: Secondary | ICD-10-CM | POA: Insufficient documentation

## 2022-03-27 DIAGNOSIS — I10 Essential (primary) hypertension: Secondary | ICD-10-CM | POA: Diagnosis not present

## 2022-03-27 DIAGNOSIS — E1165 Type 2 diabetes mellitus with hyperglycemia: Secondary | ICD-10-CM | POA: Diagnosis not present

## 2022-03-27 DIAGNOSIS — E039 Hypothyroidism, unspecified: Secondary | ICD-10-CM | POA: Insufficient documentation

## 2022-03-27 DIAGNOSIS — R7989 Other specified abnormal findings of blood chemistry: Secondary | ICD-10-CM | POA: Insufficient documentation

## 2022-03-27 DIAGNOSIS — Z7984 Long term (current) use of oral hypoglycemic drugs: Secondary | ICD-10-CM | POA: Diagnosis not present

## 2022-03-27 LAB — CBC
HCT: 41.7 % (ref 36.0–46.0)
Hemoglobin: 13.1 g/dL (ref 12.0–15.0)
MCH: 27.9 pg (ref 26.0–34.0)
MCHC: 31.4 g/dL (ref 30.0–36.0)
MCV: 88.7 fL (ref 80.0–100.0)
Platelets: 179 10*3/uL (ref 150–400)
RBC: 4.7 MIL/uL (ref 3.87–5.11)
RDW: 15.4 % (ref 11.5–15.5)
WBC: 5.7 10*3/uL (ref 4.0–10.5)
nRBC: 0 % (ref 0.0–0.2)

## 2022-03-27 LAB — COMPREHENSIVE METABOLIC PANEL
ALT: 27 U/L (ref 0–44)
AST: 24 U/L (ref 15–41)
Albumin: 3.7 g/dL (ref 3.5–5.0)
Alkaline Phosphatase: 63 U/L (ref 38–126)
Anion gap: 12 (ref 5–15)
BUN: 18 mg/dL (ref 8–23)
CO2: 21 mmol/L — ABNORMAL LOW (ref 22–32)
Calcium: 9.3 mg/dL (ref 8.9–10.3)
Chloride: 103 mmol/L (ref 98–111)
Creatinine, Ser: 0.89 mg/dL (ref 0.44–1.00)
GFR, Estimated: >60 mL/min (ref >60)
Glucose, Bld: 256 mg/dL — ABNORMAL HIGH (ref 70–99)
Potassium: 3.6 mmol/L (ref 3.5–5.1)
Sodium: 136 mmol/L (ref 135–145)
Total Bilirubin: 0.6 mg/dL (ref 0.3–1.2)
Total Protein: 6.9 g/dL (ref 6.5–8.1)

## 2022-03-27 LAB — ETHANOL: Alcohol, Ethyl (B): <10 mg/dL (ref <10)

## 2022-03-27 LAB — SALICYLATE LEVEL: Salicylate Lvl: <7 mg/dL — ABNORMAL LOW (ref 7.0–30.0)

## 2022-03-27 LAB — OSMOLALITY: Osmolality: 303 mOsm/kg — ABNORMAL HIGH (ref 275–295)

## 2022-03-27 LAB — CK: Total CK: 66 U/L (ref 38–234)

## 2022-03-27 LAB — I-STAT CHEM 8, ED
BUN: 18 mg/dL (ref 8–23)
Calcium, Ion: 1.09 mmol/L — ABNORMAL LOW (ref 1.15–1.40)
Chloride: 106 mmol/L (ref 98–111)
Creatinine, Ser: 0.8 mg/dL (ref 0.44–1.00)
Glucose, Bld: 250 mg/dL — ABNORMAL HIGH (ref 70–99)
HCT: 37 % (ref 36.0–46.0)
Hemoglobin: 12.6 g/dL (ref 12.0–15.0)
Potassium: 3.6 mmol/L (ref 3.5–5.1)
Sodium: 140 mmol/L (ref 135–145)
TCO2: 23 mmol/L (ref 22–32)

## 2022-03-27 LAB — LACTIC ACID, PLASMA: Lactic Acid, Venous: 1.4 mmol/L (ref 0.5–1.9)

## 2022-03-27 LAB — PROTIME-INR
INR: 1.1 (ref 0.8–1.2)
Prothrombin Time: 14.3 seconds (ref 11.4–15.2)

## 2022-03-27 LAB — TROPONIN I (HIGH SENSITIVITY): Troponin I (High Sensitivity): 4 ng/L (ref <18)

## 2022-03-27 LAB — BRAIN NATRIURETIC PEPTIDE: B Natriuretic Peptide: 48.8 pg/mL (ref 0.0–100.0)

## 2022-03-27 LAB — TSH: TSH: 0.242 u[IU]/mL — ABNORMAL LOW (ref 0.350–4.500)

## 2022-03-27 LAB — ACETAMINOPHEN LEVEL: Acetaminophen (Tylenol), Serum: <10 ug/mL — ABNORMAL LOW (ref 10–30)

## 2022-03-27 LAB — LIPASE, BLOOD: Lipase: 31 U/L (ref 11–51)

## 2022-03-27 MED ORDER — TETANUS-DIPHTH-ACELL PERTUSSIS 5-2.5-18.5 LF-MCG/0.5 IM SUSY
0.5000 mL | PREFILLED_SYRINGE | Freq: Once | INTRAMUSCULAR | Status: AC
  Administered 2022-03-27: 0.5 mL via INTRAMUSCULAR
  Filled 2022-03-27: qty 0.5

## 2022-03-27 MED ORDER — ACETAMINOPHEN 500 MG PO TABS
1000.0000 mg | ORAL_TABLET | Freq: Once | ORAL | Status: AC
  Administered 2022-03-27: 1000 mg via ORAL
  Filled 2022-03-27: qty 2

## 2022-03-27 NOTE — ED Notes (Signed)
Patient has been so far unable to make contact with her room mate but requests to be taken to the lobby where she will continue to call and will walk around to stretch her legs. Patient is alert, oriented, and in no apparent distress at this time.
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
## 2022-03-27 NOTE — ED Notes (Signed)
Patient transported to CT 

## 2022-03-27 NOTE — Discharge Instructions (Addendum)
I suspect your confusion was due to increased baclofen use and I am reassured that it wore off.  Your lab work overall looks good today.  Avoid using baclofen more than prescribed, do not take any for the next 24 to 48 hours.  Avoid alcohol or other substances with this medication.  Return for new or worsening symptoms.

## 2022-03-31 ENCOUNTER — Ambulatory Visit (HOSPITAL_COMMUNITY)
Admission: EM | Admit: 2022-03-31 | Discharge: 2022-03-31 | Disposition: A | Discharge status: Home / Self Care | Payer: Medicare Other | Attending: Psychiatry | Admitting: Psychiatry

## 2022-03-31 DIAGNOSIS — M069 Rheumatoid arthritis, unspecified: Secondary | ICD-10-CM | POA: Diagnosis present

## 2022-03-31 DIAGNOSIS — F4310 Post-traumatic stress disorder, unspecified: Secondary | ICD-10-CM | POA: Insufficient documentation

## 2022-03-31 DIAGNOSIS — F410 Panic disorder [episodic paroxysmal anxiety] without agoraphobia: Secondary | ICD-10-CM

## 2022-03-31 DIAGNOSIS — Z592 Discord with neighbors, lodgers and landlord: Secondary | ICD-10-CM

## 2022-03-31 NOTE — Discharge Instructions (Addendum)
Take all medications as prescribed. Keep all follow-up appointments as scheduled.  Do not consume alcohol or use illegal drugs while on prescription medications. Report any adverse effects from your medications to your primary care provider promptly.  In the event of recurrent symptoms or worsening symptoms, call 911, a crisis hotline, or go to the nearest emergency department for evaluation.    How to get a blister pack for medication.  If you have lots of different medications to manage, your local pharmacy may be able to help by providing medication in a dosette box or blister pack. Ask your pharmacist for more information. If you've got any out-of-date or unused medication, take it to your pharmacist so they can dispose of it safely.  Call [facility] for resources  [place] [phone]
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
## 2022-03-31 NOTE — ED Notes (Addendum)
Patient is discharging at this time. Patient is stable. Patient denies SI,HI, and A/V/H with no plan/intent. Printed AVS reviewed with and given to patient along with follow up appointments and resources. Patient verbalized all understanding. All valuables/belongings returned to patient. No s/s of current distress. Patient transported home by taxi.

## 2022-04-01 ENCOUNTER — Emergency Department (HOSPITAL_COMMUNITY)
Admission: EM | Admit: 2022-04-01 | Discharge: 2022-04-02 | Disposition: A | Discharge status: Home / Self Care | Payer: Medicare Other | Attending: Emergency Medicine | Admitting: Emergency Medicine

## 2022-04-01 ENCOUNTER — Emergency Department (HOSPITAL_COMMUNITY): Payer: Medicare Other

## 2022-04-01 ENCOUNTER — Encounter (HOSPITAL_COMMUNITY): Payer: Self-pay | Admitting: Emergency Medicine

## 2022-04-01 ENCOUNTER — Other Ambulatory Visit: Payer: Self-pay

## 2022-04-01 DIAGNOSIS — M47812 Spondylosis without myelopathy or radiculopathy, cervical region: Secondary | ICD-10-CM | POA: Diagnosis not present

## 2022-04-01 DIAGNOSIS — Z7984 Long term (current) use of oral hypoglycemic drugs: Secondary | ICD-10-CM | POA: Diagnosis not present

## 2022-04-01 DIAGNOSIS — S022XXA Fracture of nasal bones, initial encounter for closed fracture: Secondary | ICD-10-CM | POA: Insufficient documentation

## 2022-04-01 DIAGNOSIS — S0992XA Unspecified injury of nose, initial encounter: Secondary | ICD-10-CM | POA: Diagnosis present

## 2022-04-01 DIAGNOSIS — W08XXXA Fall from other furniture, initial encounter: Secondary | ICD-10-CM | POA: Insufficient documentation

## 2022-04-01 DIAGNOSIS — Z79899 Other long term (current) drug therapy: Secondary | ICD-10-CM | POA: Insufficient documentation

## 2022-04-01 DIAGNOSIS — W19XXXA Unspecified fall, initial encounter: Secondary | ICD-10-CM

## 2022-04-01 DIAGNOSIS — I6782 Cerebral ischemia: Secondary | ICD-10-CM | POA: Diagnosis not present

## 2022-04-01 DIAGNOSIS — S0990XA Unspecified injury of head, initial encounter: Secondary | ICD-10-CM

## 2022-04-01 DIAGNOSIS — S0285XA Fracture of orbit, unspecified, initial encounter for closed fracture: Secondary | ICD-10-CM | POA: Diagnosis not present

## 2022-04-01 DIAGNOSIS — S0282XA Fracture of other specified skull and facial bones, left side, initial encounter for closed fracture: Secondary | ICD-10-CM | POA: Insufficient documentation

## 2022-04-01 LAB — BASIC METABOLIC PANEL
Anion gap: 9 (ref 5–15)
BUN: 13 mg/dL (ref 8–23)
CO2: 26 mmol/L (ref 22–32)
Calcium: 9.4 mg/dL (ref 8.9–10.3)
Chloride: 104 mmol/L (ref 98–111)
Creatinine, Ser: 1.03 mg/dL — ABNORMAL HIGH (ref 0.44–1.00)
GFR, Estimated: >60 mL/min (ref >60)
Glucose, Bld: 102 mg/dL — ABNORMAL HIGH (ref 70–99)
Potassium: 3.5 mmol/L (ref 3.5–5.1)
Sodium: 139 mmol/L (ref 135–145)

## 2022-04-01 LAB — CBC WITH DIFFERENTIAL/PLATELET
Abs Immature Granulocytes: 0.02 10*3/uL (ref 0.00–0.07)
Basophils Absolute: 0 10*3/uL (ref 0.0–0.1)
Basophils Relative: 1 %
Eosinophils Absolute: 0.1 10*3/uL (ref 0.0–0.5)
Eosinophils Relative: 1 %
HCT: 35.7 % — ABNORMAL LOW (ref 36.0–46.0)
Hemoglobin: 11.6 g/dL — ABNORMAL LOW (ref 12.0–15.0)
Immature Granulocytes: 0 %
Lymphocytes Relative: 38 %
Lymphs Abs: 2.1 10*3/uL (ref 0.7–4.0)
MCH: 27.9 pg (ref 26.0–34.0)
MCHC: 32.5 g/dL (ref 30.0–36.0)
MCV: 85.8 fL (ref 80.0–100.0)
Monocytes Absolute: 0.3 10*3/uL (ref 0.1–1.0)
Monocytes Relative: 6 %
Neutro Abs: 3.1 10*3/uL (ref 1.7–7.7)
Neutrophils Relative %: 54 %
Platelets: 171 10*3/uL (ref 150–400)
RBC: 4.16 MIL/uL (ref 3.87–5.11)
RDW: 15 % (ref 11.5–15.5)
WBC: 5.6 10*3/uL (ref 4.0–10.5)
nRBC: 0 % (ref 0.0–0.2)

## 2022-04-01 LAB — TYPE AND SCREEN
ABO/RH(D): A POS
Antibody Screen: NEGATIVE

## 2022-04-01 LAB — ETHANOL: Alcohol, Ethyl (B): <10 mg/dL (ref <10)

## 2022-04-01 MED ORDER — CEPHALEXIN 500 MG PO CAPS: 500.0000 mg | ORAL_CAPSULE | Freq: Four times a day (QID) | ORAL | 0 refills | Status: DC
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
## 2022-04-01 NOTE — ED Provider Notes (Signed)
[facility] EMERGENCY DEPARTMENT Provider Note   CSN: [id number] Arrival date & time: 04/01/22  1711     History  Chief Complaint  Patient presents with   Fall   Loss of Consciousness    Taylor Benitez is a 61 y.o. female.  82-year-old female with prior medical history as detailed below presents for evaluation.  Patient arrives from home with EMS transport.  Patient reports that she lost her balance and fell.  She was getting up off of her couch when this occurred.  She did strike her face and nose.  She reports brief LOC for approximately 2 to 3 seconds.  She complains of mild pain to the nose upon evaluation.  EMS reports that the roommate was concerned about possible drug and/or alcohol use.  The patient has a reported history of inappropriate drug and or alcohol use.  Patient is alert and comfortable at time of evaluation.  She denies other injury outside of her face.  She reports that she will occasionally lose her balance and fall as she did today.  The history is provided by the patient and medical records.  Loss of Consciousness      Home Medications Prior to Admission medications   Medication Sig Start Date End Date Taking? Authorizing Provider  amitriptyline (ELAVIL) 100 MG tablet TAKE 2 TABLETS BY MOUTH AT BEDTIME Patient taking differently: Take 200 mg by mouth. 06/14/21   Tower, Wynelle [name], MD  baclofen (LIORESAL) 10 MG tablet Take 10 mg by mouth 2 (two) times daily as needed for muscle spasms.    [provider]  celecoxib (CELEBREX) 100 MG capsule Take 100 mg by mouth 2 (two) times daily.    [provider]  Cholecalciferol (VITAMIN D3) 1.25 MG (50000 UT) TABS Take 1 tablet by mouth every 7 (seven) days. Patient taking differently: Take 1 tablet by mouth every 7 (seven) days. Monday's 12/19/19   [name], FNP  empagliflozin (JARDIANCE) 25 MG TABS tablet Take 25 mg by mouth daily.    [provider]  hydrOXYzine  (ATARAX/VISTARIL) 25 MG tablet TAKE 1/2 TO 1 TABLET BY MOUTH EVERY 8 HOURS AS NEEDED FOR ANXETY Patient not taking: Reported on 07/30/2021 10/18/20   Tower, Wynelle [name], MD  levothyroxine (SYNTHROID) 150 MCG tablet Take 1 tablet (150 mcg total) by mouth daily. Patient taking differently: Take 150 mcg by mouth daily. 08/09/20   Tower, Wynelle [name], MD  metFORMIN (GLUMETZA) 1000 MG (MOD) 24 hr tablet Take 1,000 mg by mouth 2 (two) times daily with a meal.    [provider]  Multiple Vitamins-Minerals (MULTIVITAMIN WOMEN PO) Take by mouth daily.    [provider]  oxyCODONE (OXY IR/ROXICODONE) 5 MG immediate release tablet Take 5 mg by mouth 3 (three) times daily as needed for severe pain.    [provider]      Allergies    Glipizide, Metformin and related, and Penicillins    Review of Systems   Review of Systems  Cardiovascular:  Positive for syncope.  All other systems reviewed and are negative.   Physical Exam Updated Vital Signs BP (!) 152/70   Pulse 84   Temp 98.9 F (37.2 C)   Resp 13   SpO2 95%  Physical Exam Vitals and nursing note reviewed.  Constitutional:      General: She is not in acute distress.    Appearance: Normal appearance. She is well-developed.  HENT:     Head: Normocephalic.  Comments: Mild contusion and ecchymosis noted around the left orbit and left nose.  No active hemorrhage noted from nose.  Resolved epistaxis appreciated.  No septal hematoma appreciated.  Patient is a edentulous.  She is without evidence of significant dental trauma. Eyes:     Conjunctiva/sclera: Conjunctivae normal.     Pupils: Pupils are equal, round, and reactive to light.  Cardiovascular:     Rate and Rhythm: Normal rate and regular rhythm.     Heart sounds: Normal heart sounds.  Pulmonary:     Effort: Pulmonary effort is normal. No respiratory distress.     Breath sounds: Normal breath sounds.  Abdominal:     General: There is no distension.      Palpations: Abdomen is soft.     Tenderness: There is no abdominal tenderness.  Musculoskeletal:        General: No deformity. Normal range of motion.     Cervical back: Normal range of motion and neck supple.  Skin:    General: Skin is warm and dry.  Neurological:     General: No focal deficit present.     Mental Status: She is alert and oriented to person, place, and time.     ED Results / Procedures / Treatments   Labs (all labs ordered are listed, but only abnormal results are displayed) Labs Reviewed  CBC WITH DIFFERENTIAL/PLATELET  ETHANOL  BASIC METABOLIC PANEL  RAPID URINE DRUG SCREEN, HOSP PERFORMED  TYPE AND SCREEN    EKG None  Radiology DG Chest Port 1 View  Result Date: 04/01/2022 CLINICAL DATA:  Fall. EXAM: PORTABLE CHEST 1 VIEW COMPARISON:  03/27/2022 FINDINGS: The heart size and mediastinal contours are within normal limits. Both lungs are clear. There are degenerative changes noted within both glenohumeral joints. IMPRESSION: No active disease. Electronically Signed   By: Signa Kell M.D.   On: 04/01/2022 17:36    Procedures Procedures    Medications Ordered in ED Medications - No data to display  ED Course/ Medical Decision Making/ A&P                           Medical Decision Making Amount and/or Complexity of Data Reviewed Labs: ordered. Radiology: ordered.  Risk Prescription drug management.    Medical Screen Complete  This patient presented to the ED with complaint of fall, head injury.  This complaint involves an extensive number of treatment options. The initial differential diagnosis includes, but is not limited to, trauma laded to fall, metabolic abnormality, etc.  This presentation is: Acute, Self-Limited, Previously Undiagnosed, Uncertain Prognosis, Complicated, Systemic Symptoms, and Threat to Life/Bodily Function  Patient with mechanical fall.  Patient with injuries to face.  Screening laboratory studies did not reveal  significant acute pathology.  CT imaging reveals evidence of orbital fracture nasal bone fracture.  Imaging and injury of discussed with Dr. Jearld Fenton with ENT.    Patient is appropriate for discharge home.  Patient does understand need for close follow-up with ENT.  Strict return precautions given and understood.  Importance of close follow-up is repeatedly stressed.  Additional history obtained: External records from outside sources obtained and reviewed including prior ED visits and prior Inpatient records.    Lab Tests:  I ordered and personally interpreted labs.  The pertinent results include: CBC, BMP, EtOH   Imaging Studies ordered:  I ordered imaging studies including CT head, CT maxillofacial, CT C-spine I independently visualized and interpreted obtained imaging which  showed orbital fractures, nasal fracture I agree with the radiologist interpretation.   Cardiac Monitoring:  The patient was maintained on a cardiac monitor.  I personally viewed and interpreted the cardiac monitor which showed an underlying rhythm of: NSR  Problem List / ED Course:  Fall, head injury, orbital fracture, nasal bone fracture   Reevaluation:  After the interventions noted above, I reevaluated the patient and found that they have: stayed the same    Disposition:  After consideration of the diagnostic results and the patients response to treatment, I feel that the patent would benefit from close outpatient follow-up.  Patient ambulated easily from the ED at time of discharge.  She was in no distress.          Final Clinical Impression(s) / ED Diagnoses Final diagnoses:  Fall, initial encounter  Injury of head, initial encounter  Closed fracture of orbit, initial encounter (HCC)  Closed fracture of nasal bone, initial encounter    Rx / DC Orders ED Discharge Orders          Ordered    cephALEXin (KEFLEX) 500 MG capsule  4 times daily        04/01/22 2226               Wynetta Fines, MD 04/02/22 0001

## 2022-04-01 NOTE — ED Triage Notes (Signed)
Pt was BIB [facility]. EMS report patient roommate stated the patient was trying to got up off the couch and hit her face, that she lost LOC for 3 seconds and came back around. Report patient has nose bleed and bruises from fall. EMS reported patient have HX of drug and opioids abuse, but patient does not know the cause of her fall.

## 2022-04-01 NOTE — ED Notes (Signed)
Patient reports she currently has no way to get home.  States she will be able to find someone in the morning.  Patient is unsteady on her feet and has had multiple falls in the last week.  Patient moved to hallway bed to wait for ride.  Provided with sandwich and drink.

## 2022-04-02 MED ORDER — IBUPROFEN 400 MG PO TABS
600.0000 mg | ORAL_TABLET | Freq: Once | ORAL | Status: AC
  Administered 2022-04-02: 600 mg via ORAL
  Filled 2022-04-02: qty 1

## 2022-04-02 NOTE — ED Notes (Signed)
[employer] called for pt, cane provided and pt able to safely ambulate w/ cane on ambulation trial. Pt assisted to exit via wheelchair, cab voucher provided.  No further needs identified at this time

## 2022-04-02 NOTE — Progress Notes (Signed)
CSW spoke with patient at bedside in the hallway. Patient reports she lives with a roommate. Patient reports she does need assistance to get home - a cab voucher and new cane were obtained for patient. Patient confirms she has a PCP at [facility] and is medication compliant. Patient reports frequent falls due to neuropathy in her feet but states she is able to complete all her ADL's independently.  CSW spoke with Deneise Lever, RN who is agreeable to call the cab company.  Madilyn Fireman, MSW, LCSW Transitions of Care  Clinical Social Worker II [phone]

## 2022-05-07 NOTE — ED Provider Notes (Signed)
[facility] EMERGENCY DEPARTMENT Provider Note   CSN: [id number] Arrival date & time: 03/27/22  5284     History  Chief Complaint  Patient presents with   Altered Mental Status    Taylor Benitez is a 61 y.o. female.  TIAWANNA Benitez is a 61 y.o. female with hx of HTN, DM, Cirrhosis, hypothyroidism, polysubstance abuse w/ prior overdose, who present via EMS for evalaution of confusion and bizare behavior. EMS called by roommate due to increased confusion and abnormal behavior.  EMS were told by roommate that patient sometimes takes extra medications and uses drugs and alcohol but roommate was unable to provide further information on scene and no contact information is available for roommate.  When EMS arrived on scene they noted stool in the patient's room and outside of her toilet.  She had some bruising in the skin tear on her elbow and patient was unsure if she had fallen so EMS placed patient in c-collar.  Last known normal was 2 days ago.  When speaking to patient she asks repetitive questions and is unable to provide answers to questions or carry on a conversation limiting information.  Patient is unsure if she fell.  Reports she may have taken extra of her baclofen upon further questioning.  Unsure if she took any extra pain medication and when asked about drugs or alcohol patient states that she cannot remember.  On chart review patient has had similar presentations in the past, most recently in July of this year she was seen in the ED with similar confusion and overall reassuring work-up and after several hours patient returned to baseline.  The history is provided by the patient and medical records.  Altered Mental Status      Home Medications Prior to Admission medications   Medication Sig Start Date End Date Taking? Authorizing Provider  amitriptyline (ELAVIL) 100 MG tablet TAKE 2 TABLETS BY MOUTH AT BEDTIME Patient taking differently: Take 200 mg by mouth.  06/14/21   Tower, Audrie Gallus, MD  baclofen (LIORESAL) 10 MG tablet Take 10 mg by mouth 2 (two) times daily as needed for muscle spasms.    [provider]  celecoxib (CELEBREX) 100 MG capsule Take 100 mg by mouth 2 (two) times daily.    [provider]  cephALEXin (KEFLEX) 500 MG capsule Take 1 capsule (500 mg total) by mouth 4 (four) times daily. 04/01/22   Wynetta Fines, MD  Cholecalciferol (VITAMIN D3) 1.25 MG (50000 UT) TABS Take 1 tablet by mouth every 7 (seven) days. Patient taking differently: Take 1 tablet by mouth every 7 (seven) days. Monday's 12/19/19   Emi Belfast, FNP  empagliflozin (JARDIANCE) 25 MG TABS tablet Take 25 mg by mouth daily.    [provider]  hydrOXYzine (ATARAX/VISTARIL) 25 MG tablet TAKE 1/2 TO 1 TABLET BY MOUTH EVERY 8 HOURS AS NEEDED FOR ANXETY Patient not taking: Reported on 07/30/2021 10/18/20   Tower, Audrie Gallus, MD  levothyroxine (SYNTHROID) 150 MCG tablet Take 1 tablet (150 mcg total) by mouth daily. Patient taking differently: Take 150 mcg by mouth daily. 08/09/20   Tower, Audrie Gallus, MD  metFORMIN (GLUMETZA) 1000 MG (MOD) 24 hr tablet Take 1,000 mg by mouth 2 (two) times daily with a meal.    [provider]  Multiple Vitamins-Minerals (MULTIVITAMIN WOMEN PO) Take by mouth daily.    [provider]  oxyCODONE (OXY IR/ROXICODONE) 5 MG immediate release tablet Take 5 mg by mouth 3 (  three) times daily as needed for severe pain.    [provider]      Allergies    Glipizide, Metformin and related, and Penicillins    Review of Systems   Review of Systems  Unable to perform ROS: Mental status change    Physical Exam Updated Vital Signs BP (!) 172/88 (BP Location: Right Arm)   Pulse 70   Temp 98 F (36.7 C) (Oral)   Resp 18   SpO2 100%  Physical Exam Vitals and nursing note reviewed.  Constitutional:      General: She is not in acute distress.    Appearance: Normal appearance. She is  well-developed. She is not diaphoretic.     Comments: Alert but confused with difficulty answering questions  HENT:     Head: Normocephalic and atraumatic.     Comments: No evident hematoma, lacerations, step-off or deformity    Nose: Nose normal. No congestion or rhinorrhea.     Mouth/Throat:     Mouth: Mucous membranes are moist.     Pharynx: Oropharynx is clear.  Eyes:     General:        Right eye: No discharge.        Left eye: No discharge.     Extraocular Movements: Extraocular movements intact.     Pupils: Pupils are equal, round, and reactive to light.  Neck:     Comments: C-collar placed by EMS, no ecchymosis, step-off or deformity noted over C-spine Cardiovascular:     Rate and Rhythm: Normal rate and regular rhythm.     Pulses: Normal pulses.     Heart sounds: Normal heart sounds.  Pulmonary:     Effort: Pulmonary effort is normal. No respiratory distress.     Breath sounds: Normal breath sounds. No wheezing or rales.     Comments: Respirations equal and unlabored, patient able to speak in full sentences, lungs clear to auscultation bilaterally  Chest:     Chest wall: No tenderness.  Abdominal:     General: Bowel sounds are normal. There is no distension.     Palpations: Abdomen is soft. There is no mass.     Tenderness: There is no abdominal tenderness. There is no guarding.     Comments: Abdomen soft, nondistended, nontender to palpation in all quadrants without guarding or peritoneal signs  Musculoskeletal:     Cervical back: Neck supple.     Comments: Left elbow with small skin tear and some surrounding ecchymosis but no bony tenderness or effusion.  No midline spinal tenderness, all compartments soft, all joints supple and easily movable  Skin:    General: Skin is warm and dry.     Capillary Refill: Capillary refill takes less than 2 seconds.  Neurological:     Mental Status: She is alert and oriented to person, place, and time.     Coordination: Coordination  normal.     Comments: Speech is somewhat slurred and slowed, able to follow commands CN III-XII intact Normal strength in upper and lower extremities bilaterally including dorsiflexion and plantar flexion, strong and equal grip strength Sensation normal to light and sharp touch Moves extremities without ataxia, coordination intact  Psychiatric:        Attention and Perception: She is inattentive.        Mood and Affect: Affect is flat.        Speech: Speech is slurred.        Behavior: Behavior normal.     ED  Results / Procedures / Treatments   Labs (all labs ordered are listed, but only abnormal results are displayed) Labs Reviewed  COMPREHENSIVE METABOLIC PANEL - Abnormal; Notable for the following components:      Result Value   CO2 21 (*)    Glucose, Bld 256 (*)    All other components within normal limits  TSH - Abnormal; Notable for the following components:   TSH 0.242 (*)    All other components within normal limits  ACETAMINOPHEN LEVEL - Abnormal; Notable for the following components:   Acetaminophen (Tylenol), Serum <10 (*)    All other components within normal limits  SALICYLATE LEVEL - Abnormal; Notable for the following components:   Salicylate Lvl <7.0 (*)    All other components within normal limits  OSMOLALITY - Abnormal; Notable for the following components:   Osmolality 303 (*)    All other components within normal limits  I-STAT CHEM 8, ED - Abnormal; Notable for the following components:   Glucose, Bld 250 (*)    Calcium, Ion 1.09 (*)    All other components within normal limits  CBC  ETHANOL  LIPASE, BLOOD  PROTIME-INR  BRAIN NATRIURETIC PEPTIDE  CK  LACTIC ACID, PLASMA  I-STAT VENOUS BLOOD GAS, ED  TROPONIN I (HIGH SENSITIVITY)    EKG None  Radiology CT Head Wo Contrast  Result Date: 03/27/2022 CLINICAL DATA:  61 year old female with history of delirium. Altered mental status. History of fall. EXAM: CT HEAD WITHOUT CONTRAST CT CERVICAL  SPINE WITHOUT CONTRAST TECHNIQUE: Multidetector CT imaging of the head and cervical spine was performed following the standard protocol without intravenous contrast. Multiplanar CT image reconstructions of the cervical spine were also generated. RADIATION DOSE REDUCTION: This exam was performed according to the departmental dose-optimization program which includes automated exposure control, adjustment of the mA and/or kV according to patient size and/or use of iterative reconstruction technique. COMPARISON:  Head and cervical spine CT 12/09/2015. FINDINGS: CT HEAD FINDINGS Brain: No evidence of acute infarction, hemorrhage, hydrocephalus, extra-axial collection or mass lesion/mass effect. Vascular: No hyperdense vessel or unexpected calcification. Skull: Normal. Negative for fracture or focal lesion. Sinuses/Orbits: No acute finding. Other: None. CT CERVICAL SPINE FINDINGS Alignment: Normal. Skull base and vertebrae: No acute fracture. No primary bone lesion or focal pathologic process. Soft tissues and spinal canal: No prevertebral fluid or swelling. No visible canal hematoma. Disc levels: Multilevel degenerative disc disease, most severe at C5-C6 and C6-C7 where there is near complete bony fusion. Severe multilevel facet arthropathy bilaterally. Upper chest: Unremarkable. Other: None. IMPRESSION: 1. No acute displaced skull fractures. No acute intracranial abnormalities. The appearance of the brain is normal for age. 2. No acute abnormality of the cervical spine. 3. Multilevel degenerative disc disease and cervical spondylosis, as above. Electronically Signed   By: Trudie Reed M.D.   On: 03/27/2022 07:20   CT Cervical Spine Wo Contrast  Result Date: 03/27/2022 CLINICAL DATA:  61 year old female with history of delirium. Altered mental status. History of fall. EXAM: CT HEAD WITHOUT CONTRAST CT CERVICAL SPINE WITHOUT CONTRAST TECHNIQUE: Multidetector CT imaging of the head and cervical spine was performed  following the standard protocol without intravenous contrast. Multiplanar CT image reconstructions of the cervical spine were also generated. RADIATION DOSE REDUCTION: This exam was performed according to the departmental dose-optimization program which includes automated exposure control, adjustment of the mA and/or kV according to patient size and/or use of iterative reconstruction technique. COMPARISON:  Head and cervical spine CT 12/09/2015. FINDINGS: CT  HEAD FINDINGS Brain: No evidence of acute infarction, hemorrhage, hydrocephalus, extra-axial collection or mass lesion/mass effect. Vascular: No hyperdense vessel or unexpected calcification. Skull: Normal. Negative for fracture or focal lesion. Sinuses/Orbits: No acute finding. Other: None. CT CERVICAL SPINE FINDINGS Alignment: Normal. Skull base and vertebrae: No acute fracture. No primary bone lesion or focal pathologic process. Soft tissues and spinal canal: No prevertebral fluid or swelling. No visible canal hematoma. Disc levels: Multilevel degenerative disc disease, most severe at C5-C6 and C6-C7 where there is near complete bony fusion. Severe multilevel facet arthropathy bilaterally. Upper chest: Unremarkable. Other: None. IMPRESSION: 1. No acute displaced skull fractures. No acute intracranial abnormalities. The appearance of the brain is normal for age. 2. No acute abnormality of the cervical spine. 3. Multilevel degenerative disc disease and cervical spondylosis, as above. Electronically Signed   By: Trudie Reed M.D.   On: 03/27/2022 07:20   DG Elbow Complete Left  Result Date: 03/27/2022 CLINICAL DATA:  61 year old female with history of trauma from a fall complaining of left elbow pain. EXAM: LEFT ELBOW - COMPLETE 3+ VIEW COMPARISON:  No priors. FINDINGS: There is no evidence of fracture, dislocation, or joint effusion. There is no evidence of arthropathy or other focal bone abnormality. Soft tissues are unremarkable. IMPRESSION: Negative.  Electronically Signed   By: Trudie Reed M.D.   On: 03/27/2022 07:09   DG Chest 1 View  Result Date: 03/27/2022 CLINICAL DATA:  61 year old female with history of altered mental status. EXAM: CHEST  1 VIEW COMPARISON:  Chest x-ray 12/09/2015. FINDINGS: Lung volumes are normal. No consolidative airspace disease. No pleural effusions. No pneumothorax. No pulmonary nodule or mass noted. Pulmonary vasculature and the cardiomediastinal silhouette are within normal limits. IMPRESSION: No radiographic evidence of acute cardiopulmonary disease. Electronically Signed   By: Trudie Reed M.D.   On: 03/27/2022 07:08     Procedures Procedures    Medications Ordered in ED Medications  Tdap (BOOSTRIX) injection 0.5 mL (0.5 mLs Intramuscular Given 03/27/22 1618)  acetaminophen (TYLENOL) tablet 1,000 mg (1,000 mg Oral Given 03/27/22 1618)    ED Course/ Medical Decision Making/ A&P                           Medical Decision Making Risk OTC drugs.   61 y.o. female presents to the ED with complaints of AMS, this involves an extensive number of treatment options, and is a complaint that carries with it a high risk of complications and morbidity.  The differential diagnosis includes toxic encephalopathy due to potential drugs versus medication overuse, metabolic encephalopathy, infection, head injury from fall, with history of cirrhosis also considered hepatic encephalopathy, worsening renal or liver function   On arrival pt is confused but nontoxic, vitals significant for hypertension, which patient has known history of and has likely not taken her medications, but vitals otherwise normal.   Additional history obtained from chart review and EMS. Previous records obtained and reviewed including most recent ED encounter in July with very similar presentation  I ordered medication Tdap for skin tear on the left elbow is patient is unsure of last tetanus vaccination, Tylenol, as patient became more alert  she complained of some pain and headache, given Tylenol with improvement  Lab Tests:  I Ordered, reviewed, and interpreted labs, which included: No leukocytosis, glucose of 256 but no other significant electrolyte derangements, normal renal and liver function normal lactic acid and ammonia, CK is not elevated, ethanol, acetaminophen and  salicylate levels negative, normal BNP, urinalysis and UDS not collected  Imaging Studies ordered:  I ordered imaging studies which included CT of the head and cervical spine as well as x-rays of the chest and left elbow, I independently visualized and interpreted imaging which showed no intracranial bleeding or mass, no evidence of injury to the C-spine, no fracture or malalignment.  Elbow x-rays negative for fracture or effusion and chest x-ray is clear  ED Course:   Patient's lab work has overall been unremarkable aside from mild hyperglycemia and TSH slightly low.  Not to the point that I would suggest thyroid syndrome contributing to patient's altered mental status.  Patient observed in the emergency department for several hours and has had gradually improving mental status, now returned to baseline.  As patient has had improvement in her confusion she is able to report that she thinks she likely took more than her usual amount of baclofen is unsure of the use of other substances.  I have also reassured her that this is almost identical to her prior presentation where she had similarly reassuring work-up and improved after several hours of observation as well.  Imaging without evidence of traumatic injury and CK is not elevated which does not suggest prolonged time on the ground.  I have counseled patient on using medications only as prescribed and avoiding combining these medications with drugs and alcohol.  At this point patient has returned to baseline and is stable and appropriate for discharge home.  Given that patient has returned to baseline do not feel that  pending urinalysis and UDS would change management at this time.  Patient is working on getting in contact with her roommate to get a ride home.  At this time there does not appear to be any evidence of an acute emergency medical condition requiring further emergent evaluation and the patient appears stable for discharge with appropriate outpatient follow up. Diagnosis and return precautions discussed with patient who verbalizes understanding and is agreeable to discharge.     Portions of this note were generated with Scientist, clinical (histocompatibility and immunogenetics). Dictation errors may occur despite best attempts at proofreading.          Final Clinical Impression(s) / ED Diagnoses Final diagnoses:  Altered mental status, unspecified altered mental status type    Rx / DC Orders ED Discharge Orders     None         Legrand Rams 05/12/22 0911    Derwood Kaplan, MD 05/12/22 2115

## 2022-08-29 ENCOUNTER — Other Ambulatory Visit: Payer: Self-pay

## 2022-08-29 ENCOUNTER — Emergency Department (HOSPITAL_COMMUNITY): Payer: 59

## 2022-08-29 ENCOUNTER — Inpatient Hospital Stay (HOSPITAL_COMMUNITY)
Admission: EM | Admit: 2022-08-29 | Discharge: 2022-09-04 | DRG: 917 | Disposition: A | Discharge status: Skilled Nursing Facility | Payer: 59 | Attending: Internal Medicine | Admitting: Internal Medicine

## 2022-08-29 DIAGNOSIS — R4701 Aphasia: Secondary | ICD-10-CM | POA: Diagnosis present

## 2022-08-29 DIAGNOSIS — E039 Hypothyroidism, unspecified: Secondary | ICD-10-CM | POA: Diagnosis present

## 2022-08-29 DIAGNOSIS — T428X1A Poisoning by antiparkinsonism drugs and other central muscle-tone depressants, accidental (unintentional), initial encounter: Secondary | ICD-10-CM | POA: Diagnosis not present

## 2022-08-29 DIAGNOSIS — E1165 Type 2 diabetes mellitus with hyperglycemia: Secondary | ICD-10-CM | POA: Diagnosis present

## 2022-08-29 DIAGNOSIS — Z8249 Family history of ischemic heart disease and other diseases of the circulatory system: Secondary | ICD-10-CM

## 2022-08-29 DIAGNOSIS — N3000 Acute cystitis without hematuria: Secondary | ICD-10-CM | POA: Diagnosis present

## 2022-08-29 DIAGNOSIS — Z8711 Personal history of peptic ulcer disease: Secondary | ICD-10-CM

## 2022-08-29 DIAGNOSIS — M19012 Primary osteoarthritis, left shoulder: Secondary | ICD-10-CM | POA: Diagnosis present

## 2022-08-29 DIAGNOSIS — F1190 Opioid use, unspecified, uncomplicated: Secondary | ICD-10-CM | POA: Diagnosis present

## 2022-08-29 DIAGNOSIS — M0600 Rheumatoid arthritis without rheumatoid factor, unspecified site: Secondary | ICD-10-CM | POA: Diagnosis present

## 2022-08-29 DIAGNOSIS — G9340 Encephalopathy, unspecified: Principal | ICD-10-CM

## 2022-08-29 DIAGNOSIS — Z96643 Presence of artificial hip joint, bilateral: Secondary | ICD-10-CM | POA: Diagnosis present

## 2022-08-29 DIAGNOSIS — T381X5A Adverse effect of thyroid hormones and substitutes, initial encounter: Secondary | ICD-10-CM | POA: Diagnosis present

## 2022-08-29 DIAGNOSIS — M069 Rheumatoid arthritis, unspecified: Secondary | ICD-10-CM | POA: Diagnosis present

## 2022-08-29 DIAGNOSIS — G8334 Monoplegia, unspecified affecting left nondominant side: Secondary | ICD-10-CM | POA: Diagnosis present

## 2022-08-29 DIAGNOSIS — I4891 Unspecified atrial fibrillation: Secondary | ICD-10-CM | POA: Diagnosis present

## 2022-08-29 DIAGNOSIS — R29810 Facial weakness: Secondary | ICD-10-CM | POA: Diagnosis present

## 2022-08-29 DIAGNOSIS — G928 Other toxic encephalopathy: Secondary | ICD-10-CM | POA: Diagnosis present

## 2022-08-29 DIAGNOSIS — Z833 Family history of diabetes mellitus: Secondary | ICD-10-CM

## 2022-08-29 DIAGNOSIS — I10 Essential (primary) hypertension: Secondary | ICD-10-CM | POA: Diagnosis present

## 2022-08-29 DIAGNOSIS — E1142 Type 2 diabetes mellitus with diabetic polyneuropathy: Secondary | ICD-10-CM | POA: Diagnosis present

## 2022-08-29 DIAGNOSIS — Z818 Family history of other mental and behavioral disorders: Secondary | ICD-10-CM

## 2022-08-29 DIAGNOSIS — F39 Unspecified mood [affective] disorder: Secondary | ICD-10-CM | POA: Diagnosis present

## 2022-08-29 DIAGNOSIS — G894 Chronic pain syndrome: Secondary | ICD-10-CM | POA: Diagnosis present

## 2022-08-29 DIAGNOSIS — Z7984 Long term (current) use of oral hypoglycemic drugs: Secondary | ICD-10-CM

## 2022-08-29 DIAGNOSIS — Z96653 Presence of artificial knee joint, bilateral: Secondary | ICD-10-CM | POA: Diagnosis present

## 2022-08-29 DIAGNOSIS — Z87891 Personal history of nicotine dependence: Secondary | ICD-10-CM

## 2022-08-29 DIAGNOSIS — Z880 Allergy status to penicillin: Secondary | ICD-10-CM

## 2022-08-29 DIAGNOSIS — Z89422 Acquired absence of other left toe(s): Secondary | ICD-10-CM

## 2022-08-29 DIAGNOSIS — Z9181 History of falling: Secondary | ICD-10-CM

## 2022-08-29 DIAGNOSIS — Z9189 Other specified personal risk factors, not elsewhere classified: Secondary | ICD-10-CM

## 2022-08-29 DIAGNOSIS — Z888 Allergy status to other drugs, medicaments and biological substances status: Secondary | ICD-10-CM

## 2022-08-29 DIAGNOSIS — G9341 Metabolic encephalopathy: Secondary | ICD-10-CM | POA: Diagnosis present

## 2022-08-29 DIAGNOSIS — F410 Panic disorder [episodic paroxysmal anxiety] without agoraphobia: Secondary | ICD-10-CM | POA: Diagnosis present

## 2022-08-29 DIAGNOSIS — K7460 Unspecified cirrhosis of liver: Secondary | ICD-10-CM | POA: Diagnosis present

## 2022-08-29 DIAGNOSIS — Z79899 Other long term (current) drug therapy: Secondary | ICD-10-CM

## 2022-08-29 DIAGNOSIS — F1490 Cocaine use, unspecified, uncomplicated: Secondary | ICD-10-CM | POA: Diagnosis present

## 2022-08-29 DIAGNOSIS — Z9049 Acquired absence of other specified parts of digestive tract: Secondary | ICD-10-CM

## 2022-08-29 DIAGNOSIS — F411 Generalized anxiety disorder: Secondary | ICD-10-CM | POA: Diagnosis present

## 2022-08-29 DIAGNOSIS — Z79890 Hormone replacement therapy (postmenopausal): Secondary | ICD-10-CM

## 2022-08-29 LAB — CBC WITH DIFFERENTIAL/PLATELET
Abs Immature Granulocytes: 0.02 10*3/uL (ref 0.00–0.07)
Basophils Absolute: 0 10*3/uL (ref 0.0–0.1)
Basophils Relative: 0 %
Eosinophils Absolute: 0 10*3/uL (ref 0.0–0.5)
Eosinophils Relative: 1 %
HCT: 40.4 % (ref 36.0–46.0)
Hemoglobin: 13.1 g/dL (ref 12.0–15.0)
Immature Granulocytes: 0 %
Lymphocytes Relative: 20 %
Lymphs Abs: 1.4 10*3/uL (ref 0.7–4.0)
MCH: 27.3 pg (ref 26.0–34.0)
MCHC: 32.4 g/dL (ref 30.0–36.0)
MCV: 84.2 fL (ref 80.0–100.0)
Monocytes Absolute: 0.3 10*3/uL (ref 0.1–1.0)
Monocytes Relative: 4 %
Neutro Abs: 5.3 10*3/uL (ref 1.7–7.7)
Neutrophils Relative %: 75 %
Platelets: 205 10*3/uL (ref 150–400)
RBC: 4.8 MIL/uL (ref 3.87–5.11)
RDW: 15.3 % (ref 11.5–15.5)
WBC: 7 10*3/uL (ref 4.0–10.5)
nRBC: 0 % (ref 0.0–0.2)

## 2022-08-29 LAB — CBG MONITORING, ED: Glucose-Capillary: 165 mg/dL — ABNORMAL HIGH (ref 70–99)

## 2022-08-29 LAB — MAGNESIUM: Magnesium: 1.7 mg/dL (ref 1.7–2.4)

## 2022-08-29 MED ORDER — SODIUM CHLORIDE 0.9 % IV BOLUS
1000.0000 mL | Freq: Once | INTRAVENOUS | Status: AC
  Administered 2022-08-29: 1000 mL via INTRAVENOUS

## 2022-08-29 MED ORDER — SODIUM CHLORIDE 0.9 % IV SOLN: INTRAVENOUS | Status: DC

## 2022-08-29 NOTE — ED Provider Notes (Signed)
[facility] Provider Note   CSN: [id number] Arrival date & time: 08/29/22  2228     History {Add pertinent medical, surgical, social history, OB history to HPI:1} Chief Complaint  Patient presents with   Drug Overdose    Taylor Benitez is a 62 y.o. female.  Pt is a 62 yo female with pmhx significant for htn, dm2, hx sbo, liver cirrhosis, oa, hypothyroidism, ra, polysubstance abuse, and peripheral neuropathy.  Pt was last seen by family intoxicated last night (27 hrs ago according to EMS).  The roommate's son called EMS b/c she had not been seen.  Pt was in her room with pills and ants all around her.  (House was not clean according to EMS).  Pt had 46 pills missing from her baclofen bottle.  It was filled a month ago; however.  She takes 10 mg bid.  Pt is awake and alert, but is only answering yes/no.  She is not squeezing my hand on the left. She is moving left foot.  Unable to assess vision.       Home Medications Prior to Admission medications   Medication Sig Start Date End Date Taking? Authorizing Provider  amitriptyline (ELAVIL) 100 MG tablet TAKE 2 TABLETS BY MOUTH AT BEDTIME Patient taking differently: Take 200 mg by mouth. 06/14/21   Tower, Wynelle [name], MD  baclofen (LIORESAL) 10 MG tablet Take 10 mg by mouth 2 (two) times daily as needed for muscle spasms.    [provider]  celecoxib (CELEBREX) 100 MG capsule Take 100 mg by mouth 2 (two) times daily.    [provider]  cephALEXin (KEFLEX) 500 MG capsule Take 1 capsule (500 mg total) by mouth 4 (four) times daily. 04/01/22   Valarie Merino, MD  Cholecalciferol (VITAMIN D3) 1.25 MG (50000 UT) TABS Take 1 tablet by mouth every 7 (seven) days. Patient taking differently: Take 1 tablet by mouth every 7 (seven) days. Monday's 12/19/19   [name], FNP  empagliflozin (JARDIANCE) 25 MG TABS tablet Take 25 mg by mouth daily.    [provider]   hydrOXYzine (ATARAX/VISTARIL) 25 MG tablet TAKE 1/2 TO 1 TABLET BY MOUTH EVERY 8 HOURS AS NEEDED FOR ANXETY Patient not taking: Reported on 07/30/2021 10/18/20   Tower, Wynelle [name], MD  levothyroxine (SYNTHROID) 150 MCG tablet Take 1 tablet (150 mcg total) by mouth daily. Patient taking differently: Take 150 mcg by mouth daily. 08/09/20   Tower, Wynelle [name], MD  metFORMIN (GLUMETZA) 1000 MG (MOD) 24 hr tablet Take 1,000 mg by mouth 2 (two) times daily with a meal.    [provider]  Multiple Vitamins-Minerals (MULTIVITAMIN WOMEN PO) Take by mouth daily.    [provider]  oxyCODONE (OXY IR/ROXICODONE) 5 MG immediate release tablet Take 5 mg by mouth 3 (three) times daily as needed for severe pain.    [provider]      Allergies    Glipizide, Metformin and related, and Penicillins    Review of Systems   Review of Systems  Unable to perform ROS: Patient nonverbal  All other systems reviewed and are negative.   Physical Exam Updated Vital Signs BP (!) 149/77   Pulse 91   Temp 98.2 F (36.8 C) (Oral)   Resp 20   Ht '5\' 1"'$  (1.549 m)   Wt 63.5 kg   SpO2 98%   BMI 26.45 kg/m  Physical Exam Vitals and nursing note reviewed.  Constitutional:      Appearance: Normal appearance.  HENT:     Head: Normocephalic and atraumatic.     Right Ear: External ear normal.     Left Ear: External ear normal.     Nose: Nose normal.     Mouth/Throat:     Mouth: Mucous membranes are dry.  Eyes:     Extraocular Movements: Extraocular movements intact.     Conjunctiva/sclera: Conjunctivae normal.     Pupils: Pupils are equal, round, and reactive to light.  Cardiovascular:     Rate and Rhythm: Normal rate and regular rhythm.     Pulses: Normal pulses.     Heart sounds: Normal heart sounds.  Pulmonary:     Effort: Pulmonary effort is normal.     Breath sounds: Normal breath sounds.  Abdominal:     General: Abdomen is flat. Bowel sounds are normal.     Palpations: Abdomen is  soft.  Musculoskeletal:        General: Normal range of motion.     Cervical back: Normal range of motion and neck supple.  Skin:    General: Skin is warm.     Capillary Refill: Capillary refill takes less than 2 seconds.  Neurological:     Mental Status: She is alert.     Comments: Pt only able to answer yes/no; no left hand squeeze     ED Results / Procedures / Treatments   Labs (all labs ordered are listed, but only abnormal results are displayed) Labs Reviewed  COMPREHENSIVE METABOLIC PANEL  SALICYLATE LEVEL  ACETAMINOPHEN LEVEL  ETHANOL  RAPID URINE DRUG SCREEN, HOSP PERFORMED  CBC WITH DIFFERENTIAL/PLATELET  CK  URINALYSIS, ROUTINE W REFLEX MICROSCOPIC  TSH  MAGNESIUM  CBG MONITORING, ED    EKG EKG Interpretation  Date/Time:  Friday August 29 2022 22:33:46 EST Ventricular Rate:  90 PR Interval:    QRS Duration: 98 QT Interval:  409 QTC Calculation: 501 R Axis:   231 Text Interpretation: Probable right ventricular hypertrophy Prolonged QT interval No significant change since last tracing Confirmed by Isla Pence [phone]) on 08/29/2022 11:01:05 PM  Radiology CT HEAD WO CONTRAST  Result Date: 08/29/2022 CLINICAL DATA:  Neuro deficit. EXAM: CT HEAD WITHOUT CONTRAST TECHNIQUE: Contiguous axial images were obtained from the base of the skull through the vertex without intravenous contrast. RADIATION DOSE REDUCTION: This exam was performed according to the departmental dose-optimization program which includes automated exposure control, adjustment of the mA and/or kV according to patient size and/or use of iterative reconstruction technique. COMPARISON:  Head CT 04/01/2022 FINDINGS: Brain: No evidence of acute infarction, hemorrhage, hydrocephalus, extra-axial collection or mass lesion/mass effect. Vascular: No hyperdense vessel or unexpected calcification. Skull: Normal. Negative for fracture or focal lesion. Sinuses/Orbits: No acute finding. There has an old left medial  orbital wall fracture. Other: None. IMPRESSION: No acute intracranial abnormality. Electronically Signed   By: Ronney Asters M.D.   On: 08/29/2022 23:05   DG Chest Port 1 View  Result Date: 08/29/2022 CLINICAL DATA:  Overdose. EXAM: PORTABLE CHEST 1 VIEW COMPARISON:  Chest radiograph dated 04/01/2022. FINDINGS: No focal consolidation, pleural effusion, pneumothorax. The cardiac silhouette is within limits. No acute osseous pathology. Degenerative changes of the spine and shoulders. IMPRESSION: No active disease. Electronically Signed   By: Anner Crete M.D.   On: 08/29/2022 23:00    Procedures Procedures  {Document cardiac monitor, telemetry assessment procedure when appropriate:1}  Medications Ordered in ED Medications  sodium chloride 0.9 %  bolus 1,000 mL (has no administration in time range)    And  0.9 %  sodium chloride infusion (has no administration in time range)    ED Course/ Medical Decision Making/ A&P   {   Click here for ABCD2, HEART and other calculatorsREFRESH Note before signing :1}                          Medical Decision Making Amount and/or Complexity of Data Reviewed Labs: ordered. Radiology: ordered.  Risk Prescription drug management.   This patient presents to the ED for concern of ams, this involves an extensive number of treatment options, and is a complaint that carries with it a high risk of complications and morbidity.  The differential diagnosis includes drug od, infection, cva   Co morbidities that complicate the patient evaluation  htn, dm2, hx sbo, liver cirrhosis, oa, hypothyroidism, ra, polysubstance abuse, and peripheral neuropathy.   Additional history obtained:  Additional history obtained from epic chart review External records from outside source obtained and reviewed including EMS report   Lab Tests:  I Ordered, and personally interpreted labs.  The pertinent results include:  ***   Imaging Studies ordered:  I ordered  imaging studies including cxr and ct head  I independently visualized and interpreted imaging which showed  CXR: No active disease.  CT head: No acute intracranial abnormality.  I agree with the radiologist interpretation   Cardiac Monitoring:  The patient was maintained on a cardiac monitor.  I personally viewed and interpreted the cardiac monitored which showed an underlying rhythm of: nsr   Medicines ordered and prescription drug management:  I ordered medication including IVFs  for dehydration  Reevaluation of the patient after these medicines showed that the patient improved I have reviewed the patients home medicines and have made adjustments as needed   Test Considered:  ***   Critical Interventions:  ***   Consultations Obtained:  I requested consultation with the ***,  and discussed lab and imaging findings as well as pertinent plan - they recommend: ***   Problem List / ED Course:  ***   Reevaluation:  After the interventions noted above, I reevaluated the patient and found that they have :{resolved/improved/worsened:23923::"improved"}   Social Determinants of Health:  ***   Dispostion:  After consideration of the diagnostic results and the patients response to treatment, I feel that the patent would benefit from ***.    {Document critical care time when appropriate:1} {Document review of labs and clinical decision tools ie heart score, Chads2Vasc2 etc:1}  {Document your independent review of radiology images, and any outside records:1} {Document your discussion with family members, caretakers, and with consultants:1} {Document social determinants of health affecting pt's care:1} {Document your decision making why or why not admission, treatments were needed:1} Final Clinical Impression(s) / ED Diagnoses Final diagnoses:  None    Rx / DC Orders ED Discharge Orders     None

## 2022-08-29 NOTE — ED Triage Notes (Signed)
Pt BIB by [facility] d/t possible OD on Baclofen.  46 pills were missing,  Roommate's son called - last seen normal 27 hours ago.  Pt had pills and ants around her.

## 2022-08-29 NOTE — ED Provider Notes (Signed)
11:42 PM Assumed care from Dr. Gilford Raid, please see their note for full history, physical and decision making until this point. In brief this is a 62 y.o. year old female who presented to the ED tonight with Drug Overdose     Possibly overdose on baclofen? But also with left sided paresis. Has some aphasia as well. LKN 27 hours ago. Pending workup for cva.   She is still altered.  Warm to touch will check a rectal temperature.  Urine still has not been collected from many hours ago so we will ask for in and out catheter and give her some fluids as well.  Will add on ammonia and discussed with neurology.  She will need to be admitted to question be whether or not she needs any more neurologic workup or if they think is more of a infectious/metabolic encephalopathy.  There is still the question of possible drug overdose however does not really fit the pill count nor the presentation.  Overall found to have likely UTI, abx/fluids/urine cx initiated. Still altered on my exam. Rectal temp ok. Discussed with [facility] for admission.   CRITICAL CARE Performed by: Merrily Pew Total critical care time: 35 minutes Critical care time was exclusive of separately billable procedures and treating other patients. Critical care was necessary to treat or prevent imminent or life-threatening deterioration. Critical care was time spent personally by me on the following activities: development of treatment plan with patient and/or surrogate as well as nursing, discussions with consultants, evaluation of patient's response to treatment, examination of patient, obtaining history from patient or surrogate, ordering and performing treatments and interventions, ordering and review of laboratory studies, ordering and review of radiographic studies, pulse oximetry and re-evaluation of patient's condition.   Labs, studies and imaging reviewed by myself and considered in medical decision making if ordered. Imaging interpreted by  radiology.  Labs Reviewed  CBG MONITORING, ED - Abnormal; Notable for the following components:      Result Value   Glucose-Capillary 165 (*)    All other components within normal limits  CBC WITH DIFFERENTIAL/PLATELET  COMPREHENSIVE METABOLIC PANEL  SALICYLATE LEVEL  ACETAMINOPHEN LEVEL  ETHANOL  RAPID URINE DRUG SCREEN, HOSP PERFORMED  CK  URINALYSIS, ROUTINE W REFLEX MICROSCOPIC  TSH  MAGNESIUM    CT HEAD WO CONTRAST  Final Result    DG Chest Port 1 View  Final Result    MR BRAIN WO CONTRAST    (Results Pending)    No follow-ups on file.    Natalie Leclaire, Corene Cornea, MD 08/31/22 [phone]

## 2022-08-30 ENCOUNTER — Emergency Department (HOSPITAL_COMMUNITY): Payer: 59

## 2022-08-30 ENCOUNTER — Inpatient Hospital Stay (HOSPITAL_COMMUNITY): Payer: 59

## 2022-08-30 ENCOUNTER — Encounter (HOSPITAL_COMMUNITY): Payer: Self-pay | Admitting: Family Medicine

## 2022-08-30 DIAGNOSIS — Z79899 Other long term (current) drug therapy: Secondary | ICD-10-CM | POA: Diagnosis not present

## 2022-08-30 DIAGNOSIS — Z9189 Other specified personal risk factors, not elsewhere classified: Secondary | ICD-10-CM

## 2022-08-30 DIAGNOSIS — R29810 Facial weakness: Secondary | ICD-10-CM | POA: Diagnosis present

## 2022-08-30 DIAGNOSIS — F411 Generalized anxiety disorder: Secondary | ICD-10-CM | POA: Diagnosis present

## 2022-08-30 DIAGNOSIS — Z87891 Personal history of nicotine dependence: Secondary | ICD-10-CM | POA: Diagnosis not present

## 2022-08-30 DIAGNOSIS — I4891 Unspecified atrial fibrillation: Secondary | ICD-10-CM | POA: Diagnosis present

## 2022-08-30 DIAGNOSIS — E1165 Type 2 diabetes mellitus with hyperglycemia: Secondary | ICD-10-CM

## 2022-08-30 DIAGNOSIS — G894 Chronic pain syndrome: Secondary | ICD-10-CM | POA: Diagnosis present

## 2022-08-30 DIAGNOSIS — R4701 Aphasia: Secondary | ICD-10-CM | POA: Diagnosis present

## 2022-08-30 DIAGNOSIS — I10 Essential (primary) hypertension: Secondary | ICD-10-CM | POA: Diagnosis present

## 2022-08-30 DIAGNOSIS — K7460 Unspecified cirrhosis of liver: Secondary | ICD-10-CM | POA: Diagnosis present

## 2022-08-30 DIAGNOSIS — M069 Rheumatoid arthritis, unspecified: Secondary | ICD-10-CM | POA: Diagnosis present

## 2022-08-30 DIAGNOSIS — T428X1A Poisoning by antiparkinsonism drugs and other central muscle-tone depressants, accidental (unintentional), initial encounter: Secondary | ICD-10-CM | POA: Diagnosis present

## 2022-08-30 DIAGNOSIS — Z96643 Presence of artificial hip joint, bilateral: Secondary | ICD-10-CM | POA: Diagnosis present

## 2022-08-30 DIAGNOSIS — M0600 Rheumatoid arthritis without rheumatoid factor, unspecified site: Secondary | ICD-10-CM | POA: Diagnosis present

## 2022-08-30 DIAGNOSIS — G9340 Encephalopathy, unspecified: Secondary | ICD-10-CM | POA: Diagnosis present

## 2022-08-30 DIAGNOSIS — F1490 Cocaine use, unspecified, uncomplicated: Secondary | ICD-10-CM | POA: Diagnosis not present

## 2022-08-30 DIAGNOSIS — T381X5A Adverse effect of thyroid hormones and substitutes, initial encounter: Secondary | ICD-10-CM | POA: Diagnosis present

## 2022-08-30 DIAGNOSIS — E1142 Type 2 diabetes mellitus with diabetic polyneuropathy: Secondary | ICD-10-CM | POA: Diagnosis present

## 2022-08-30 DIAGNOSIS — M19012 Primary osteoarthritis, left shoulder: Secondary | ICD-10-CM | POA: Diagnosis present

## 2022-08-30 DIAGNOSIS — G9341 Metabolic encephalopathy: Secondary | ICD-10-CM | POA: Diagnosis not present

## 2022-08-30 DIAGNOSIS — N3000 Acute cystitis without hematuria: Secondary | ICD-10-CM | POA: Diagnosis present

## 2022-08-30 DIAGNOSIS — Z96653 Presence of artificial knee joint, bilateral: Secondary | ICD-10-CM | POA: Diagnosis present

## 2022-08-30 DIAGNOSIS — R4182 Altered mental status, unspecified: Secondary | ICD-10-CM

## 2022-08-30 DIAGNOSIS — G8334 Monoplegia, unspecified affecting left nondominant side: Secondary | ICD-10-CM | POA: Diagnosis present

## 2022-08-30 DIAGNOSIS — Z8249 Family history of ischemic heart disease and other diseases of the circulatory system: Secondary | ICD-10-CM | POA: Diagnosis not present

## 2022-08-30 DIAGNOSIS — F39 Unspecified mood [affective] disorder: Secondary | ICD-10-CM | POA: Diagnosis present

## 2022-08-30 DIAGNOSIS — Z794 Long term (current) use of insulin: Secondary | ICD-10-CM

## 2022-08-30 DIAGNOSIS — G928 Other toxic encephalopathy: Secondary | ICD-10-CM | POA: Diagnosis present

## 2022-08-30 DIAGNOSIS — E039 Hypothyroidism, unspecified: Secondary | ICD-10-CM | POA: Diagnosis present

## 2022-08-30 LAB — T4, FREE: Free T4: 1.22 ng/dL — ABNORMAL HIGH (ref 0.61–1.12)

## 2022-08-30 LAB — COMPREHENSIVE METABOLIC PANEL
ALT: 21 U/L (ref 0–44)
AST: 21 U/L (ref 15–41)
Albumin: 3.8 g/dL (ref 3.5–5.0)
Alkaline Phosphatase: 75 U/L (ref 38–126)
Anion gap: 11 (ref 5–15)
BUN: 15 mg/dL (ref 8–23)
CO2: 24 mmol/L (ref 22–32)
Calcium: 9.5 mg/dL (ref 8.9–10.3)
Chloride: 105 mmol/L (ref 98–111)
Creatinine, Ser: 1.11 mg/dL — ABNORMAL HIGH (ref 0.44–1.00)
GFR, Estimated: 56 mL/min — ABNORMAL LOW (ref >60)
Glucose, Bld: 169 mg/dL — ABNORMAL HIGH (ref 70–99)
Potassium: 4.1 mmol/L (ref 3.5–5.1)
Sodium: 140 mmol/L (ref 135–145)
Total Bilirubin: 0.9 mg/dL (ref 0.3–1.2)
Total Protein: 7.1 g/dL (ref 6.5–8.1)

## 2022-08-30 LAB — URINALYSIS, ROUTINE W REFLEX MICROSCOPIC
Bilirubin Urine: NEGATIVE
Glucose, UA: 50 mg/dL — AB
Hgb urine dipstick: NEGATIVE
Ketones, ur: NEGATIVE mg/dL
Nitrite: NEGATIVE
Protein, ur: NEGATIVE mg/dL
Specific Gravity, Urine: 1.011 (ref 1.005–1.030)
WBC, UA: >50 WBC/hpf (ref 0–5)
pH: 5 (ref 5.0–8.0)

## 2022-08-30 LAB — LACTIC ACID, PLASMA: Lactic Acid, Venous: 0.9 mmol/L (ref 0.5–1.9)

## 2022-08-30 LAB — HIV ANTIBODY (ROUTINE TESTING W REFLEX): HIV Screen 4th Generation wRfx: NONREACTIVE

## 2022-08-30 LAB — TSH: TSH: 0.034 u[IU]/mL — ABNORMAL LOW (ref 0.350–4.500)

## 2022-08-30 LAB — RAPID URINE DRUG SCREEN, HOSP PERFORMED
Amphetamines: NOT DETECTED
Barbiturates: NOT DETECTED
Benzodiazepines: NOT DETECTED
Cocaine: POSITIVE — AB
Opiates: NOT DETECTED
Tetrahydrocannabinol: NOT DETECTED

## 2022-08-30 LAB — SALICYLATE LEVEL: Salicylate Lvl: <7 mg/dL — ABNORMAL LOW (ref 7.0–30.0)

## 2022-08-30 LAB — CBG MONITORING, ED
Glucose-Capillary: 140 mg/dL — ABNORMAL HIGH (ref 70–99)
Glucose-Capillary: 113 mg/dL — ABNORMAL HIGH (ref 70–99)
Glucose-Capillary: 134 mg/dL — ABNORMAL HIGH (ref 70–99)

## 2022-08-30 LAB — LIPID PANEL
Cholesterol: 139 mg/dL (ref 0–200)
HDL: 26 mg/dL — ABNORMAL LOW (ref >40)
LDL Cholesterol: 70 mg/dL (ref 0–99)
Total CHOL/HDL Ratio: 5.3 RATIO
Triglycerides: 213 mg/dL — ABNORMAL HIGH (ref <150)
VLDL: 43 mg/dL — ABNORMAL HIGH (ref 0–40)

## 2022-08-30 LAB — GLUCOSE, CAPILLARY
Glucose-Capillary: 175 mg/dL — ABNORMAL HIGH (ref 70–99)
Glucose-Capillary: 134 mg/dL — ABNORMAL HIGH (ref 70–99)

## 2022-08-30 LAB — ACETAMINOPHEN LEVEL: Acetaminophen (Tylenol), Serum: <10 ug/mL — ABNORMAL LOW (ref 10–30)

## 2022-08-30 LAB — FOLATE: Folate: 25 ng/mL (ref >5.9)

## 2022-08-30 LAB — CK: Total CK: 21 U/L — ABNORMAL LOW (ref 38–234)

## 2022-08-30 LAB — AMMONIA: Ammonia: <10 umol/L (ref 9–35)

## 2022-08-30 LAB — ETHANOL: Alcohol, Ethyl (B): <10 mg/dL (ref <10)

## 2022-08-30 LAB — VITAMIN B12: Vitamin B-12: 365 pg/mL (ref 180–914)

## 2022-08-30 MED ORDER — SODIUM CHLORIDE 0.9 % IV SOLN
2.0000 g | Freq: Once | INTRAVENOUS | Status: AC
  Administered 2022-08-30: 2 g via INTRAVENOUS
  Filled 2022-08-30: qty 20

## 2022-08-30 MED ORDER — SODIUM CHLORIDE 0.9 % IV SOLN: 1.0000 g | INTRAVENOUS | Status: DC

## 2022-08-30 MED ORDER — IOHEXOL 350 MG/ML SOLN
75.0000 mL | Freq: Once | INTRAVENOUS | Status: AC | PRN
  Administered 2022-08-30: 75 mL via INTRAVENOUS

## 2022-08-30 MED ORDER — APIXABAN 5 MG PO TABS: 5.0000 mg | ORAL_TABLET | Freq: Two times a day (BID) | ORAL | Status: DC

## 2022-08-30 MED ORDER — ONDANSETRON HCL 4 MG/2ML IJ SOLN: 4.0000 mg | Freq: Four times a day (QID) | INTRAMUSCULAR | Status: DC | PRN

## 2022-08-30 MED ORDER — ACETAMINOPHEN 325 MG PO TABS
650.0000 mg | ORAL_TABLET | ORAL | Status: DC | PRN
  Administered 2022-08-30: 650 mg via ORAL
  Filled 2022-08-30: qty 2

## 2022-08-30 MED ORDER — ACETAMINOPHEN 650 MG RE SUPP: 650.0000 mg | Freq: Four times a day (QID) | RECTAL | Status: DC | PRN

## 2022-08-30 MED ORDER — ACETAMINOPHEN 325 MG PO TABS: 650.0000 mg | ORAL_TABLET | Freq: Four times a day (QID) | ORAL | Status: DC | PRN

## 2022-08-30 MED ORDER — SENNOSIDES-DOCUSATE SODIUM 8.6-50 MG PO TABS: 1.0000 | ORAL_TABLET | Freq: Every evening | ORAL | Status: DC | PRN

## 2022-08-30 MED ORDER — THIAMINE MONONITRATE 100 MG PO TABS: 100.0000 mg | ORAL_TABLET | Freq: Every day | ORAL | Status: DC

## 2022-08-30 MED ORDER — LACTATED RINGERS IV SOLN: INTRAVENOUS | Status: DC

## 2022-08-30 MED ORDER — ENOXAPARIN SODIUM 40 MG/0.4ML IJ SOSY: 40.0000 mg | PREFILLED_SYRINGE | Freq: Every day | INTRAMUSCULAR | Status: DC

## 2022-08-30 MED ORDER — INSULIN ASPART 100 UNIT/ML IJ SOLN: 0.0000 [IU] | Freq: Every day | INTRAMUSCULAR | Status: DC

## 2022-08-30 MED ORDER — ONDANSETRON HCL 4 MG PO TABS: 4.0000 mg | ORAL_TABLET | Freq: Four times a day (QID) | ORAL | Status: DC | PRN

## 2022-08-30 MED ORDER — STROKE: EARLY STAGES OF RECOVERY BOOK
Freq: Once | Status: AC
  Filled 2022-08-30: qty 1

## 2022-08-30 MED ORDER — ACETAMINOPHEN 160 MG/5ML PO SOLN: 650.0000 mg | ORAL | Status: DC | PRN

## 2022-08-30 MED ORDER — METOPROLOL TARTRATE 5 MG/5ML IV SOLN
5.0000 mg | INTRAVENOUS | Status: DC | PRN
  Filled 2022-08-30: qty 5

## 2022-08-30 MED ORDER — THIAMINE HCL 100 MG/ML IJ SOLN
500.0000 mg | Freq: Three times a day (TID) | INTRAVENOUS | Status: AC
  Administered 2022-08-30 – 2022-09-02 (×9): 500 mg via INTRAVENOUS
  Filled 2022-08-30 (×10): qty 5

## 2022-08-30 MED ORDER — INSULIN ASPART 100 UNIT/ML IJ SOLN
0.0000 [IU] | Freq: Three times a day (TID) | INTRAMUSCULAR | Status: DC
  Administered 2022-08-30: 2 [IU] via SUBCUTANEOUS
  Administered 2022-08-31: 3 [IU] via SUBCUTANEOUS
  Administered 2022-08-31: 11 [IU] via SUBCUTANEOUS
  Administered 2022-08-31: 5 [IU] via SUBCUTANEOUS
  Administered 2022-09-01 (×2): 11 [IU] via SUBCUTANEOUS
  Administered 2022-09-02: 3 [IU] via SUBCUTANEOUS
  Administered 2022-09-02 – 2022-09-03 (×3): 5 [IU] via SUBCUTANEOUS
  Administered 2022-09-03: 2 [IU] via SUBCUTANEOUS
  Administered 2022-09-03: 3 [IU] via SUBCUTANEOUS
  Administered 2022-09-04: 2 [IU] via SUBCUTANEOUS
  Administered 2022-09-04: 3 [IU] via SUBCUTANEOUS

## 2022-08-30 MED ORDER — SODIUM CHLORIDE 0.9 % IV SOLN
1.0000 g | INTRAVENOUS | Status: DC
  Filled 2022-08-30: qty 10

## 2022-08-30 MED ORDER — LACTATED RINGERS IV BOLUS
1000.0000 mL | Freq: Once | INTRAVENOUS | Status: AC
  Administered 2022-08-30: 1000 mL via INTRAVENOUS

## 2022-08-30 MED ORDER — THIAMINE HCL 100 MG/ML IJ SOLN
250.0000 mg | Freq: Every day | INTRAVENOUS | Status: DC
  Administered 2022-09-03 – 2022-09-04 (×2): 250 mg via INTRAVENOUS
  Filled 2022-08-30 (×2): qty 2.5

## 2022-08-30 MED ORDER — ACETAMINOPHEN 650 MG RE SUPP: 650.0000 mg | RECTAL | Status: DC | PRN

## 2022-08-30 MED ORDER — FOLIC ACID 1 MG PO TABS
1.0000 mg | ORAL_TABLET | Freq: Every day | ORAL | Status: DC
  Administered 2022-08-30 – 2022-09-04 (×6): 1 mg via ORAL
  Filled 2022-08-30 (×6): qty 1

## 2022-08-30 NOTE — Progress Notes (Addendum)
PROGRESS NOTE    MEL MALCOM  [id number] DOB: 1960/12/01 DOA: 08/29/2022 PCP: Abner Greenspan, MD  Chief Complaint  Patient presents with   Drug Overdose    Brief Narrative:    62 year old female with past medical history of chronic narcotic use/chronic pain, liver cirrhosis, cocaine abuse, rheumatoid arthritis, hypothyroidism, HTN, diabetes mellitus, panic attacks, and history of overdose.  Today she was to have met up with Viera Okonski friend, when she no-showed, her friend went to the house, where the patient was found down. She was never unresponsive just very confused. show.  No one had heard from her in over 24 hours. In the house there were several empty bottles of baclofen laying on the ground.  On presentation to the ER she had left sided facial droop and weakness in the left arm and leg.  Code stroke was not called as there was no last known normal.  Assessment & Plan:   Principal Problem:   Acute metabolic encephalopathy Active Problems:   Type 2 diabetes mellitus with hyperglycemia (HCC)   H/O drug overdose   Essential hypertension   Chronic narcotic use   Anxiety attack   Acute cystitis   Cocaine use  Acute Toxic and Metabolic Encephalopathy  Presumed Baclofen Overdose Based on hx and collateral from friends - suspect this is related to overdose (baclofen most likely given found down with pills around - elavil also on med list.  Last percocet fill 2/7, per report, she was out of this.  Utox positive only for cocaine.  Binge drinker when she has money for etoh, about 2x Keeva Reisen month).  On review of prior ED visits, she's got Haaris Metallo hx of abusing pills (01/01/2022 ED visit). MRI brain without acute intracranial process Will follow EEG Supportive care for now Will review what's been recently filled with pharmacy to see what she could have overdosed on - most likely candidates now seem like baclofen (+/- amitriptyline?)  Negative ammonia.  B12, RPR, folate pending. B1 level.   High dose  thiamine Delirium precautions Denied suicidal ideation to me or intent to self harm.  Will consider psych c/s when able to participate more in interview. Pyuria on UA, follow urine culture, continue ceftriaxone for now Follow blood cultures PT/OT when able to participate Addendum - spoke to her additionally, thinks she overdosed on baclofen, isn't able to tell me specifics on how much she took or why she took.  Again denies SI or intent to harm herself.   Concern for atrial fibrillation EKG poor quality with artifact, but P waves noted Will review telemetry Hold eliquis, anticoagulation in this patient with high risk of falls with frequent presentations for overdose would be high risk  Hypothyroidism Abnormal thyroid function tests - may need reduction in dose of synthroid Will discuss with patient and follow  T2DM Holding metformin and jardiance SSI  Chronic Pain Last percocet fill was 2/7. Current taking baclofen  Polysubstance Abuse Cocaine, pain meds Encourage cessation    DVT prophylaxis: eliquis Code Status: full Family Communication: Manuela Schwartz [phone] (roommate), also spoke with Clarene Critchley Disposition:   Status is: Inpatient Remains inpatient appropriate because: continued need for inpatient treatment   Consultants:  none  Procedures:  none  Antimicrobials:  Anti-infectives (From admission, onward)    Start     Dose/Rate Route Frequency Ordered Stop   08/31/22 0800  cefTRIAXone (ROCEPHIN) 1 g in sodium chloride 0.9 % 100 mL IVPB        1 g 200 mL/hr  over 30 Minutes Intravenous Every 24 hours 08/30/22 0627     08/30/22 0630  cefTRIAXone (ROCEPHIN) 1 g in sodium chloride 0.9 % 100 mL IVPB  Status:  Discontinued        1 g 200 mL/hr over 30 Minutes Intravenous Every 24 hours 08/30/22 0627 08/30/22 0627   08/30/22 0600  cefTRIAXone (ROCEPHIN) 2 g in sodium chloride 0.9 % 100 mL IVPB        2 g 200 mL/hr over 30 Minutes Intravenous  Once 08/30/22 0555 08/30/22  0652       Subjective: No complaints Says I don't know in response to most questions  Objective: Vitals:   08/30/22 0246 08/30/22 0500 08/30/22 0506 08/30/22 0615  BP:  (!) 141/81  (!) 151/73  Pulse:  96  87  Resp:  14  15  Temp: 98.3 F (36.8 C)  99.3 F (37.4 C)   TempSrc: Oral  Rectal   SpO2:  92%  98%  Weight:      Height:        Intake/Output Summary (Last 24 hours) at 08/30/2022 0845 Last data filed at 08/30/2022 0801 Gross per 24 hour  Intake 3133.42 ml  Output --  Net 3133.42 ml   Filed Weights   08/29/22 2236  Weight: 63.5 kg    Examination:  General exam: Appears calm and comfortable  Respiratory system: unlabored Cardiovascular system: RRR Gastrointestinal system: Abdomen is nondistended, soft and nontender. Central nervous system: Alert, but disoriented - doesn't know month or day - knows location - can't tell me what happened - moving all extremities - answers most questions with I don't recall Extremities: no LEE   Data Reviewed: I have personally reviewed following labs and imaging studies  CBC: Recent Labs  Lab 08/29/22 2310  WBC 7.0  NEUTROABS 5.3  HGB 13.1  HCT 40.4  MCV 84.2  PLT 99991111    Basic Metabolic Panel: Recent Labs  Lab 08/29/22 2310  NA 140  K 4.1  CL 105  CO2 24  GLUCOSE 169*  BUN 15  CREATININE 1.11*  CALCIUM 9.5  MG 1.7    GFR: Estimated Creatinine Clearance: 44.9 mL/min (Janace Decker) (by C-G formula based on SCr of 1.11 mg/dL (H)).  Liver Function Tests: Recent Labs  Lab 08/29/22 2310  AST 21  ALT 21  ALKPHOS 75  BILITOT 0.9  PROT 7.1  ALBUMIN 3.8    CBG: Recent Labs  Lab 08/29/22 2323 08/30/22 0802  GLUCAP 165* 140*     No results found for this or any previous visit (from the past 240 hour(s)).       Radiology Studies: MR BRAIN WO CONTRAST  Result Date: 08/30/2022 CLINICAL DATA:  Stroke suspected EXAM: MRI HEAD WITHOUT CONTRAST TECHNIQUE: Multiplanar, multiecho pulse sequences of the brain and  surrounding structures were obtained without intravenous contrast. COMPARISON:  No prior MRI available, correlation is made with CT head 08/29/2022 FINDINGS: Brain: No restricted diffusion to suggest acute or subacute infarct. No acute hemorrhage, mass, mass effect, or midline shift. No hydrocephalus or extra-axial collection. Normal craniocervical junction. Focus of susceptibility in the left frontal lobe (series 12, image 37), which may represent the sequela of Dorise Gangi prior microhemorrhage. Scattered T2 hyperintense signal in the periventricular white matter, likely the sequela of mild chronic small vessel ischemic disease. Vascular: Normal arterial flow voids. Skull and upper cervical spine: Normal marrow signal. Sinuses/Orbits: Small mucous retention cyst in the anterior left ethmoid air cells. Otherwise clear paranasal sinuses. No  acute finding in the orbits. Other: Trace fluid in right mastoid air cells. IMPRESSION: No acute intracranial process. No evidence of acute or subacute infarct. Electronically Signed   By: Merilyn Baba M.D.   On: 08/30/2022 02:17   DG Abdomen 1 View  Result Date: 08/30/2022 CLINICAL DATA:  Screening for MRI.  Possible overdose on baclofen. EXAM: ABDOMEN - 1 VIEW COMPARISON:  None Available. FINDINGS: The bowel gas pattern is normal. No radio-opaque calculi or other significant radiographic abnormality are seen. Overlying telemetry leads. No radiopaque foreign body. Bilateral THAs. IMPRESSION: 1. No radiopaque foreign body which would be Abdiel Blackerby contraindication to MRI. Electronically Signed   By: Placido Sou M.D.   On: 08/30/2022 01:14   CT HEAD WO CONTRAST  Result Date: 08/29/2022 CLINICAL DATA:  Neuro deficit. EXAM: CT HEAD WITHOUT CONTRAST TECHNIQUE: Contiguous axial images were obtained from the base of the skull through the vertex without intravenous contrast. RADIATION DOSE REDUCTION: This exam was performed according to the departmental dose-optimization program which includes  automated exposure control, adjustment of the mA and/or kV according to patient size and/or use of iterative reconstruction technique. COMPARISON:  Head CT 04/01/2022 FINDINGS: Brain: No evidence of acute infarction, hemorrhage, hydrocephalus, extra-axial collection or mass lesion/mass effect. Vascular: No hyperdense vessel or unexpected calcification. Skull: Normal. Negative for fracture or focal lesion. Sinuses/Orbits: No acute finding. There has an old left medial orbital wall fracture. Other: None. IMPRESSION: No acute intracranial abnormality. Electronically Signed   By: Ronney Asters M.D.   On: 08/29/2022 23:05   DG Chest Port 1 View  Result Date: 08/29/2022 CLINICAL DATA:  Overdose. EXAM: PORTABLE CHEST 1 VIEW COMPARISON:  Chest radiograph dated 04/01/2022. FINDINGS: No focal consolidation, pleural effusion, pneumothorax. The cardiac silhouette is within limits. No acute osseous pathology. Degenerative changes of the spine and shoulders. IMPRESSION: No active disease. Electronically Signed   By: Anner Crete M.D.   On: 08/29/2022 23:00        Scheduled Meds:  [START ON 08/31/2022]  stroke: early stages of recovery book   Does not apply Once   apixaban  5 mg Oral BID   folic acid  1 mg Oral Daily   insulin aspart  0-15 Units Subcutaneous TID WC   insulin aspart  0-5 Units Subcutaneous QHS   [START ON 09/08/2022] thiamine  100 mg Oral Daily   Continuous Infusions:  [START ON 08/31/2022] cefTRIAXone (ROCEPHIN)  IV     lactated ringers 100 mL/hr at 08/30/22 [id number]   thiamine (VITAMIN B1) injection     Followed by   Derrill Memo ON 09/03/2022] thiamine (VITAMIN B1) injection       LOS: 0 days    Time spent: over 30 min    Fayrene Helper, MD [facility]   To contact the attending provider between 7A-7P or the covering provider during after hours 7P-7A, please log into the web site [email or web address] and access using universal [place] password for that web site. If you do not have  the password, please call the hospital operator.  08/30/2022, 8:45 AM

## 2022-08-30 NOTE — Progress Notes (Signed)
Pt is currently at CT.Try back as schedule permits

## 2022-08-30 NOTE — Evaluation (Signed)
Physical Therapy Evaluation Patient Details Name: Taylor Benitez MRN: [id number] DOB: 1961/01/20 Today's Date: 08/30/2022  History of Present Illness  62 yo female with onset of medication overdose was admitted 3/8 for management of medical needs.  Has UTI, confusion, difficulty with giving history, cocaine use, narcotic use.  PMHx:  cocaine use, DM, HTN, a-fib, hypothyroidism, narcotic use, B THA's, panic attacks, SBO, OA, L great toe osteomyelitis, dentures, diverticulosis, PN  Clinical Impression  Pt was seen for progression to sit up and then stand, notably having difficulty with coordinating movement and managing the IV pole with close turns and limited clearance in the ED room.  Pt is too weak to chance a hallway walk but is motivated to try to get around.  Follow along with pt for progressing mobility as POC outlines, and recommending SNF as she is too weak and unsafe home alone to safely succeed.  Work on strength, balance and control of gait on RW to progress to more independence.       Recommendations for follow up therapy are one component of a multi-disciplinary discharge planning process, led by the attending physician.  Recommendations may be updated based on patient status, additional functional criteria and insurance authorization.  Follow Up Recommendations Skilled nursing-short term rehab (<3 hours/day) Can patient physically be transported by private vehicle: No    Assistance Recommended at Discharge Frequent or constant Supervision/Assistance  Patient can return home with the following  A little help with walking and/or transfers;A little help with bathing/dressing/bathroom;Assistance with cooking/housework;Assist for transportation;Help with stairs or ramp for entrance    Equipment Recommendations None recommended by PT  Recommendations for Other Services       Functional Status Assessment Patient has had a recent decline in their functional status and demonstrates the  ability to make significant improvements in function in a reasonable and predictable amount of time.     Precautions / Restrictions Precautions Precautions: Fall Precaution Comments: motor planning confusion Restrictions Weight Bearing Restrictions: No      Mobility  Bed Mobility Overal bed mobility: Needs Assistance Bed Mobility: Supine to Sit, Sit to Supine     Supine to sit: Mod assist Sit to supine: Mod assist   General bed mobility comments: mod assist and dense cues to remain sitting    Transfers Overall transfer level: Needs assistance Equipment used: Rolling walker (2 wheels) Transfers: Sit to/from Stand             General transfer comment: min assist to stand and min mod to return to sitting on higher gurney surface    Ambulation/Gait Ambulation/Gait assistance: Min assist Gait Distance (Feet): 20 Feet Assistive device: IV Pole Gait Pattern/deviations: Step-through pattern, Decreased stride length, Wide base of support Gait velocity: reduced Gait velocity interpretation: <1.31 ft/sec, indicative of household ambulator Pre-gait activities: standing balance ck General Gait Details: shuffled slow progression with IV and is fairly dependent on it  Stairs            Wheelchair Mobility    Modified Rankin (Stroke Patients Only)       Balance Overall balance assessment: Needs assistance Sitting-balance support: Feet supported Sitting balance-Leahy Scale: Fair     Standing balance support: Bilateral upper extremity supported, During functional activity Standing balance-Leahy Scale: Poor Standing balance comment: requires support but with standing can control balance a bit  Pertinent Vitals/Pain      Home Living Family/patient expects to be discharged to:: Private residence Living Arrangements: Alone Available Help at Discharge: Friend(s);Available PRN/intermittently Type of Home: House            Home Equipment: Other (comment) (pt unsure) Additional Comments: majority of information from home is not recalled by pt    Prior Function Prior Level of Function : Independent/Modified Independent             Mobility Comments: reports she did not drive but not sure if she used AD       Hand Dominance   Dominant Hand: Right    Extremity/Trunk Assessment   Upper Extremity Assessment Upper Extremity Assessment: Generalized weakness    Lower Extremity Assessment Lower Extremity Assessment: Generalized weakness    Cervical / Trunk Assessment Cervical / Trunk Assessment: Kyphotic  Communication   Communication: No difficulties  Cognition Arousal/Alertness: Awake/alert Behavior During Therapy: Impulsive Overall Cognitive Status: No family/caregiver present to determine baseline cognitive functioning                                 General Comments: pt is not able to consistently follow directions and unsure if she is baseline        General Comments General comments (skin integrity, edema, etc.): pt is up to walk wiht help but is weak, cannot do this alone and is unaware of safety to release the IV pole and scoot back on bed without mod assist to sequence her    Exercises     Assessment/Plan    PT Assessment Patient needs continued PT services  PT Problem List Decreased strength;Decreased activity tolerance;Decreased balance;Decreased mobility;Decreased coordination;Decreased knowledge of use of DME;Decreased safety awareness       PT Treatment Interventions DME instruction;Gait training;Functional mobility training;Therapeutic activities;Therapeutic exercise;Balance training;Neuromuscular re-education;Patient/family education    PT Goals (Current goals can be found in the Care Plan section)  Acute Rehab PT Goals Patient Stated Goal: to get home when ready PT Goal Formulation: With patient Time For Goal Achievement: 09/13/22 Potential to Achieve  Goals: Good    Frequency Min 2X/week     Co-evaluation               AM-PAC PT "6 Clicks" Mobility  Outcome Measure Help needed turning from your back to your side while in a flat bed without using bedrails?: A Lot Help needed moving from lying on your back to sitting on the side of a flat bed without using bedrails?: A Lot Help needed moving to and from a bed to a chair (including a wheelchair)?: A Lot Help needed standing up from a chair using your arms (e.g., wheelchair or bedside chair)?: A Little Help needed to walk in hospital room?: A Lot Help needed climbing 3-5 steps with a railing? : Total 6 Click Score: 12    End of Session Equipment Utilized During Treatment: Gait belt Activity Tolerance: Patient limited by fatigue;Treatment limited secondary to medical complications (Comment) Patient left: in bed;with call bell/phone within reach Nurse Communication: Mobility status PT Visit Diagnosis: Unsteadiness on feet (R26.81);Muscle weakness (generalized) (M62.81);Difficulty in walking, not elsewhere classified (R26.2)    Time: [id number] PT Time Calculation (min) (ACUTE ONLY): 18 min   Charges:   PT Evaluation $PT Eval Moderate Complexity: 1 Mod         Ramond Dial 08/30/2022, 2:47 PM  Mee Hives, PT PhD Acute  Rehab Dept. Number: [place] [facility] [place]

## 2022-08-30 NOTE — ED Notes (Signed)
ED TO INPATIENT HANDOFF REPORT  ED Nurse Name and Phone #: Cat [phone]  S Name/Age/Gender Taylor Benitez 62 y.o. female Room/Bed: 008C/008C  Code Status   Code Status: Full Code  Home/SNF/Other Home Patient oriented to: self Is this baseline?  Unknown  Triage Complete: Triage complete  Chief Complaint Acute metabolic encephalopathy [id number]  Triage Note Pt BIB by [facility] d/t possible OD on Baclofen.  46 pills were missing,  Roommate's son called - last seen normal 27 hours ago.  Pt had pills and ants around her.     Allergies Allergies  Allergen Reactions   Glipizide     GI side eff   Metformin And Related     GI side effects    Penicillins Rash    Tolerated Cephalosporin Date: 11/04/19.     Level of Care/Admitting Diagnosis ED Disposition     ED Disposition  Admit   Condition  --   [place]: [facility] [[id number]]  Level of Care: Telemetry Cardiac [[id number]]  May admit patient to [facility] or [facility] if equivalent level of care is available:: Yes  Covid Evaluation: Confirmed COVID Negative  Diagnosis: Acute metabolic encephalopathy [id number]  Admitting Physician: Quintella Baton [[id number]]  Attending Physician: Quintella Baton [id number]  Certification:: I certify this patient will need inpatient services for at least 2 midnights  Estimated Length of Stay: 2          B Medical/Surgery History Past Medical History:  Diagnosis Date   Chronic narcotic use    Chronic pain    Cirrhosis of liver ([facility]) 2010   followed by pcp  (07-30-2021  pt stated was told this many yrs ago but has no symptoms,  normal enzymes 07-02-2021 lab in epic)   Cocaine abuse ([place])    (07-30-2021  per pt average cocaine monthly, stated had not used since positive UDS 07-02-2021)   Full dentures    History of diverticulitis of colon 2006   s/p  hemicolectomy with colostomy for perferated sigmoid colon , takedown in 200[address]   History of drug overdose 2017    accidental narcotic   History of gastric ulcer 2010   History of panic attacks    History of small bowel obstruction 11/19/2016   s/p  exploratory laparotomy   Hypertension    Hypothyroidism    followed by pcp   Marijuana abuse    (07-30-2021  pt stated smokes weekly)   OA (osteoarthritis)    Osteomyelitis of great toe of left foot ([place])    Peripheral neuropathy    hands and feet   Seronegative rheumatoid arthritis ([place])    Type 2 diabetes mellitus ([place])    followed by pcp   (07-30-2021  pt stated does not check blood sugar at home)   Past Surgical History:  Procedure Laterality Date   AMPUTATION TOE Left 08/08/2021   Procedure: AMPUTATION LEFT FIRST TOE;  Surgeon: Jobe Igo, DPM;  Location: [facility];  Service: Podiatry;  Laterality: Left;  WITH ANKLE BLOCK   BREAST REDUCTION SURGERY Bilateral 1989   COLON SURGERY  2006   hemicolecotmy w/ colostomy;   takedown done in 2007   ('@[facility]   ESOPHAGOGASTRODUODENOSCOPY  01/30/2009   by dr Ardis Hughs   EXPLORATORY LAPAROTOMY  11/19/2016   [facility];  exploratory celiotomy  for SBO   TONSILLECTOMY     child   TOTAL HIP ARTHROPLASTY Right 2019   TOTAL HIP ARTHROPLASTY Left 11/02/2019  Procedure: TOTAL HIP ARTHROPLASTY ANTERIOR APPROACH;  Surgeon: Gaynelle Arabian, MD;  Location: [facility];  Service: Orthopedics;  Laterality: Left;  145mn   TOTAL KNEE ARTHROPLASTY Bilateral    left 11-06-2016;  right   TOTAL VAGINAL HYSTERECTOMY Bilateral 2005   per pt w/ bilateral salpingoophorectomy     A IV Location/Drains/Wounds Patient Lines/Drains/Airways Status     Active Line/Drains/Airways     Name Placement date Placement time Site Days   Peripheral IV 08/29/22 18 G 1" Anterior;Right Forearm 08/29/22  2200  Forearm  1            Intake/Output Last 24 hours  Intake/Output Summary (Last 24 hours) at 08/30/2022 1240 Last data filed at 08/30/2022 0801 Gross per 24 hour  Intake 3133.42 ml  Output --  Net  3133.42 ml    Labs/Imaging Results for orders placed or performed during the hospital encounter of 08/29/22 (from the past 48 hour(s))  Comprehensive metabolic panel     Status: Abnormal   Collection Time: 08/29/22 11:10 PM  Result Value Ref Range   Sodium 140 135 - 145 mmol/L   Potassium 4.1 3.5 - 5.1 mmol/L   Chloride 105 98 - 111 mmol/L   CO2 24 22 - 32 mmol/L   Glucose, Bld 169 (H) 70 - 99 mg/dL    Comment: Glucose reference range applies only to samples taken after fasting for at least 8 hours.   BUN 15 8 - 23 mg/dL   Creatinine, Ser 1.11 (H) 0.44 - 1.00 mg/dL   Calcium 9.5 8.9 - 10.3 mg/dL   Total Protein 7.1 6.5 - 8.1 g/dL   Albumin 3.8 3.5 - 5.0 g/dL   AST 21 15 - 41 U/L   ALT 21 0 - 44 U/L   Alkaline Phosphatase 75 38 - 126 U/L   Total Bilirubin 0.9 0.3 - 1.2 mg/dL   GFR, Estimated 56 (L) >60 mL/min    Comment: (NOTE) Calculated using the CKD-EPI Creatinine Equation (2021)    Anion gap 11 5 - 15    Comment: Performed at [id number] [place] [id number] Salicylate level     Status: Abnormal   Collection Time: 08/29/22 11:10 PM  Result Value Ref Range   Salicylate Lvl <Q000111Q(L) 7.0 - 30.0 mg/dL    Comment: Performed at [facility] [id number] Acetaminophen level     Status: Abnormal   Collection Time: 08/29/22 11:10 PM  Result Value Ref Range   Acetaminophen (Tylenol), Serum <10 (L) 10 - 30 ug/mL    Comment: (NOTE) Therapeutic concentrations vary significantly. A range of 10-30 ug/mL  may be an effective concentration for many patients. However, some  are best treated at concentrations outside of this range. Acetaminophen concentrations >150 ug/mL at 4 hours after ingestion  and >50 ug/mL at 12 hours after ingestion are often associated with  toxic reactions.  Performed at [facility] [address] [place] [address]  Ethanol     Status: None   Collection Time: 08/29/22  11:10 PM  Result Value Ref Range   Alcohol, Ethyl (B) <10 <10 mg/dL    Comment: (NOTE) Lowest detectable limit for serum alcohol is 10 mg/dL.  For medical purposes only. Performed at [facility] [address] [name]  [id number]  CBC WITH DIFFERENTIAL     Status: None   Collection Time: 08/29/22 11:10  PM  Result Value Ref Range   WBC 7.0 4.0 - 10.5 K/uL   RBC 4.80 3.87 - 5.11 MIL/uL   Hemoglobin 13.1 12.0 - 15.0 g/dL   HCT 40.4 36.0 - 46.0 %   MCV 84.2 80.0 - 100.0 fL   MCH 27.3 26.0 - 34.0 pg   MCHC 32.4 30.0 - 36.0 g/dL   RDW 15.3 11.5 - 15.5 %   Platelets 205 150 - 400 K/uL   nRBC 0.0 0.0 - 0.2 %   Neutrophils Relative % 75 %   Neutro Abs 5.3 1.7 - 7.7 K/uL   Lymphocytes Relative 20 %   Lymphs Abs 1.4 0.7 - 4.0 K/uL   Monocytes Relative 4 %   Monocytes Absolute 0.3 0.1 - 1.0 K/uL   Eosinophils Relative 1 %   Eosinophils Absolute 0.0 0.0 - 0.5 K/uL   Basophils Relative 0 %   Basophils Absolute 0.0 0.0 - 0.1 K/uL   Immature Granulocytes 0 %   Abs Immature Granulocytes 0.02 0.00 - 0.07 K/uL    Comment: Performed at [facility], [address] [place], [place] [address]  CK     Status: Abnormal   Collection Time: 08/29/22 11:10 PM  Result Value Ref Range   Total CK 21 (L) 38 - 234 U/L    Comment: Performed at [facility], [place] [address], [place], [place] [address]  TSH     Status: Abnormal   Collection Time: 08/29/22 11:10 PM  Result Value Ref Range   TSH 0.034 (L) 0.350 - 4.500 uIU/mL    Comment: Performed by a 3rd Generation assay with a functional sensitivity of <=0.01 uIU/mL. Performed at [facility], [place] [address], [place], [place] [address]   Magnesium     Status: None   Collection Time: 08/29/22 11:10 PM  Result Value Ref Range   Magnesium 1.7 1.7 - 2.4 mg/dL    Comment: Performed at [facility] [address], [place], [place] [address]  T4, free     Status: Abnormal   Collection Time: 08/29/22 11:10 PM   Result Value Ref Range   Free T4 1.22 (H) 0.61 - 1.12 ng/dL    Comment: (NOTE) Biotin ingestion may interfere with free T4 tests. If the results are inconsistent with the TSH level, previous test results, or the clinical presentation, then consider biotin interference. If needed, order repeat testing after stopping biotin. Performed at [facility], [place] [address] [place], [place] [address]   CBG monitoring, ED     Status: Abnormal   Collection Time: 08/29/22 11:23 PM  Result Value Ref Range   Glucose-Capillary 165 (H) 70 - 99 mg/dL    Comment: Glucose reference range applies only to samples taken after fasting for at least 8 hours.  Ammonia     Status: None   Collection Time: 08/30/22  3:43 AM  Result Value Ref Range   Ammonia <10 9 - 35 umol/L    Comment: Performed at [facility], [place] [address] [place], [place] [address]  Urine rapid drug screen (hosp performed)     Status: Abnormal   Collection Time: 08/30/22  5:30 AM  Result Value Ref Range   Opiates NONE DETECTED NONE DETECTED   Cocaine POSITIVE (A) NONE DETECTED   Benzodiazepines NONE DETECTED NONE DETECTED   Amphetamines NONE DETECTED NONE DETECTED   Tetrahydrocannabinol NONE DETECTED NONE DETECTED   Barbiturates NONE DETECTED NONE DETECTED  Comment: (NOTE) DRUG SCREEN FOR MEDICAL PURPOSES ONLY.  IF CONFIRMATION IS NEEDED FOR ANY PURPOSE, NOTIFY LAB WITHIN 5 DAYS.  LOWEST DETECTABLE LIMITS FOR URINE DRUG SCREEN Drug Class                     Cutoff (ng/mL) Amphetamine and metabolites    1000 Barbiturate and metabolites    200 Benzodiazepine                 200 Opiates and metabolites        300 Cocaine and metabolites        300 THC                            50 Performed at [facility], [place] [address], [place], [place] [address]   Urinalysis, Routine w reflex microscopic -Urine, Clean Catch     Status: Abnormal   Collection Time: 08/30/22  5:30 AM  Result Value Ref Range   Color,  Urine YELLOW YELLOW   APPearance HAZY (A) CLEAR   Specific Gravity, Urine 1.011 1.005 - 1.030   pH 5.0 5.0 - 8.0   Glucose, UA 50 (A) NEGATIVE mg/dL   Hgb urine dipstick NEGATIVE NEGATIVE   Bilirubin Urine NEGATIVE NEGATIVE   Ketones, ur NEGATIVE NEGATIVE mg/dL   Protein, ur NEGATIVE NEGATIVE mg/dL   Nitrite NEGATIVE NEGATIVE   Leukocytes,Ua LARGE (A) NEGATIVE   RBC / HPF 0-5 0 - 5 RBC/hpf   WBC, UA >50 0 - 5 WBC/hpf   Bacteria, UA RARE (A) NONE SEEN   Squamous Epithelial / HPF 0-5 0 - 5 /HPF   WBC Clumps PRESENT    Non Squamous Epithelial 0-5 (A) NONE SEEN    Comment: Performed at [facility], [place] [address] [place], [place] [address]  Blood culture (routine x 2)     Status: None (Preliminary result)   Collection Time: 08/30/22  6:05 AM   Specimen: BLOOD RIGHT HAND  Result Value Ref Range   Specimen Description BLOOD RIGHT HAND    Special Requests      BOTTLES DRAWN AEROBIC AND ANAEROBIC Blood Culture results may not be optimal due to an inadequate volume of blood received in culture bottles   Culture      NO GROWTH < 12 HOURS Performed at [facility] [address] [place], [place] [address]    Report Status PENDING   Blood culture (routine x 2)     Status: None (Preliminary result)   Collection Time: 08/30/22  6:16 AM   Specimen: BLOOD LEFT FOREARM  Result Value Ref Range   Specimen Description BLOOD LEFT FOREARM    Special Requests      BOTTLES DRAWN AEROBIC AND ANAEROBIC Blood Culture adequate volume   Culture      NO GROWTH < 12 HOURS Performed at [facility], [place] [address], [place], [place] [address]    Report Status PENDING   HIV Antibody (routine testing w rflx)     Status: None   Collection Time: 08/30/22  6:53 AM  Result Value Ref Range   HIV Screen 4th Generation wRfx Non Reactive Non Reactive    Comment: Performed at [facility], [place] [address], [place],  [address]  Vitamin B12     Status: None   Collection Time:  08/30/22  6:53 AM  Result Value Ref Range  Vitamin B-12 365 180 - 914 pg/mL    Comment: (NOTE) This assay is not validated for testing neonatal or myeloproliferative syndrome specimens for Vitamin B12 levels. Performed at [facility], [place] [address], [place], [place] [address]   Folate     Status: None   Collection Time: 08/30/22  7:00 AM  Result Value Ref Range   Folate 25.0 >5.9 ng/mL    Comment: Performed at [facility] [address], [place], [place] [address]  Lipid panel     Status: Abnormal   Collection Time: 08/30/22  7:18 AM  Result Value Ref Range   Cholesterol 139 0 - 200 mg/dL   Triglycerides 213 (H) <150 mg/dL   HDL 26 (L) >40 mg/dL   Total CHOL/HDL Ratio 5.3 RATIO   VLDL 43 (H) 0 - 40 mg/dL   LDL Cholesterol 70 0 - 99 mg/dL    Comment:        Total Cholesterol/HDL:CHD Risk Coronary Heart Disease Risk Table                     Men   Women  1/2 Average Risk   3.4   3.3  Average Risk       5.0   4.4  2 X Average Risk   9.6   7.1  3 X Average Risk  23.4   11.0        Use the calculated Patient Ratio above and the CHD Risk Table to determine the patient's CHD Risk.        ATP III CLASSIFICATION (LDL):  <100     mg/dL   Optimal  100-129  mg/dL   Near or Above                    Optimal  130-159  mg/dL   Borderline  160-189  mg/dL   High  >190     mg/dL   Very High Performed at [facility] [address], [place], [place] [address]   CBG monitoring, ED     Status: Abnormal   Collection Time: 08/30/22  8:02 AM  Result Value Ref Range   Glucose-Capillary 140 (H) 70 - 99 mg/dL    Comment: Glucose reference range applies only to samples taken after fasting for at least 8 hours.  Lactic acid, plasma     Status: None   Collection Time: 08/30/22  8:10 AM  Result Value Ref Range   Lactic Acid, Venous 0.9 0.5 - 1.9 mmol/L    Comment: Performed at [facility] [address], [place], [place] [address]  CBG monitoring, ED     Status:  Abnormal   Collection Time: 08/30/22 10:10 AM  Result Value Ref Range   Glucose-Capillary 113 (H) 70 - 99 mg/dL    Comment: Glucose reference range applies only to samples taken after fasting for at least 8 hours.  CBG monitoring, ED     Status: Abnormal   Collection Time: 08/30/22 11:20 AM  Result Value Ref Range   Glucose-Capillary 134 (H) 70 - 99 mg/dL    Comment: Glucose reference range applies only to samples taken after fasting for at least 8 hours.   CT ANGIO HEAD NECK W WO CM  Result Date: 08/30/2022 CLINICAL DATA:  62 year old female presenting with neurologic deficit yesterday. Negative brain MRI this morning. EXAM: CT ANGIOGRAPHY HEAD AND NECK TECHNIQUE: Multidetector CT imaging of the head and neck  was performed using the standard protocol during bolus administration of intravenous contrast. Multiplanar CT image reconstructions and MIPs were obtained to evaluate the vascular anatomy. Carotid stenosis measurements (when applicable) are obtained utilizing NASCET criteria, using the distal internal carotid diameter as the denominator. RADIATION DOSE REDUCTION: This exam was performed according to the departmental dose-optimization program which includes automated exposure control, adjustment of the mA and/or kV according to patient size and/or use of iterative reconstruction technique. CONTRAST:  25m OMNIPAQUE IOHEXOL 350 MG/ML SOLN COMPARISON:  Brain MRI 0158 hours today.  Head CT yesterday. FINDINGS: CT HEAD Brain: Stable non contrast CT appearance of the brain. Calvarium and skull base: Chronic left orbital floor fracture. Stable visualized osseous structures. Paranasal sinuses: Chronic left orbital floor fracture. Visualized paranasal sinuses and mastoids are stable and well aerated. Orbits: Stable orbit and scalp soft tissues. CTA NECK Skeleton: Multilevel cervical spine ankylosis with superimposed advanced C3-C4 and C4-C5 degeneration. C5-C6 and C6-C7 ankylosis might be postoperative in  nature. There is also bilateral posterior element ankylosis through the cervicothoracic junction and involving most but not all of the visible upper thoracic levels. No acute osseous abnormality identified. Upper chest: Negative. Other neck: Negative. Aortic arch: Incompletely visible 3 vessel arch with arch atherosclerosis. Right carotid system: Partially visible brachiocephalic artery plaque without stenosis. Minimal plaque at the right CCA origin. Soft and calcified plaque along the anterior vessel before the carotid bifurcation. No stenosis before the bifurcation. Additional bulky calcified plaque at the right ICA origin and bulb. Up to 60 % stenosis with respect to the distal vessel occurs at the distal bulb (series 12, image 187). Left carotid system: Plaque at the visible left CCA origin without stenosis. Mild additional left CCA plaque before the bifurcation. Bulky calcified plaque at the left ICA origin and bulb with up to 75 % stenosis with respect to the distal vessel at the distal bulb on series 12, image 187. The vessel remains patent to the skull base. Vertebral arteries: Mild proximal right subclavian artery plaque without stenosis. Patent right vertebral artery origin but with calcified plaque. No significant stenosis (series 14, image 61). Right vertebral artery than normal to the skull base. Mild to moderate proximal left subclavian artery soft and calcified plaque without stenosis. Normal left vertebral artery origin. Fairly codominant left vertebral artery is patent to the skull base with only minor plaque. CTA HEAD Posterior circulation: Patent distal vertebral arteries and vertebrobasilar junction with no significant plaque or stenosis. Patent PICA origins. Patent basilar artery without stenosis. Patent SCA and PCA origins. Posterior communicating arteries are diminutive or absent. Bilateral PCA branches are within normal limits. Anterior circulation: Both ICA siphons are patent. Moderate left  siphon cavernous segment calcified plaque with only mild stenosis. Similar right cavernous calcified plaque with mild to moderate stenosis (series 13, image 72). Patent carotid termini, MCA and ACA origins. Normal anterior communicating artery. Bilateral ACA branches are within normal limits. Both MCA M1 segments and MCA bi/trifurcations are patent without stenosis. Bilateral MCA branches are within normal limits. Venous sinuses: Patent. Anatomic variants: None significant. Review of the MIP images confirms the above findings IMPRESSION: 1. Negative for large vessel occlusion. 2. Positive for bulky calcified plaque at both ICA origins and bulbs. - up to 75% stenosis of the Left ICA distal bulb. - 60% stenosis of the Right ICA distal bulb. - up to Moderate stenosis of the cavernous Right ICA siphon. 3. No significant posterior circulation stenosis. 4. Stable CT appearance of the brain. 5. 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 6.  Aortic Atherosclerosis (ICD10-I70.0). Electronically Signed   By: Genevie Ann M.D.   On: 08/30/2022 10:13   MR BRAIN WO CONTRAST  Result Date: 08/30/2022 CLINICAL DATA:  Stroke suspected EXAM: MRI HEAD WITHOUT CONTRAST TECHNIQUE: Multiplanar, multiecho pulse sequences of the brain and surrounding structures were obtained without intravenous contrast. COMPARISON:  No prior MRI available, correlation is made with CT head 08/29/2022 FINDINGS: Brain: No restricted diffusion to suggest acute or subacute infarct. No acute hemorrhage, mass, mass effect, or midline shift. No hydrocephalus or extra-axial collection. Normal craniocervical junction. Focus of susceptibility in the left frontal lobe (series 12, image 37), which may represent the sequela of a prior microhemorrhage. Scattered T2 hyperintense signal in the periventricular white matter, likely the sequela of mild chronic small vessel ischemic disease. Vascular:  Normal arterial flow voids. Skull and upper cervical spine: Normal marrow signal. Sinuses/Orbits: Small mucous retention cyst in the anterior left ethmoid air cells. Otherwise clear paranasal sinuses. No acute finding in the orbits. Other: Trace fluid in right mastoid air cells. IMPRESSION: No acute intracranial process. No evidence of acute or subacute infarct. Electronically Signed   By: Merilyn Baba M.D.   On: 08/30/2022 02:17   DG Abdomen 1 View  Result Date: 08/30/2022 CLINICAL DATA:  Screening for MRI.  Possible overdose on baclofen. EXAM: ABDOMEN - 1 VIEW COMPARISON:  None Available. FINDINGS: The bowel gas pattern is normal. No radio-opaque calculi or other significant radiographic abnormality are seen. Overlying telemetry leads. No radiopaque foreign body. Bilateral THAs. IMPRESSION: 1. No radiopaque foreign body which would be a contraindication to MRI. Electronically Signed   By: Placido Sou M.D.   On: 08/30/2022 01:14   CT HEAD WO CONTRAST  Result Date: 08/29/2022 CLINICAL DATA:  Neuro deficit. EXAM: CT HEAD WITHOUT CONTRAST TECHNIQUE: Contiguous axial images were obtained from the base of the skull through the vertex without intravenous contrast. RADIATION DOSE REDUCTION: This exam was performed according to the departmental dose-optimization program which includes automated exposure control, adjustment of the mA and/or kV according to patient size and/or use of iterative reconstruction technique. COMPARISON:  Head CT 04/01/2022 FINDINGS: Brain: No evidence of acute infarction, hemorrhage, hydrocephalus, extra-axial collection or mass lesion/mass effect. Vascular: No hyperdense vessel or unexpected calcification. Skull: Normal. Negative for fracture or focal lesion. Sinuses/Orbits: No acute finding. There has an old left medial orbital wall fracture. Other: None. IMPRESSION: No acute intracranial abnormality. Electronically Signed   By: Ronney Asters M.D.   On: 08/29/2022 23:05   DG Chest  Port 1 View  Result Date: 08/29/2022 CLINICAL DATA:  Overdose. EXAM: PORTABLE CHEST 1 VIEW COMPARISON:  Chest radiograph dated 04/01/2022. FINDINGS: No focal consolidation, pleural effusion, pneumothorax. The cardiac silhouette is within limits. No acute osseous pathology. Degenerative changes of the spine and shoulders. IMPRESSION: No active disease. Electronically Signed   By: Anner Crete M.D.   On: 08/29/2022 23:00    Pending Labs Unresulted Labs (From admission, onward)     Start     Ordered   08/31/22 [id number]  Basic metabolic panel  Tomorrow morning,   R        08/30/22 0626   08/31/22 0500  CBC with Differential/Platelet  Tomorrow morning,   R        08/30/22 0626   08/31/22 0500  Lipid panel  (Labs)  Tomorrow morning,   R  Comments: Fasting    08/30/22 0715   08/30/22 0810  Vitamin B1  Add-on,   AD        08/30/22 0809   08/30/22 [id number]  Lactic acid, plasma  STAT Now then every 3 hours,   R (with STAT occurrences)      08/30/22 0701   08/30/22 0627  RPR  Once,   R        08/30/22 0626   08/30/22 0627  Hemoglobin A1c  Once,   R       Comments: To assess prior glycemic control    08/30/22 0627   08/30/22 0556  Urine Culture (for pregnant, neutropenic or urologic patients or patients with an indwelling urinary catheter)  (Urine Labs)  Add-on,   AD       Question:  Indication  Answer:  Dysuria   08/30/22 0555            Vitals/Pain Today's Vitals   08/30/22 0900 08/30/22 1011 08/30/22 1045 08/30/22 1232  BP:  (!) 162/76 (!) 189/85   Pulse: 79 71 60   Resp: '16 13 18   '$ Temp:    98.7 F (37.1 C)  TempSrc:    Oral  SpO2: 92% 98% 98%   Weight:      Height:        Isolation Precautions No active isolations  Medications Medications  ondansetron (ZOFRAN) tablet 4 mg (has no administration in time range)    Or  ondansetron (ZOFRAN) injection 4 mg (has no administration in time range)  insulin aspart (novoLOG) injection 0-15 Units (2 Units Subcutaneous Given  08/30/22 1131)  insulin aspart (novoLOG) injection 0-5 Units (has no administration in time range)  cefTRIAXone (ROCEPHIN) 1 g in sodium chloride 0.9 % 100 mL IVPB (has no administration in time range)  lactated ringers infusion ( Intravenous New Bag/Given [id number])  folic acid (FOLVITE) tablet 1 mg (1 mg Oral Given 08/30/22 0956)   stroke: early stages of recovery book (has no administration in time range)  acetaminophen (TYLENOL) tablet 650 mg (has no administration in time range)    Or  acetaminophen (TYLENOL) 160 MG/5ML solution 650 mg (has no administration in time range)    Or  acetaminophen (TYLENOL) suppository 650 mg (has no administration in time range)  senna-docusate (Senokot-S) tablet 1 tablet (has no administration in time range)  metoprolol tartrate (LOPRESSOR) injection 5 mg (has no administration in time range)  thiamine (VITAMIN B1) 500 mg in sodium chloride 0.9 % 50 mL IVPB (has no administration in time range)    Followed by  thiamine (VITAMIN B1) 250 mg in sodium chloride 0.9 % 50 mL IVPB (has no administration in time range)    Followed by  thiamine (VITAMIN B1) tablet 100 mg (has no administration in time range)  sodium chloride 0.9 % bolus 1,000 mL (0 mLs Intravenous Stopped 08/30/22 0100)  lactated ringers bolus 1,000 mL (0 mLs Intravenous Stopped 08/30/22 0559)  cefTRIAXone (ROCEPHIN) 2 g in sodium chloride 0.9 % 100 mL IVPB (0 g Intravenous Stopped 08/30/22 0652)  iohexol (OMNIPAQUE) 350 MG/ML injection 75 mL (75 mLs Intravenous Contrast Given 08/30/22 0951)    Mobility walks with person assist     Focused Assessments Neuro Assessment Handoff:  Swallow screen pass? Yes  Cardiac Rhythm: Normal sinus rhythm NIH Stroke Scale  Dizziness Present: No Headache Present: No Interval: Shift assessment Level of Consciousness (1a.)   : Alert, keenly responsive LOC Questions (1b. )   :  Answers neither question correctly LOC Commands (1c. )   : Performs both tasks  correctly Best Gaze (2. )  : Normal Visual (3. )  : No visual loss Facial Palsy (4. )    : Normal symmetrical movements Motor Arm, Left (5a. )   : No drift Motor Arm, Right (5b. ) : No drift Motor Leg, Left (6a. )  : No drift Motor Leg, Right (6b. ) : No drift Limb Ataxia (7. ): Absent Sensory (8. )  : Normal, no sensory loss Best Language (9. )  : No aphasia Dysarthria (10. ): Normal Extinction/Inattention (11.)   : No Abnormality Complete NIHSS TOTAL: 2     Neuro Assessment: Exceptions to WDL Neuro Checks:   Initial (08/29/22 2300)  Has TPA been given? No If patient is a Neuro Trauma and patient is going to OR before floor call report to [place] nurse: [phone] or [phone]   R Recommendations: See Admitting Provider Note  Report given to:   Additional Notes:

## 2022-08-30 NOTE — Progress Notes (Signed)
ANTICOAGULATION CONSULT NOTE - Initial Consult  Pharmacy Consult for Eliiquis Indication: atrial fibrillation  Allergies  Allergen Reactions   Glipizide     GI side eff   Metformin And Related     GI side effects    Penicillins Rash    Tolerated Cephalosporin Date: 11/04/19.     Patient Measurements: Height: '5\' 1"'$  (154.9 cm) Weight: 63.5 kg (139 lb 15.9 oz) IBW/kg (Calculated) : 47.8  Vital Signs: Temp: 99.3 F (37.4 C) (03/09 0506) Temp Source: Rectal (03/09 0506) BP: 151/73 (03/09 0615) Pulse Rate: 87 (03/09 0615)  Labs: Recent Labs    08/29/22 2310  HGB 13.1  HCT 40.4  PLT 205  CREATININE 1.11*  CKTOTAL 21*    Estimated Creatinine Clearance: 44.9 mL/min (A) (by C-G formula based on SCr of 1.11 mg/dL (H)).   Medical History: Past Medical History:  Diagnosis Date   Chronic narcotic use    Chronic pain    Cirrhosis of liver ([place]) 2010   followed by pcp  (07-30-2021  pt stated was told this many yrs ago but has no symptoms,  normal enzymes 07-02-2021 lab in epic)   Cocaine abuse ([place])    (07-30-2021  per pt average cocaine monthly, stated had not used since positive UDS 07-02-2021)   Full dentures    History of diverticulitis of colon 2006   s/p  hemicolectomy with colostomy for perferated sigmoid colon , takedown in 20074   History of drug overdose 2017   accidental narcotic   History of gastric ulcer 2010   History of panic attacks    History of small bowel obstruction 11/19/2016   s/p  exploratory laparotomy   Hypertension    Hypothyroidism    followed by pcp   Marijuana abuse    (07-30-2021  pt stated smokes weekly)   OA (osteoarthritis)    Osteomyelitis of great toe of left foot (HCC)    Peripheral neuropathy    hands and feet   Seronegative rheumatoid arthritis ([place])    Type 2 diabetes mellitus ([place])    followed by pcp   (07-30-2021  pt stated does not check blood sugar at home)    Medications:  No current facility-administered  medications on file prior to encounter.   Current Outpatient Medications on File Prior to Encounter  Medication Sig Dispense Refill   amitriptyline (ELAVIL) 100 MG tablet TAKE 2 TABLETS BY MOUTH AT BEDTIME (Patient taking differently: Take 200 mg by mouth.) 180 tablet 0   baclofen (LIORESAL) 10 MG tablet Take 10 mg by mouth 2 (two) times daily as needed for muscle spasms.     celecoxib (CELEBREX) 100 MG capsule Take 100 mg by mouth 2 (two) times daily.     cephALEXin (KEFLEX) 500 MG capsule Take 1 capsule (500 mg total) by mouth 4 (four) times daily. 28 capsule 0   Cholecalciferol (VITAMIN D3) 1.25 MG (50000 UT) TABS Take 1 tablet by mouth every 7 (seven) days. (Patient taking differently: Take 1 tablet by mouth every 7 (seven) days. Monday's) 12 tablet 3   empagliflozin (JARDIANCE) 25 MG TABS tablet Take 25 mg by mouth daily.     hydrOXYzine (ATARAX/VISTARIL) 25 MG tablet TAKE 1/2 TO 1 TABLET BY MOUTH EVERY 8 HOURS AS NEEDED FOR ANXETY (Patient not taking: Reported on 07/30/2021) 30 tablet 1   levothyroxine (SYNTHROID) 150 MCG tablet Take 1 tablet (150 mcg total) by mouth daily. (Patient taking differently: Take 150 mcg by mouth daily.) 30 tablet 5  metFORMIN (GLUMETZA) 1000 MG (MOD) 24 hr tablet Take 1,000 mg by mouth 2 (two) times daily with a meal.     Multiple Vitamins-Minerals (MULTIVITAMIN WOMEN PO) Take by mouth daily.     oxyCODONE (OXY IR/ROXICODONE) 5 MG immediate release tablet Take 5 mg by mouth 3 (three) times daily as needed for severe pain.       Assessment: 62 y.o. female with new onset Afib for Eliquis  Plan:  Eliquis 5 mg BID  Caryl Pina 08/30/2022,7:15 AM

## 2022-08-30 NOTE — H&P (Addendum)
PCP:   Abner Greenspan, MD   Chief Complaint:  Altered mentation  HPI: This is a 62 year old female with past medical history of chronic narcotic use/chronic pain, liver cirrhosis, cocaine abuse, rheumatoid arthritis, hypothyroidism, HTN, diabetes mellitus, panic attacks, and history of overdose.  Today she was to have met up with a friend, when she no-showed, her friend went to the house, where the patient was found down. She was never unresponsive just very confused. show.  No one had heard from her in over 24 hours. In the house there were several empty bottles of baclofen laying on the ground.  On presentation to the ER she had left sided facial droop and weakness in the left arm and leg.  Code stroke was not called as there was no last known normal.  In the ER CT head, MRI head were negative. UDS came positive, UA suggestive of UTI.  Thyroid studies shows hypothyroidism with a mildly elevated T4. CXR, TSH, ammonia level were normal.  Patient remains confused and unable to provide meaningful history. When asked what brings her here, she states she made a mistake but her mind appears to go blank and she is unable to elaborate. This happen after most questions.  When asked if she took too much of a medication, she answers yes.  If asked if it was baclofen, she answers - yes.  If asked that she took too much baclofen or amitriptyline, she says - yes amitriptyline.  If asked if she took too much baclofen, amitriptyline or Synthroid, she says - yes Synthroid.  She answers affirmatively to the final word heard.  Then her mind blanks and she stares.  Her responses to directions are also delayed.  Review of Systems:  Unable to obtain due to acuity of illness  Past Medical History: Past Medical History:  Diagnosis Date   Chronic narcotic use    Chronic pain    Cirrhosis of liver ([place]) 2010   followed by pcp  (07-30-2021  pt stated was told this many yrs ago but has no symptoms,  normal enzymes  07-02-2021 lab in epic)   Cocaine abuse ([place])    (07-30-2021  per pt average cocaine monthly, stated had not used since positive UDS 07-02-2021)   Full dentures    History of diverticulitis of colon 2006   s/p  hemicolectomy with colostomy for perferated sigmoid colon , takedown in [address]   History of drug overdose 2017   accidental narcotic   History of gastric ulcer 2010   History of panic attacks    History of small bowel obstruction 11/19/2016   s/p  exploratory laparotomy   Hypertension    Hypothyroidism    followed by pcp   Marijuana abuse    (07-30-2021  pt stated smokes weekly)   OA (osteoarthritis)    Osteomyelitis of great toe of left foot ([place])    Peripheral neuropathy    hands and feet   Seronegative rheumatoid arthritis ([place])    Type 2 diabetes mellitus ([place])    followed by pcp   (07-30-2021  pt stated does not check blood sugar at home)   Past Surgical History:  Procedure Laterality Date   AMPUTATION TOE Left 08/08/2021   Procedure: AMPUTATION LEFT FIRST TOE;  Surgeon: Jobe Igo, DPM;  Location: [facility];  Service: Podiatry;  Laterality: Left;  WITH ANKLE BLOCK   BREAST REDUCTION SURGERY Bilateral 1989   COLON SURGERY  2006   hemicolecotmy w/ colostomy;  takedown done in 2007   ('@[facility]'$ -[place])   ESOPHAGOGASTRODUODENOSCOPY  01/30/2009   by dr Ardis Hughs   EXPLORATORY LAPAROTOMY  11/19/2016   '@[facility]'$ -[place];  exploratory celiotomy  for SBO   TONSILLECTOMY     child   TOTAL HIP ARTHROPLASTY Right 2019   TOTAL HIP ARTHROPLASTY Left 11/02/2019   Procedure: TOTAL HIP ARTHROPLASTY ANTERIOR APPROACH;  Surgeon: Gaynelle Arabian, MD;  Location: [facility];  Service: Orthopedics;  Laterality: Left;  138mn   TOTAL KNEE ARTHROPLASTY Bilateral    left 11-06-2016;  right   TOTAL VAGINAL HYSTERECTOMY Bilateral 2005   per pt w/ bilateral salpingoophorectomy    Medications: Prior to Admission medications   Medication Sig Start Date End Date Taking?  Authorizing Provider  amitriptyline (ELAVIL) 100 MG tablet TAKE 2 TABLETS BY MOUTH AT BEDTIME Patient taking differently: Take 200 mg by mouth. 06/14/21   Tower, MWynelle [name] MD  baclofen (LIORESAL) 10 MG tablet Take 10 mg by mouth 2 (two) times daily as needed for muscle spasms.    [provider]  celecoxib (CELEBREX) 100 MG capsule Take 100 mg by mouth 2 (two) times daily.    [provider]  cephALEXin (KEFLEX) 500 MG capsule Take 1 capsule (500 mg total) by mouth 4 (four) times daily. 04/01/22   MValarie Merino MD  Cholecalciferol (VITAMIN D3) 1.25 MG (50000 UT) TABS Take 1 tablet by mouth every 7 (seven) days. Patient taking differently: Take 1 tablet by mouth every 7 (seven) days. Monday's 12/19/19   GElby Beck FNP  empagliflozin (JARDIANCE) 25 MG TABS tablet Take 25 mg by mouth daily.    [provider]  hydrOXYzine (ATARAX/VISTARIL) 25 MG tablet TAKE 1/2 TO 1 TABLET BY MOUTH EVERY 8 HOURS AS NEEDED FOR ANXETY Patient not taking: Reported on 07/30/2021 10/18/20   Tower, MWynelle [name] MD  levothyroxine (SYNTHROID) 150 MCG tablet Take 1 tablet (150 mcg total) by mouth daily. Patient taking differently: Take 150 mcg by mouth daily. 08/09/20   Tower, MWynelle [name] MD  metFORMIN (GLUMETZA) 1000 MG (MOD) 24 hr tablet Take 1,000 mg by mouth 2 (two) times daily with a meal.    [provider]  Multiple Vitamins-Minerals (MULTIVITAMIN WOMEN PO) Take by mouth daily.    [provider]  oxyCODONE (OXY IR/ROXICODONE) 5 MG immediate release tablet Take 5 mg by mouth 3 (three) times daily as needed for severe pain.    [provider]    Allergies:   Allergies  Allergen Reactions   Glipizide     GI side eff   Metformin And Related     GI side effects    Penicillins Rash    Tolerated Cephalosporin Date: 11/04/19.     Social History:  reports that she quit smoking about 4 years ago. Her smoking use included cigarettes. She has never used smokeless  tobacco. She reports that she does not currently use alcohol. She reports current drug use. Drugs: Marijuana, Cocaine, and Benzodiazepines.  Family History: Family History  Problem Relation Age of Onset   Arthritis Mother    Depression Mother    Diabetes Mother    Hearing loss Mother    Stroke Mother    Cancer Father    Diabetes Father    Heart attack Father    Heart disease Father    Stroke Father    Depression Sister    Drug abuse Sister    Depression Brother    Arthritis Maternal Grandmother    Cancer Maternal  Grandfather    COPD Maternal Grandfather    Depression Maternal Grandfather    Depression Paternal Grandmother    Hyperlipidemia Paternal Grandmother    Heart disease Paternal Grandmother     Physical Exam: Vitals:   08/30/22 0245 08/30/22 0246 08/30/22 0500 08/30/22 0506  BP: 137/70  (!) 141/81   Pulse: 87  96   Resp: 19  14   Temp:  98.3 F (36.8 C)  99.3 F (37.4 C)  TempSrc:  Oral  Rectal  SpO2: 96%  92%   Weight:      Height:        General:  Alert and oriented times one, well developed and nourished, no acute distress Eyes: PERRLA, pink conjunctiva, no scleral icterus, pupils dilated ENT: Moist oral mucosa, neck supple, no thyromegaly Lungs: clear to ascultation, no wheeze, no crackles, no use of accessory muscles Cardiovascular: regular rate and rhythm, no regurgitation, no gallops, no murmurs. No carotid bruits, no JVD Abdomen: soft, positive BS, non-tender, non-distended, no organomegaly, not an acute abdomen GU: not examined Neuro: CN II - XII grossly intact Musculoskeletal: strength 5/5 all extremities.  There may be an element of LUE neglect, however, unclear.  There is a delay in comprehension and confusion.  Patient moves the extremity spontaneously but not to direction.  However, when asked to lift LUE, she repeatedly lifts her legs Skin: no rash, no subcutaneous crepitation, no decubitus Psych: Confused patient.  Does not follow directions  clearly   Labs on Admission:  Recent Labs    08/29/22 2310  NA 140  K 4.1  CL 105  CO2 24  GLUCOSE 169*  BUN 15  CREATININE 1.11*  CALCIUM 9.5  MG 1.7   Recent Labs    08/29/22 2310  AST 21  ALT 21  ALKPHOS 75  BILITOT 0.9  PROT 7.1  ALBUMIN 3.8     Recent Labs    08/29/22 2310  CKTOTAL 21*    Recent Labs    08/29/22 2310  TSH 0.034*    Radiological Exams on Admission: MR BRAIN WO CONTRAST  Result Date: 08/30/2022 CLINICAL DATA:  Stroke suspected EXAM: MRI HEAD WITHOUT CONTRAST TECHNIQUE: Multiplanar, multiecho pulse sequences of the brain and surrounding structures were obtained without intravenous contrast. COMPARISON:  No prior MRI available, correlation is made with CT head 08/29/2022 FINDINGS: Brain: No restricted diffusion to suggest acute or subacute infarct. No acute hemorrhage, mass, mass effect, or midline shift. No hydrocephalus or extra-axial collection. Normal craniocervical junction. Focus of susceptibility in the left frontal lobe (series 12, image 37), which may represent the sequela of a prior microhemorrhage. Scattered T2 hyperintense signal in the periventricular white matter, likely the sequela of mild chronic small vessel ischemic disease. Vascular: Normal arterial flow voids. Skull and upper cervical spine: Normal marrow signal. Sinuses/Orbits: Small mucous retention cyst in the anterior left ethmoid air cells. Otherwise clear paranasal sinuses. No acute finding in the orbits. Other: Trace fluid in right mastoid air cells. IMPRESSION: No acute intracranial process. No evidence of acute or subacute infarct. Electronically Signed   By: Merilyn Baba M.D.   On: 08/30/2022 02:17   DG Abdomen 1 View  Result Date: 08/30/2022 CLINICAL DATA:  Screening for MRI.  Possible overdose on baclofen. EXAM: ABDOMEN - 1 VIEW COMPARISON:  None Available. FINDINGS: The bowel gas pattern is normal. No radio-opaque calculi or other significant radiographic abnormality  are seen. Overlying telemetry leads. No radiopaque foreign body. Bilateral THAs. IMPRESSION: 1. No radiopaque  foreign body which would be a contraindication to MRI. Electronically Signed   By: Placido Sou M.D.   On: 08/30/2022 01:14   CT HEAD WO CONTRAST  Result Date: 08/29/2022 CLINICAL DATA:  Neuro deficit. EXAM: CT HEAD WITHOUT CONTRAST TECHNIQUE: Contiguous axial images were obtained from the base of the skull through the vertex without intravenous contrast. RADIATION DOSE REDUCTION: This exam was performed according to the departmental dose-optimization program which includes automated exposure control, adjustment of the mA and/or kV according to patient size and/or use of iterative reconstruction technique. COMPARISON:  Head CT 04/01/2022 FINDINGS: Brain: No evidence of acute infarction, hemorrhage, hydrocephalus, extra-axial collection or mass lesion/mass effect. Vascular: No hyperdense vessel or unexpected calcification. Skull: Normal. Negative for fracture or focal lesion. Sinuses/Orbits: No acute finding. There has an old left medial orbital wall fracture. Other: None. IMPRESSION: No acute intracranial abnormality. Electronically Signed   By: Ronney Asters M.D.   On: 08/29/2022 23:05   DG Chest Port 1 View  Result Date: 08/29/2022 CLINICAL DATA:  Overdose. EXAM: PORTABLE CHEST 1 VIEW COMPARISON:  Chest radiograph dated 04/01/2022. FINDINGS: No focal consolidation, pleural effusion, pneumothorax. The cardiac silhouette is within limits. No acute osseous pathology. Degenerative changes of the spine and shoulders. IMPRESSION: No active disease. Electronically Signed   By: Anner Crete M.D.   On: 08/29/2022 23:00    EKG: Atrial fibrillation  Assessment/Plan Present on Admission:  Acute metabolic encephalopathy -Unclear etiology. DDx: Includes polypharmacy, polysubstance abuse, CVA (patient with A-fib), infection/UTI/aspiration pneumonia, overdose, chronic narcotic use (?   Withdrawal) -Workup for stroke unrevealing.  Presentation appears more metabolic in nature. -Treating underlying infection with antibiotics -Cocaine use: COWS scale initiated -Otherwise we will monitor patient. -Ammonia level normal, -Baclofen, amitriptyline, oxycodone, Vistaril on hold   Acute cystitis -IV Rocephin started, await cultures   Cocaine use -COWs scale initiated  Atrial fibrillation -Rate 90.  No prior recording of atrial fibrillation -May be secondary to patient's hyperthyroid state -2D echo ordered -ChadVasc: 2 (DM, HTN).  Patient with elevated blood pressure reading.  Not on blood pressure medications.  Elevated blood pressure could be secondary to current hospitalization, however, Eliquis started.   -EDP did discuss patient with neurologist on-call, his recommendation was if altered mentation persisted, consider EEG -There is a question of some left upper extremity neglect however, there is a delay in patient's comprehension and is well as patient's confusion.  TIA order set initiated   Hypothyroidism -Currently borderline hyperthyroid secondary to medication.  Hold Synthroid   Chronic narcotic use -Oxycodone on hold, careful with withdrawal.  However, patient's UDS is negative for opiates.  More recent UDS done have also been negative for opiates   Type 2 diabetes mellitus with hyperglycemia (HCC) -Metformin on hold.  Check lactic acid level -Sliding scale insulin   Talal Fritchman 08/30/2022, 6:11 AM

## 2022-08-30 NOTE — Progress Notes (Signed)
EEG complete - results pending 

## 2022-08-30 NOTE — Progress Notes (Signed)
Pt came via [facility] no valuables

## 2022-08-30 NOTE — Procedures (Signed)
Patient Name: CATHIE LANSANG  MRN: [id number]  Epilepsy Attending: Lora Havens  Referring Physician/Provider: Elodia Florence., MD  Date: 08/29/2025 Duration: 28.40 mins  Patient history: 62yo F with ams. EEG to evaluate for seizure  Level of alertness: Awake  AEDs during EEG study: None  Technical aspects: This EEG study was done with scalp electrodes positioned according to the 10-20 International system of electrode placement. Electrical activity was reviewed with band pass filter of 1-'70Hz'$ , sensitivity of 7 uV/mm, display speed of 63m/sec with a '60Hz'$  notched filter applied as appropriate. EEG data were recorded continuously and digitally stored.  Video monitoring was available and reviewed as appropriate.  Description:  posterior dominant rhythm consists of 6 Hz activity of moderate voltage (25-35 uV) seen predominantly in posterior head regions, symmetric and reactive to eye opening and eye closing. EEG showed continuous generalized 5 to 7 Hz theta slowing. Physiologic photic driving was not seen during photic stimulation. Hyperventilation was not performed.     ABNORMALITY - Continuous slow, generalized  IMPRESSION: This study is suggestive of moderate diffuse encephalopathy, nonspecific etiology. No seizures or epileptiform discharges were seen throughout the recording.  Zahniya Zellars OBarbra Sarks

## 2022-08-31 DIAGNOSIS — G9341 Metabolic encephalopathy: Secondary | ICD-10-CM | POA: Diagnosis not present

## 2022-08-31 LAB — MAGNESIUM: Magnesium: 1.3 mg/dL — ABNORMAL LOW (ref 1.7–2.4)

## 2022-08-31 LAB — COMPREHENSIVE METABOLIC PANEL
ALT: 15 U/L (ref 0–44)
AST: 20 U/L (ref 15–41)
Albumin: 2.9 g/dL — ABNORMAL LOW (ref 3.5–5.0)
Alkaline Phosphatase: 61 U/L (ref 38–126)
Anion gap: 11 (ref 5–15)
BUN: 15 mg/dL (ref 8–23)
CO2: 24 mmol/L (ref 22–32)
Calcium: 9.1 mg/dL (ref 8.9–10.3)
Chloride: 102 mmol/L (ref 98–111)
Creatinine, Ser: 1.03 mg/dL — ABNORMAL HIGH (ref 0.44–1.00)
GFR, Estimated: >60 mL/min (ref >60)
Glucose, Bld: 185 mg/dL — ABNORMAL HIGH (ref 70–99)
Potassium: 3.4 mmol/L — ABNORMAL LOW (ref 3.5–5.1)
Sodium: 137 mmol/L (ref 135–145)
Total Bilirubin: 0.4 mg/dL (ref 0.3–1.2)
Total Protein: 5.8 g/dL — ABNORMAL LOW (ref 6.5–8.1)

## 2022-08-31 LAB — CBC WITH DIFFERENTIAL/PLATELET
Abs Immature Granulocytes: 0.01 10*3/uL (ref 0.00–0.07)
Basophils Absolute: 0 10*3/uL (ref 0.0–0.1)
Basophils Relative: 1 %
Eosinophils Absolute: 0.1 10*3/uL (ref 0.0–0.5)
Eosinophils Relative: 1 %
HCT: 32.5 % — ABNORMAL LOW (ref 36.0–46.0)
Hemoglobin: 10.6 g/dL — ABNORMAL LOW (ref 12.0–15.0)
Immature Granulocytes: 0 %
Lymphocytes Relative: 40 %
Lymphs Abs: 2 10*3/uL (ref 0.7–4.0)
MCH: 27 pg (ref 26.0–34.0)
MCHC: 32.6 g/dL (ref 30.0–36.0)
MCV: 82.9 fL (ref 80.0–100.0)
Monocytes Absolute: 0.3 10*3/uL (ref 0.1–1.0)
Monocytes Relative: 5 %
Neutro Abs: 2.7 10*3/uL (ref 1.7–7.7)
Neutrophils Relative %: 53 %
Platelets: 161 10*3/uL (ref 150–400)
RBC: 3.92 MIL/uL (ref 3.87–5.11)
RDW: 15.1 % (ref 11.5–15.5)
WBC: 5.1 10*3/uL (ref 4.0–10.5)
nRBC: 0 % (ref 0.0–0.2)

## 2022-08-31 LAB — RPR: RPR Ser Ql: NONREACTIVE

## 2022-08-31 LAB — LIPID PANEL
Cholesterol: 142 mg/dL (ref 0–200)
HDL: 28 mg/dL — ABNORMAL LOW (ref >40)
LDL Cholesterol: 62 mg/dL (ref 0–99)
Total CHOL/HDL Ratio: 5.1 RATIO
Triglycerides: 258 mg/dL — ABNORMAL HIGH (ref <150)
VLDL: 52 mg/dL — ABNORMAL HIGH (ref 0–40)

## 2022-08-31 LAB — PHOSPHORUS: Phosphorus: 3.8 mg/dL (ref 2.5–4.6)

## 2022-08-31 LAB — GLUCOSE, CAPILLARY
Glucose-Capillary: 154 mg/dL — ABNORMAL HIGH (ref 70–99)
Glucose-Capillary: 302 mg/dL — ABNORMAL HIGH (ref 70–99)
Glucose-Capillary: 232 mg/dL — ABNORMAL HIGH (ref 70–99)
Glucose-Capillary: 231 mg/dL — ABNORMAL HIGH (ref 70–99)

## 2022-08-31 LAB — URINE CULTURE: Culture: <10000 — AB

## 2022-08-31 MED ORDER — POTASSIUM CHLORIDE CRYS ER 20 MEQ PO TBCR
40.0000 meq | EXTENDED_RELEASE_TABLET | Freq: Once | ORAL | Status: AC
  Administered 2022-08-31: 40 meq via ORAL
  Filled 2022-08-31: qty 2

## 2022-08-31 MED ORDER — POLYVINYL ALCOHOL 1.4 % OP SOLN: 1.0000 [drp] | OPHTHALMIC | Status: DC | PRN

## 2022-08-31 MED ORDER — MAGNESIUM SULFATE 4 GM/100ML IV SOLN
4.0000 g | Freq: Once | INTRAVENOUS | Status: AC
  Administered 2022-08-31: 4 g via INTRAVENOUS
  Filled 2022-08-31: qty 100

## 2022-08-31 NOTE — Progress Notes (Signed)
   08/31/22 2029  Spiritual Encounters  Type of Visit Initial  Care provided to: Patient  Reason for visit Advance directives   Left forms for advance directive with patient who stated she will discuss with family and mght return forms.

## 2022-08-31 NOTE — Discharge Instructions (Addendum)

## 2022-08-31 NOTE — Progress Notes (Signed)
PROGRESS NOTE    Taylor Benitez  [id number] DOB: [birth year] DOA: 08/29/2022 PCP: Abner Greenspan, MD  Chief Complaint  Patient presents with   Drug Overdose    Brief Narrative:    62 year old female with past medical history of chronic narcotic use/chronic pain, liver cirrhosis, cocaine abuse, rheumatoid arthritis, hypothyroidism, HTN, diabetes mellitus, panic attacks, and history of overdose.  Today she was to have met up with Darika Ildefonso friend, when she no-showed, her friend went to the house, where the patient was found down. She was never unresponsive just very confused. show.  No one had heard from her in over 24 hours. In the house there were several empty bottles of baclofen laying on the ground.  On presentation to the ER she had left sided facial droop and weakness in the left arm and leg.  Code stroke was not called as there was no last known normal.  Assessment & Plan:   Principal Problem:   Acute metabolic encephalopathy Active Problems:   Type 2 diabetes mellitus with hyperglycemia ([place])   H/O drug overdose   Essential hypertension   Chronic narcotic use   Anxiety attack   Acute cystitis   Cocaine use  Acute Toxic and Metabolic Encephalopathy  Baclofen Overdose She admits to baclofen overdose, unintentional - she took more than she was supposed to - no intent to self harm and suicide Last percocet fill 2/7, per report, she was out of this.  Utox positive only for cocaine.  Binge drinker when she has money for etoh, about 2x Sabrina Keough month).  On review of prior ED visits, she's got Depaul Arizpe hx of abusing pills (01/01/2022 ED visit).  MRI brain without acute intracranial process Will follow EEG Supportive care for now Negative ammonia.  B12 365 (follow MMA), RPR, folate wnl. B1 level.   High dose thiamine Delirium precautions Pyuria on UA, follow urine culture, continue ceftriaxone for now Follow blood cultures PT/OT recommending SNF Will need to resume baclofen to avoid withdrawal as  this was chronic med she took daily, will discuss with pharmacy taper  Concern for atrial fibrillation at admission EKG poor quality with artifact, but P waves noted Will review telemetry - no fib noted Hold eliquis, anticoagulation in this patient with high risk of falls with frequent presentations for overdose would be high risk  Hypothyroidism Abnormal thyroid function tests - may need reduction in dose of synthroid Will discuss with patient and follow  T2DM Holding metformin and jardiance SSI  Chronic Pain Last percocet fill was 2/7. Current taking baclofen  Polysubstance Abuse Cocaine, pain meds Encourage cessation    DVT prophylaxis: eliquis Code Status: full Family Communication: Manuela Schwartz [phone] (roommate), also spoke with Clarene Critchley Disposition:   Status is: Inpatient Remains inpatient appropriate because: continued need for inpatient treatment   Consultants:  none  Procedures:  none  Antimicrobials:  Anti-infectives (From admission, onward)    Start     Dose/Rate Route Frequency Ordered Stop   08/31/22 0800  cefTRIAXone (ROCEPHIN) 1 g in sodium chloride 0.9 % 100 mL IVPB        1 g 200 mL/hr over 30 Minutes Intravenous Every 24 hours 08/30/22 0627     08/30/22 0630  cefTRIAXone (ROCEPHIN) 1 g in sodium chloride 0.9 % 100 mL IVPB  Status:  Discontinued        1 g 200 mL/hr over 30 Minutes Intravenous Every 24 hours 08/30/22 0627 08/30/22 0627   08/30/22 0600  cefTRIAXone (ROCEPHIN) 2 g in  sodium chloride 0.9 % 100 mL IVPB        2 g 200 mL/hr over 30 Minutes Intravenous  Once 08/30/22 0555 08/30/22 0652       Subjective: Improved, alert No new complaints Feels better  Objective: Vitals:   08/30/22 0246 08/30/22 0500 08/30/22 0506 08/30/22 0615  BP:  (!) 141/81  (!) 151/73  Pulse:  96  87  Resp:  14  15  Temp: 98.3 F (36.8 C)  99.3 F (37.4 C)   TempSrc: Oral  Rectal   SpO2:  92%  98%  Weight:      Height:        Intake/Output  Summary (Last 24 hours) at 08/30/2022 0845 Last data filed at 08/30/2022 0801 Gross per 24 hour  Intake 3133.42 ml  Output --  Net 3133.42 ml   Filed Weights   08/29/22 2236  Weight: 63.5 kg    Examination:  General: No acute distress. Cardiovascular: RRR Lungs: unlabored Abdomen: Soft, nontender, nondistended Neurological: Alert and oriented 3. Moves all extremities 4 with equal strength. Cranial nerves II through XII grossly intact. Extremities: No clubbing or cyanosis. No edema.  Data Reviewed: I have personally reviewed following labs and imaging studies  CBC: Recent Labs  Lab 08/29/22 2310  WBC 7.0  NEUTROABS 5.3  HGB 13.1  HCT 40.4  MCV 84.2  PLT 99991111    Basic Metabolic Panel: Recent Labs  Lab 08/29/22 2310  NA 140  K 4.1  CL 105  CO2 24  GLUCOSE 169*  BUN 15  CREATININE 1.11*  CALCIUM 9.5  MG 1.7    GFR: Estimated Creatinine Clearance: 44.9 mL/min (Tykia Mellone) (by C-G formula based on SCr of 1.11 mg/dL (H)).  Liver Function Tests: Recent Labs  Lab 08/29/22 2310  AST 21  ALT 21  ALKPHOS 75  BILITOT 0.9  PROT 7.1  ALBUMIN 3.8    CBG: Recent Labs  Lab 08/29/22 2323 08/30/22 0802  GLUCAP 165* 140*     No results found for this or any previous visit (from the past 240 hour(s)).       Radiology Studies: MR BRAIN WO CONTRAST  Result Date: 08/30/2022 CLINICAL DATA:  Stroke suspected EXAM: MRI HEAD WITHOUT CONTRAST TECHNIQUE: Multiplanar, multiecho pulse sequences of the brain and surrounding structures were obtained without intravenous contrast. COMPARISON:  No prior MRI available, correlation is made with CT head 08/29/2022 FINDINGS: Brain: No restricted diffusion to suggest acute or subacute infarct. No acute hemorrhage, mass, mass effect, or midline shift. No hydrocephalus or extra-axial collection. Normal craniocervical junction. Focus of susceptibility in the left frontal lobe (series 12, image 37), which may represent the sequela of Roberth Berling prior  microhemorrhage. Scattered T2 hyperintense signal in the periventricular white matter, likely the sequela of mild chronic small vessel ischemic disease. Vascular: Normal arterial flow voids. Skull and upper cervical spine: Normal marrow signal. Sinuses/Orbits: Small mucous retention cyst in the anterior left ethmoid air cells. Otherwise clear paranasal sinuses. No acute finding in the orbits. Other: Trace fluid in right mastoid air cells. IMPRESSION: No acute intracranial process. No evidence of acute or subacute infarct. Electronically Signed   By: Merilyn Baba M.D.   On: 08/30/2022 02:17   DG Abdomen 1 View  Result Date: 08/30/2022 CLINICAL DATA:  Screening for MRI.  Possible overdose on baclofen. EXAM: ABDOMEN - 1 VIEW COMPARISON:  None Available. FINDINGS: The bowel gas pattern is normal. No radio-opaque calculi or other significant radiographic abnormality are seen. Overlying telemetry  leads. No radiopaque foreign body. Bilateral THAs. IMPRESSION: 1. No radiopaque foreign body which would be Victoria Euceda contraindication to MRI. Electronically Signed   By: Placido Sou M.D.   On: 08/30/2022 01:14   CT HEAD WO CONTRAST  Result Date: 08/29/2022 CLINICAL DATA:  Neuro deficit. EXAM: CT HEAD WITHOUT CONTRAST TECHNIQUE: Contiguous axial images were obtained from the base of the skull through the vertex without intravenous contrast. RADIATION DOSE REDUCTION: This exam was performed according to the departmental dose-optimization program which includes automated exposure control, adjustment of the mA and/or kV according to patient size and/or use of iterative reconstruction technique. COMPARISON:  Head CT 04/01/2022 FINDINGS: Brain: No evidence of acute infarction, hemorrhage, hydrocephalus, extra-axial collection or mass lesion/mass effect. Vascular: No hyperdense vessel or unexpected calcification. Skull: Normal. Negative for fracture or focal lesion. Sinuses/Orbits: No acute finding. There has an old left medial  orbital wall fracture. Other: None. IMPRESSION: No acute intracranial abnormality. Electronically Signed   By: Ronney Asters M.D.   On: 08/29/2022 23:05   DG Chest Port 1 View  Result Date: 08/29/2022 CLINICAL DATA:  Overdose. EXAM: PORTABLE CHEST 1 VIEW COMPARISON:  Chest radiograph dated 04/01/2022. FINDINGS: No focal consolidation, pleural effusion, pneumothorax. The cardiac silhouette is within limits. No acute osseous pathology. Degenerative changes of the spine and shoulders. IMPRESSION: No active disease. Electronically Signed   By: Anner Crete M.D.   On: 08/29/2022 23:00        Scheduled Meds:  [START ON 08/31/2022]  stroke: early stages of recovery book   Does not apply Once   apixaban  5 mg Oral BID   folic acid  1 mg Oral Daily   insulin aspart  0-15 Units Subcutaneous TID WC   insulin aspart  0-5 Units Subcutaneous QHS   [START ON 09/08/2022] thiamine  100 mg Oral Daily   Continuous Infusions:  [START ON 08/31/2022] cefTRIAXone (ROCEPHIN)  IV     lactated ringers 100 mL/hr at 08/30/22 [id number]   thiamine (VITAMIN B1) injection     Followed by   Derrill Memo ON 09/03/2022] thiamine (VITAMIN B1) injection       LOS: 0 days    Time spent: over 30 min    Fayrene Helper, MD [facility]   To contact the attending provider between 7A-7P or the covering provider during after hours 7P-7A, please log into the web site [email or web address] and access using universal [place] password for that web site. If you do not have the password, please call the hospital operator.  08/30/2022, 8:45 AM

## 2022-08-31 NOTE — Evaluation (Signed)
Occupational Therapy Evaluation Patient Details Name: Taylor Benitez MRN: [id number] DOB: 1960/10/20 Today's Date: 08/31/2022   History of Present Illness 62 yo female with onset of medication overdose was admitted 3/8 for management of medical needs.  Has UTI, confusion, difficulty with giving history, cocaine use, narcotic use.  PMHx:  cocaine use, DM, HTN, a-fib, hypothyroidism, narcotic use, B THA's, panic attacks, SBO, OA, L great toe osteomyelitis, dentures, diverticulosis, PN   Clinical Impression   PTA pt lives with a roommate and her roommate's  son. Taylor Benitez states she uses a cane at baseline and is modified independent with mobility and ADL tasks. Pt with significantly improved cognition.Pt reports she "messed up her medication". Discussed keeping her "pain meds in a locked envelope and hiding them from others". Pt reports sometimes "forgetting" that she has taken her medication and "takes too much". Currently requires min A for ADL tasks and mobility. Able to ambulate @ 150 ft holding her IV pole while walking to the nursing station. At this time recommend SNF for rehab and to maximize functional level of independence with IADL tasks to facilitate safe return home. Will follow acutely and further assess DC needs as pt progresses.  Pt reports difficulty with transportation.      Recommendations for follow up therapy are one component of a multi-disciplinary discharge planning process, led by the attending physician.  Recommendations may be updated based on patient status, additional functional criteria and insurance authorization.   Follow Up Recommendations  Skilled nursing-short term rehab (<3 hours/day) (pending progress)     Assistance Recommended at Discharge Intermittent Supervision/Assistance  Patient can return home with the following A little help with walking and/or transfers;A little help with bathing/dressing/bathroom;Assistance with cooking/housework;Direct supervision/assist  for medications management;Assist for transportation    Functional Status Assessment  Patient has had a recent decline in their functional status and demonstrates the ability to make significant improvements in function in a reasonable and predictable amount of time.  Equipment Recommendations  BSC/3in1    Recommendations for Other Services       Precautions / Restrictions Precautions Precautions: Fall      Mobility Bed Mobility Overal bed mobility: Needs Assistance Bed Mobility: Supine to Sit     Supine to sit: Supervision          Transfers Overall transfer level: Needs assistance   Transfers: Sit to/from Stand, Bed to chair/wheelchair/BSC Sit to Stand: Min guard     Step pivot transfers: Min assist            Balance Overall balance assessment: Needs assistance, History of Falls   Sitting balance-Leahy Scale: Good       Standing balance-Leahy Scale: Fair                             ADL either performed or assessed with clinical judgement   ADL Overall ADL's : Needs assistance/impaired Eating/Feeding: Independent   Grooming: Set up;Supervision/safety;Standing   Upper Body Bathing: Set up;Sitting   Lower Body Bathing: Minimal assistance;Sit to/from stand   Upper Body Dressing : Set up;Supervision/safety;Sitting   Lower Body Dressing: Minimal assistance;Sit to/from stand Lower Body Dressing Details (indicate cue type and reason): diffiuclty with doning socks at baseline; would benefit from AE Toilet Transfer: Minimal assistance;Ambulation   Toileting- Clothing Manipulation and Hygiene: Minimal assistance;Sit to/from stand       Functional mobility during ADLs: Minimal assistance       Vision  Additional Comments: states she needs glasses     Perception     Praxis      Pertinent Vitals/Pain Pain Assessment Pain Assessment: Faces Faces Pain Scale: Hurts a little bit Pain Location: generalized Pain Descriptors /  Indicators: Discomfort Pain Intervention(s): Limited activity within patient's tolerance     Hand Dominance Right   Extremity/Trunk Assessment Upper Extremity Assessment Upper Extremity Assessment: Generalized weakness   Lower Extremity Assessment Lower Extremity Assessment: Defer to PT evaluation   Cervical / Trunk Assessment Cervical / Trunk Assessment: Normal   Communication     Cognition Arousal/Alertness: Awake/alert Behavior During Therapy: WFL for tasks assessed/performed Overall Cognitive Status: No family/caregiver present to determine baseline cognitive functioning                                 General Comments: Pt alert adn oriented x 4; appropirate conversation about situation and pt reports taking too much medicine; not pt has difficulty with mental flexibiility however cognition has improved significantly; Pt unableo to recall 3/5 wrods however she states she has memory deficits at baseline     General Comments       Exercises     Shoulder Instructions      Home Living Family/patient expects to be discharged to:: Private residence Living Arrangements: Non-relatives/Friends Available Help at Discharge: Friend(s);Available PRN/intermittently (room mate is currently in the hospital as well) Type of Home: House Home Access: Stairs to enter [employer] of Steps: 2 Entrance Stairs-Rails: Right Home Layout: One level     Bathroom Shower/Tub: Teacher, early years/pre: Standard Bathroom Accessibility: No   Home Equipment: Cane - single point          Prior Functioning/Environment Prior Level of Function : Independent/Modified Independent;History of Falls (last six months)             Mobility Comments: uses a cane for mobility; fear of falling; has groceries delivered; takes an [place] to appointments if needed; has a cat at home          OT Problem List: Decreased strength;Decreased activity tolerance;Impaired  balance (sitting and/or standing);Impaired vision/perception;Decreased cognition;Decreased safety awareness;Decreased knowledge of use of DME or AE;Pain      OT Treatment/Interventions: Self-care/ADL training;Therapeutic exercise;DME and/or AE instruction;Therapeutic activities;Cognitive remediation/compensation;Patient/family education;Balance training    OT Goals(Current goals can be found in the care plan section) Acute Rehab OT Goals Patient Stated Goal: to get better OT Goal Formulation: With patient Time For Goal Achievement: 09/14/22 Potential to Achieve Goals: Good  OT Frequency: Min 2X/week    Co-evaluation              AM-PAC OT "6 Clicks" Daily Activity     Outcome Measure Help from another person eating meals?: None Help from another person taking care of personal grooming?: A Little Help from another person toileting, which includes using toliet, bedpan, or urinal?: A Little Help from another person bathing (including washing, rinsing, drying)?: A Little Help from another person to put on and taking off regular upper body clothing?: A Little Help from another person to put on and taking off regular lower body clothing?: A Little 6 Click Score: 19   End of Session Equipment Utilized During Treatment: Gait belt Nurse Communication: Mobility status;Other (comment) (encourage ambulation)  Activity Tolerance: Patient tolerated treatment well Patient left: in chair;with call bell/phone within reach;with chair alarm set  OT Visit Diagnosis: Unsteadiness on feet (  R26.81);Muscle weakness (generalized) (M62.81);History of falling (Z91.81);Other symptoms and signs involving cognitive function;Pain Pain - part of body:  (generalized)                Time: [id number] OT Time Calculation (min): 30 min Charges:  OT General Charges $OT Visit: 1 Visit OT Evaluation $OT Eval Moderate Complexity: 1 Mod OT Treatments $Self Care/Home Management : 8-22 mins  Maurie Boettcher, OT/L    Acute OT Clinical Specialist [place] Pager [phone] Office [phone]   [facility] 08/31/2022, 3:00 PM

## 2022-09-01 ENCOUNTER — Other Ambulatory Visit (HOSPITAL_COMMUNITY): Payer: Self-pay

## 2022-09-01 LAB — GLUCOSE, CAPILLARY
Glucose-Capillary: 120 mg/dL — ABNORMAL HIGH (ref 70–99)
Glucose-Capillary: 301 mg/dL — ABNORMAL HIGH (ref 70–99)
Glucose-Capillary: 139 mg/dL — ABNORMAL HIGH (ref 70–99)

## 2022-09-01 LAB — HEMOGLOBIN A1C
Hgb A1c MFr Bld: 8 % — ABNORMAL HIGH (ref 4.8–5.6)
Mean Plasma Glucose: 183 mg/dL

## 2022-09-01 MED ORDER — OXYCODONE HCL 5 MG PO TABS
5.0000 mg | ORAL_TABLET | Freq: Three times a day (TID) | ORAL | Status: DC | PRN
  Administered 2022-09-02 – 2022-09-04 (×5): 5 mg via ORAL
  Filled 2022-09-01 (×5): qty 1

## 2022-09-01 MED ORDER — AMITRIPTYLINE HCL 25 MG PO TABS
100.0000 mg | ORAL_TABLET | Freq: Every day | ORAL | Status: DC
  Administered 2022-09-01 – 2022-09-03 (×3): 100 mg via ORAL
  Filled 2022-09-01 (×3): qty 4

## 2022-09-01 MED ORDER — DIPHENHYDRAMINE HCL 25 MG PO CAPS
25.0000 mg | ORAL_CAPSULE | Freq: Once | ORAL | Status: AC | PRN
  Administered 2022-09-02: 25 mg via ORAL
  Filled 2022-09-01: qty 1

## 2022-09-01 MED ORDER — BACLOFEN 10 MG PO TABS
5.0000 mg | ORAL_TABLET | Freq: Every day | ORAL | Status: DC | PRN
  Administered 2022-09-01 – 2022-09-04 (×5): 5 mg via ORAL
  Filled 2022-09-01 (×5): qty 1

## 2022-09-01 MED ORDER — LEVOTHYROXINE SODIUM 25 MCG PO TABS
125.0000 ug | ORAL_TABLET | Freq: Every day | ORAL | Status: DC
  Administered 2022-09-01 – 2022-09-04 (×4): 125 ug via ORAL
  Filled 2022-09-01 (×4): qty 1

## 2022-09-01 MED ORDER — AMITRIPTYLINE HCL 25 MG PO TABS: 200.0000 mg | ORAL_TABLET | Freq: Every day | ORAL | Status: DC

## 2022-09-01 NOTE — NC FL2 (Signed)
[place] LEVEL OF CARE FORM     IDENTIFICATION  Patient Name: Taylor Benitez Birthdate: 30-Aug-1960 Sex: female Admission Date (Current Location): 08/29/2022  [facility] and [place] Number:  Herbalist and Address:  [facility] [place] [place] [address], [place], [place] [address]      Provider Number: [id number]  Attending Physician Name and Address:  Lavina Hamman, MD  Relative Name and Phone Number:       Current Level of Care: Hospital Recommended Level of Care: [place] Prior Approval Number:    Date Approved/Denied:   PASRR Number: [id number]  Discharge Plan: SNF    Current Diagnoses: Patient Active Problem List   Diagnosis Date Noted   Acute metabolic encephalopathy [id number]   Acute cystitis 08/30/2022   Cocaine use 08/30/2022   Primary osteoarthritis of left hip 11/02/2019   Other cirrhosis of liver ([place]) 11/17/2016   History of small bowel obstruction 11/14/2016   Chronic narcotic use 01/09/2016   H/O drug overdose    Thyroid disease 12/09/2015   Type 2 diabetes mellitus with hyperglycemia ([place]) 12/09/2015   Arthritis 12/09/2015   Essential hypertension 01/09/2015   Chronic pain 04/19/2014   Insomnia 03/21/2014   Anxiety attack 11/29/2013   Seronegative rheumatoid arthritis ([place]) 11/16/2013   Peripheral neuropathy 11/16/2013   Osteoarthritis of left knee 11/16/2013   NONSPECIFIC ABN FINDING RAD & OTH EXAM GI TRACT 01/30/2009   WHEEZING 06/30/2007    Orientation RESPIRATION BLADDER Height & Weight     Self, Time, Situation, Place  Normal Continent Weight: 139 lb 15.9 oz (63.5 kg) Height:  '5\' 1"'$  (154.9 cm)  BEHAVIORAL SYMPTOMS/MOOD NEUROLOGICAL BOWEL NUTRITION STATUS      Continent Diet (carb modified)  AMBULATORY STATUS COMMUNICATION OF NEEDS Skin   Extensive Assist Verbally Normal                       Personal Care Assistance Level of Assistance  Bathing, Feeding, Dressing Bathing  Assistance: Limited assistance Feeding assistance: Limited assistance Dressing Assistance: Limited assistance     Functional Limitations Info             SPECIAL CARE FACTORS FREQUENCY  PT (By licensed PT), OT (By licensed OT)     PT Frequency: 5x/wk OT Frequency: 5x/wk            Contractures Contractures Info: Not present    Additional Factors Info  Code Status, Allergies, Insulin Sliding Scale Code Status Info: Full Allergies Info: Glipizide, Metformin And Related, Penicillins   Insulin Sliding Scale Info: see DC summary       Current Medications (09/01/2022):  This is the current hospital active medication list Current Facility-Administered Medications  Medication Dose Route Frequency Provider Last Rate Last Admin   acetaminophen (TYLENOL) tablet 650 mg  650 mg Oral Q4H PRN Quintella Baton, MD   650 mg at 08/30/22 2131   Or   acetaminophen (TYLENOL) 160 MG/5ML solution 650 mg  650 mg Per Tube Q4H PRN Crosley, Debby, MD       Or   acetaminophen (TYLENOL) suppository 650 mg  650 mg Rectal Q4H PRN Crosley, Debby, MD       baclofen (LIORESAL) tablet 5 mg  5 mg Oral Daily PRN Lavina Hamman, MD       folic acid (FOLVITE) tablet 1 mg  1 mg Oral Daily Crosley, Debby, MD   1 mg at 09/01/22 1023  insulin aspart (novoLOG) injection 0-15 Units  0-15 Units Subcutaneous TID WC Quintella Baton, MD   5 Units at 08/31/22 1819   insulin aspart (novoLOG) injection 0-5 Units  0-5 Units Subcutaneous QHS Crosley, Debby, MD       metoprolol tartrate (LOPRESSOR) injection 5 mg  5 mg Intravenous Q4H PRN Crosley, Debby, MD       ondansetron (ZOFRAN) tablet 4 mg  4 mg Oral Q6H PRN Crosley, Debby, MD       Or   ondansetron (ZOFRAN) injection 4 mg  4 mg Intravenous Q6H PRN Crosley, Debby, MD       polyvinyl alcohol (LIQUIFILM TEARS) 1.4 % ophthalmic solution 1 drop  1 drop Both Eyes PRN Elodia Florence., MD       senna-docusate (Senokot-S) tablet 1 tablet  1 tablet Oral QHS PRN  Quintella Baton, MD       thiamine (VITAMIN B1) 500 mg in sodium chloride 0.9 % 50 mL IVPB  500 mg Intravenous Q8H Elodia Florence., MD 100 mL/hr at 09/01/22 0559 500 mg at 09/01/22 0559   Followed by   Derrill Memo ON 09/03/2022] thiamine (VITAMIN B1) 250 mg in sodium chloride 0.9 % 50 mL IVPB  250 mg Intravenous Daily Elodia Florence., MD       Followed by   Derrill Memo ON 09/08/2022] thiamine (VITAMIN B1) tablet 100 mg  100 mg Oral Daily Elodia Florence., MD         Discharge Medications: Please see discharge summary for a list of discharge medications.  Relevant Imaging Results:  Relevant Lab Results:   Additional Information SS#: [id number]  Geralynn Ochs, LCSW

## 2022-09-01 NOTE — TOC Benefit Eligibility Note (Signed)
Patient Advocate Encounter  Insurance verification completed.    The patient is currently admitted and upon discharge could be taking Eliquis 5 mg.  The current 30 day co-pay is $0.00.   The patient is insured through [employer] Medicare Part D   Kanye Depree, CPHT Pharmacy Patient Advocate Specialist [facility] Patient Advocate Team Direct Number: [phone]  Fax: [phone]       

## 2022-09-01 NOTE — Progress Notes (Signed)
Inpatient Diabetes Program Recommendations  AACE/ADA: New Consensus Statement on Inpatient Glycemic Control (2015)  Target Ranges:  Prepandial:   less than 140 mg/dL      Peak postprandial:   less than 180 mg/dL (1-2 hours)      Critically ill patients:  140 - 180 mg/dL   Lab Results  Component Value Date   GLUCAP 120 (H) 09/01/2022   HGBA1C 7.6 (H) 10/26/2019    Review of Glycemic Control  Latest Reference Range & Units 08/31/22 06:19 08/31/22 12:39 08/31/22 17:25 08/31/22 21:16 09/01/22 06:08  Glucose-Capillary 70 - 99 mg/dL 154 (H) 302 (H) 232 (H) 231 (H) 120 (H)   Diabetes history: DM  Outpatient Diabetes medications:  Jardiance 25 mg daily, Metformin 1000 mg bid Current orders for Inpatient glycemic control:  Novolog 0-15 units tid with meals and HS  Inpatient Diabetes Program Recommendations:   Consider changing diet to CHO modified diet. Also may consider adding Novolog meal coverage 3 units tid with meals (hold if patient eats less than 50% or NPO).   Thanks  Adah Perl, RN, BC-ADM Inpatient Diabetes Coordinator Pager [phone]  (8a-5p)

## 2022-09-01 NOTE — Hospital Course (Addendum)
PMH of chronic pain syndrome on chronic opioids, liver cirrhosis, rheumatoid arthritis, HTN, hypothyroidism, type II DM, cocaine abuse, alcohol abuse as well as marijuana abuse. Frequent presentation with substance abuse to ER or hospitalization.  1/10-alcohol abuse 7/11-encephalopathy due to prescription drug use. 10/5-confusion and encephalopathy with a fall with concern for possible drug and alcohol use. 3/8-this admission presents with possible baclofen overdose and encephalopathy.  There is also concern for left-sided facial droop and left-sided weakness concerning for a possible stroke.  MRI brain negative for acute stroke. Currently mentation improving.  No deficit.

## 2022-09-01 NOTE — TOC Initial Note (Signed)
Transition of Care [facility]) - Initial/Assessment Note    Patient Details  Name: Taylor Benitez MRN: [id number] Date of Birth: 05-29-1961  Transition of Care [facility]) CM/SW Contact:    Geralynn Ochs, LCSW Phone Number: 09/01/2022, 3:56 PM  Clinical Narrative:       CSW met with patient to discuss recommendation for SNF. Patient in agreement, would like to get into [facility] if possible, as her mother is there. CSW sent referral, [facility] declined due to substance abuse. CSW faxed referral out further. No bed offers in [place], [place] faxed out further. CSW to follow.             Expected Discharge Plan: Skilled Nursing Facility Barriers to Discharge: Ship broker, Continued Medical Work up, Active Substance Use - Placement   Patient Goals and CMS Choice Patient states their goals for this hospitalization and ongoing recovery are:: to get better CMS Medicare.gov Compare Post Acute Care list provided to:: Patient Choice offered to / list presented to : Patient [place] ownership interest in [facility].provided to:: Patient    Expected Discharge Plan and Services     Post Acute Care Choice: [place] Living arrangements for the past 2 months: Apartment                                      Prior Living Arrangements/Services Living arrangements for the past 2 months: Apartment Lives with:: Roommate Patient language and need for interpreter reviewed:: No Do you feel safe going back to the place where you live?: Yes      Need for Family Participation in Patient Care: No (Comment) Care giver support system in place?: No (comment)   Criminal Activity/Legal Involvement Pertinent to Current Situation/Hospitalization: No - Comment as needed  Activities of Daily Living Home Assistive Devices/Equipment: Walker (specify type), Cane (specify quad or straight), Dentures (specify type), CBG Meter, Hand-held shower hose ADL Screening  (condition at time of admission) Patient's cognitive ability adequate to safely complete daily activities?: Yes Is the patient deaf or have difficulty hearing?: No Does the patient have difficulty seeing, even when wearing glasses/contacts?: No Does the patient have difficulty concentrating, remembering, or making decisions?: No Patient able to express need for assistance with ADLs?: Yes Does the patient have difficulty dressing or bathing?: No Independently performs ADLs?: Yes (appropriate for developmental age) Does the patient have difficulty walking or climbing stairs?: No Weakness of Legs: Both Weakness of Arms/Hands: Both  Permission Sought/Granted Permission sought to share information with : Facility Sport and exercise psychologist, Family Supports Permission granted to share information with : Yes, Verbal Permission Granted     Permission granted to share info w AGENCY: SNF        Emotional Assessment Appearance:: Appears stated age Attitude/Demeanor/Rapport: Engaged Affect (typically observed): Appropriate Orientation: : Oriented to Self, Oriented to Place, Oriented to Situation, Oriented to  Time Alcohol / Substance Use: Illicit Drugs Psych Involvement: No (comment)  Admission diagnosis:  Encephalopathy [G93.40] Acute cystitis without hematuria [id number] Acute metabolic encephalopathy [id number] Patient Active Problem List   Diagnosis Date Noted   Acute metabolic encephalopathy A999333   Acute cystitis 08/30/2022   Cocaine use 08/30/2022   Primary osteoarthritis of left hip 11/02/2019   Other cirrhosis of liver ([place]) 11/17/2016   History of small bowel obstruction 11/14/2016   Chronic narcotic use 01/09/2016   H/O drug overdose    Thyroid  disease 12/09/2015   Type 2 diabetes mellitus with hyperglycemia ([place]) 12/09/2015   Arthritis 12/09/2015   Essential hypertension 01/09/2015   Chronic pain 04/19/2014   Insomnia 03/21/2014   Anxiety attack 11/29/2013   Seronegative  rheumatoid arthritis ([place]) 11/16/2013   Peripheral neuropathy 11/16/2013   Osteoarthritis of left knee 11/16/2013   NONSPECIFIC ABN FINDING RAD & OTH EXAM GI TRACT 01/30/2009   WHEEZING 06/30/2007   PCP:  Abner Greenspan, MD Pharmacy:   [place] [place] [address] [place] [address] Phone: [phone] Fax: [place], [place] - [address] AT [facility] OF [address] [place] [address] Phone: [phone] Fax: [phone]     Social Determinants of Health (SDOH) Social History: SDOH Screenings   Food Insecurity: Food Insecurity Present (08/30/2022)  Housing: High Risk (08/30/2022)  Transportation Needs: Unmet Transportation Needs (09/01/2022)  Utilities: Not At Risk (08/30/2022)  Depression (PHQ2-9): Low Risk  (03/28/2019)  Tobacco Use: Medium Risk (08/30/2022)   SDOH Interventions:     Readmission Risk Interventions     No data to display

## 2022-09-01 NOTE — Progress Notes (Signed)
Occupational Therapy Treatment Patient Details Name: NYHA PADO MRN: [id number] DOB: [birth year] Today's Date: 09/01/2022   History of present illness 62 yo female with onset of medication overdose was admitted 3/8 for management of medical needs.  Has UTI, confusion, difficulty with giving history, cocaine use, narcotic use.  PMHx:  cocaine use, DM, HTN, a-fib, hypothyroidism, narcotic use, B THA's, panic attacks, SBO, OA, L great toe osteomyelitis, dentures, diverticulosis, PN   OT comments  Patient continues to make steady progress towards goals in skilled OT session. Patient's session encompassed upper level cognitive tasks with use of pillbox assessment. Patient continues to endorse memory issues at baseline, however able to complete pillbox assessment with no errors, though required extra time (6 minutes and 53 seconds). Receptive to ideas and strategies to promote increased safety and independence in medication management. Patient's discharge remains complex due to roommate now being in the hospital and patient does not feel she is currently at a level to discharge independently. OT recommendation remains appropriate, however could potentially return home with home health pending progress.     Recommendations for follow up therapy are one component of a multi-disciplinary discharge planning process, led by the attending physician.  Recommendations may be updated based on patient status, additional functional criteria and insurance authorization.    Follow Up Recommendations  Skilled nursing-short term rehab (<3 hours/day) (pending progress)     Assistance Recommended at Discharge Intermittent Supervision/Assistance  Patient can return home with the following  A little help with walking and/or transfers;A little help with bathing/dressing/bathroom;Assistance with cooking/housework;Direct supervision/assist for medications management;Assist for transportation   Equipment  Recommendations  BSC/3in1    Recommendations for Other Services      Precautions / Restrictions Precautions Precautions: Fall       Mobility Bed Mobility Overal bed mobility: Needs Assistance Bed Mobility: Supine to Sit     Supine to sit: Supervision          Transfers Overall transfer level: Needs assistance                       Balance Overall balance assessment: Needs assistance, History of Falls   Sitting balance-Leahy Scale: Good       Standing balance-Leahy Scale: Fair                             ADL either performed or assessed with clinical judgement   ADL Overall ADL's : Needs assistance/impaired Eating/Feeding: Independent   Grooming: Set up;Supervision/safety;Standing                               Functional mobility during ADLs: Minimal assistance General ADL Comments: Session focus on upper level cognitive tasks    Extremity/Trunk Assessment         Cervical / Trunk Assessment Cervical / Trunk Assessment: Normal    Vision       Perception     Praxis      Cognition Arousal/Alertness: Awake/alert Behavior During Therapy: WFL for tasks assessed/performed Overall Cognitive Status: No family/caregiver present to determine baseline cognitive functioning                                 General Comments: Patient continues to endorse memory issues at baseline, however able to complete pillbox assessment with no errors.  Receptive to ideas and strategies to promote increased safety and independence in medication management.        Exercises      Shoulder Instructions       General Comments      Pertinent Vitals/ Pain       Pain Assessment Pain Assessment: Faces Faces Pain Scale: No hurt Pain Intervention(s): Monitored during session  Home Living Family/patient expects to be discharged to:: Private residence Living Arrangements: Non-relatives/Friends Available Help at Discharge:  Friend(s);Available PRN/intermittently (room mate is currently in the hospital as well) Type of Home: House Home Access: Stairs to enter [employer] of Steps: 2 Entrance Stairs-Rails: Right Home Layout: One level     Bathroom Shower/Tub: Teacher, early years/pre: Standard Bathroom Accessibility: No   Home Equipment: Kasandra Knudsen - single point      Lives With: Friend(s);Other (Comment) (roommate; her son and his girlfriend also living on their couch)    Prior Functioning/Environment              Frequency  Min 2X/week        Progress Toward Goals  OT Goals(current goals can now be found in the care plan section)  Progress towards OT goals: Progressing toward goals  Acute Rehab OT Goals Patient Stated Goal: to get all this figured out OT Goal Formulation: With patient Time For Goal Achievement: 09/14/22 Potential to Achieve Goals: Good  Plan Discharge plan remains appropriate    Co-evaluation                 AM-PAC OT "6 Clicks" Daily Activity     Outcome Measure   Help from another person eating meals?: None Help from another person taking care of personal grooming?: A Little Help from another person toileting, which includes using toliet, bedpan, or urinal?: A Little Help from another person bathing (including washing, rinsing, drying)?: A Little Help from another person to put on and taking off regular upper body clothing?: A Little Help from another person to put on and taking off regular lower body clothing?: A Little 6 Click Score: 19    End of Session    OT Visit Diagnosis: Unsteadiness on feet (R26.81);Muscle weakness (generalized) (M62.81);History of falling (Z91.81);Other symptoms and signs involving cognitive function;Pain   Activity Tolerance Patient tolerated treatment well   Patient Left with call bell/phone within reach;in bed;with bed alarm set   Nurse Communication Mobility status        Time: [id number] OT  Time Calculation (min): 31 min  Charges: OT General Charges $OT Visit: 1 Visit OT Treatments $Self Care/Home Management : 23-37 mins  [place]. Kaytlen Lightsey, OTR/L Acute Rehabilitation Services [phone]   Ascencion Dike 09/01/2022, 2:56 PM

## 2022-09-01 NOTE — Progress Notes (Addendum)
[facility] Progress Note Patient: Taylor Benitez [id number] DOB: 03-Jul-1960 DOA: 08/29/2022  DOS: the patient was seen and examined on 09/01/2022  Brief hospital course: PMH of chronic pain syndrome on chronic opioids, liver cirrhosis, rheumatoid arthritis, HTN, hypothyroidism, type II DM, cocaine abuse, alcohol abuse as well as marijuana abuse. Frequent presentation with substance abuse to ER or hospitalization.  1/10-alcohol abuse 7/11-encephalopathy due to prescription drug use. 10/5-confusion and encephalopathy with a fall with concern for possible drug and alcohol use. 3/8-this admission presents with possible baclofen overdose and encephalopathy.  There is also concern for left-sided facial droop and left-sided weakness concerning for a possible stroke.  MRI brain negative for acute stroke. Currently mentation improving.  No deficit. Assessment and Plan: Acute toxic encephalopathy Accidental baclofen overdose. Patient presented with confusion episode. She has baclofen prescribed 10 mg twice daily as needed but she reported that she is taking this medication 3-4 times every day. Per pharmacy, the last time this medication was filled at [facility] was in September for 60 tablets. Reportedly patient had access to baclofen which was prescribed 1 month ago per ED provider-I cannot verify this information-and the patient at the time of admission was missing 4 to 6 pills from them. Patient did not require any respiratory assistance and mentation has significantly improved. Patient has prior history of substance abuse and frequent hospitalization secondary to prescription medication abuse. At present I feel that the patient is not safe to represcribe baclofen for long-term duration. Will initiate 5 mg daily as needed and taper it off over next 7 days.  Concern for acute stroke. Ruled out. CT of the head unremarkable. MRI brain unremarkable. CT angio head and neck negative for any  large vessel occlusion.  Does have a left ICA 75% stenosis and is well as 60% stenosis in the right ICA. Would recommend full dose aspirin patient is not on any antithrombotic therapy prior to admission. Hemoglobin A1c 8.0.  Will attempt to aggressively treated. Lipid panel shows LDL of 70.  Not on any medication.  Will initiate Lipitor 10 mg daily.  Consent for A-fib-ruled out. Reportedly at the time of admission there was some concern for A-fib. Currently patient does not have any A-fib seen on telemetry. On EKG evaluation I do not see any A-fib as well. Would recommend outpatient 30-day monitoring due to her unresponsive event but I do not think that the patient has any A-fib and therefore currently no indication for any anticoagulation. Patient also remains at very high risk for fall-Me that she falls once every week-at present I do not recommend anticoagulation for someone who is falling frequently as well.  Polysubstance abuse. Patient's UDS is positive for cocaine. In the past the patient also had UDS positivity for cannabis. Patient also had reported to drink alcohol on a regular infrequent basis in the past. Counseled the patient to stop abusing substance like this as it could lead to more falls.  Prescription drug abuse. Patient is prescribed baclofen 1 twice daily as needed but reportedly is taking 3-4 on a daily basis. This information is not reliable as the patient has not filled this prescription in a more than last 4 months but patient may have had access to this medication "from street" Would recommend pain management providers to consider patient's high risk behavior while prescribing medication.  Mood disorder. Patient is on amitriptyline 200 mg nightly. This is a very high dose which can lead to recurrent fall as well. For now we will continue  with due to its chronicity but reduce the dose to 100 mg nightly only.  Chronic pain medication use. Patient follows up with  pain medication management clinic and is on oxy IR 5 mg which she reports taking 3 times daily as needed. Patient is UDS is actually negative for any opioids despite her claim that she takes this medication religiously 3 times a day. For now we will continue that medication.  Type 2 diabetes mellitus, uncontrolled with hyperglycemia, without long-term insulin use, without any complication. On metformin and Jardiance at home. Currently on sliding scale insulin here in the hospital. Will change diet from regular to carb modified.  Hypothyroidism. TSH 0.034.  Free T4 1.22.  Patient is on Synthroid 150 mcg.  Will reduce to 125 mcg.  Suspicion for acute cystitis. Patient is currently on Keflex. Will continue for a total 5-day treatment course.   Subjective: Denies any acute complaint.  No nausea no vomiting no fever no chills.  Reports muscle spasm and spasticity all over her body right now.  Physical Exam: General: in Mild distress, No Rash Cardiovascular: S1 and S2 Present, No Murmur Respiratory: Good respiratory effort, Bilateral Air entry present. No Crackles, No wheezes Abdomen: Bowel Sound present, No tenderness Extremities: No edema Neuro: Alert and oriented x3, no new focal deficit  Data Reviewed: I have Reviewed nursing notes, Vitals, and Lab results. Since last encounter, pertinent lab results CBC and BMP   . I have ordered test including BMP  .   Disposition: Status is: Inpatient Remains inpatient appropriate because: Awaiting SNF placement.  Patient lives alone at home.  Her roommate is currently hospitalized.  PT OT recommends SNF.   Family Communication: No one at bedside no family in the town Level of care: Telemetry Cardiac   Vitals:   08/31/22 2319 09/01/22 0352 09/01/22 0751 09/01/22 1142  BP: 138/78 (!) 153/78 (!) 159/89 (!) 158/85  Pulse: 79 74 74 81  Resp: '18 18 17 18  '$ Temp: 98 F (36.7 C) 97.9 F (36.6 C) 98.2 F (36.8 C) 98.4 F (36.9 C)  TempSrc: Oral  Oral Oral Oral  SpO2: 98% 99%  99%  Weight:      Height:         Author: Berle Mull, MD 09/01/2022 12:15 PM  Please look on [email or web address] to find out who is on call.

## 2022-09-01 NOTE — Evaluation (Signed)
Speech Language Pathology Evaluation Patient Details Name: GENOVIA MOLDENHAUER MRN: [id number] DOB: 1960-08-15 Today's Date: 09/01/2022 Time: 1029-1100 SLP Time Calculation (min) (ACUTE ONLY): 31 min  Problem List:  Patient Active Problem List   Diagnosis Date Noted   Acute metabolic encephalopathy A999333   Acute cystitis 08/30/2022   Cocaine use 08/30/2022   Primary osteoarthritis of left hip 11/02/2019   Other cirrhosis of liver ([place]) 11/17/2016   History of small bowel obstruction 11/14/2016   Chronic narcotic use 01/09/2016   H/O drug overdose    Thyroid disease 12/09/2015   Type 2 diabetes mellitus with hyperglycemia ([place]) 12/09/2015   Arthritis 12/09/2015   Essential hypertension 01/09/2015   Chronic pain 04/19/2014   Insomnia 03/21/2014   Anxiety attack 11/29/2013   Seronegative rheumatoid arthritis ([place]) 11/16/2013   Peripheral neuropathy 11/16/2013   Osteoarthritis of left knee 11/16/2013   NONSPECIFIC ABN FINDING RAD & OTH EXAM GI TRACT 01/30/2009   WHEEZING 06/30/2007   Past Medical History:  Past Medical History:  Diagnosis Date   Chronic narcotic use    Chronic pain    Cirrhosis of liver ([place]) 2010   followed by pcp  (07-30-2021  pt stated was told this many yrs ago but has no symptoms,  normal enzymes 07-02-2021 lab in epic)   Cocaine abuse ([place])    (07-30-2021  per pt average cocaine monthly, stated had not used since positive UDS 07-02-2021)   Full dentures    History of diverticulitis of colon 2006   s/p  hemicolectomy with colostomy for perferated sigmoid colon , takedown in 20074   History of drug overdose 2017   accidental narcotic   History of gastric ulcer 2010   History of panic attacks    History of small bowel obstruction 11/19/2016   s/p  exploratory laparotomy   Hypertension    Hypothyroidism    followed by pcp   Marijuana abuse    (07-30-2021  pt stated smokes weekly)   OA (osteoarthritis)    Osteomyelitis of great toe of left foot  ([place])    Peripheral neuropathy    hands and feet   Seronegative rheumatoid arthritis ([place])    Type 2 diabetes mellitus ([place])    followed by pcp   (07-30-2021  pt stated does not check blood sugar at home)   Past Surgical History:  Past Surgical History:  Procedure Laterality Date   AMPUTATION TOE Left 08/08/2021   Procedure: AMPUTATION LEFT FIRST TOE;  Surgeon: Jobe Igo, DPM;  Location: [facility];  Service: Podiatry;  Laterality: Left;  WITH ANKLE BLOCK   BREAST REDUCTION SURGERY Bilateral 1989   COLON SURGERY  2006   hemicolecotmy w/ colostomy;   takedown done in 2007   ('@[facility] -[place])   ESOPHAGOGASTRODUODENOSCOPY  01/30/2009   by dr Ardis Hughs   EXPLORATORY LAPAROTOMY  11/19/2016   '@[facility] -[place];  exploratory celiotomy  for SBO   TONSILLECTOMY     child   TOTAL HIP ARTHROPLASTY Right 2019   TOTAL HIP ARTHROPLASTY Left 11/02/2019   Procedure: TOTAL HIP ARTHROPLASTY ANTERIOR APPROACH;  Surgeon: Gaynelle Arabian, MD;  Location: [facility];  Service: Orthopedics;  Laterality: Left;  152mn   TOTAL KNEE ARTHROPLASTY Bilateral    left 11-06-2016;  right   TOTAL VAGINAL HYSTERECTOMY Bilateral 2005   per pt w/ bilateral salpingoophorectomy   HPI:  62yo female with onset of medication overdose was admitted 3/8 for management of medical needs.  Has UTI, confusion, difficulty with giving history,  cocaine use, narcotic use.  PMHx:  cocaine use, DM, HTN, a-fib, hypothyroidism, narcotic use, B THA's, panic attacks, SBO, OA, L great toe osteomyelitis, dentures, diverticulosis, PN   Assessment / Plan / Recommendation Clinical Impression  Pt believes that she is back at her cognitive baseline, acknowledging that this has been declining over the last few years. She seems to have some good awareness of difficulties, anticipating the sections on the SLUMS that would give her trouble before attempting them (scoring 20/30 overall). She had significant difficulty with clock drawing task,  also demonstrating difficulties in areas requiring more problem solving, working memory, and recall. She can get tangential and needed cues throughout testing to sustain her attention. Given her decline over the last few years, recommend f/u SLP, although this can be done at next venue of care given that she does not appear to have acute changes at this time.    SLP Assessment  SLP Recommendation/Assessment: All further Speech Lanaguage Pathology  needs can be addressed in the next venue of care SLP Visit Diagnosis: Cognitive communication deficit (R41.841)    Recommendations for follow up therapy are one component of a multi-disciplinary discharge planning process, led by the attending physician.  Recommendations may be updated based on patient status, additional functional criteria and insurance authorization.    Follow Up Recommendations  Skilled nursing-short term rehab (<3 hours/day)    Assistance Recommended at Discharge  Frequent or constant Supervision/Assistance  Functional Status Assessment Patient has had a recent decline in their functional status and demonstrates the ability to make significant improvements in function in a reasonable and predictable amount of time.  Frequency and Duration           SLP Evaluation Cognition  Overall Cognitive Status: No family/caregiver present to determine baseline cognitive functioning Arousal/Alertness: Awake/alert Orientation Level: Oriented X4 Attention: Sustained Sustained Attention: Impaired Sustained Attention Impairment: Verbal complex Memory: Impaired Memory Impairment: Decreased recall of new information;Retrieval deficit Awareness: Appears intact Problem Solving: Impaired Problem Solving Impairment: Verbal complex Safety/Judgment: Appears intact       Comprehension  Auditory Comprehension Overall Auditory Comprehension: Appears within functional limits for tasks assessed    Expression Expression Primary Mode of  Expression: Verbal Verbal Expression Overall Verbal Expression: Impaired Initiation: No impairment Automatic Speech: Name;Social Response Level of Generative/Spontaneous Verbalization: Conversation Naming: No impairment Pragmatics: Impairment Impairments: Topic maintenance;Turn Taking Interfering Components: Attention Non-Verbal Means of Communication: Not applicable   Oral / Motor  Motor Speech Overall Motor Speech: Appears within functional limits for tasks assessed            Osie Bond., M.A. [place] Office [phone]  Secure chat preferred  09/01/2022, 12:20 PM

## 2022-09-02 LAB — GLUCOSE, CAPILLARY
Glucose-Capillary: 155 mg/dL — ABNORMAL HIGH (ref 70–99)
Glucose-Capillary: 230 mg/dL — ABNORMAL HIGH (ref 70–99)
Glucose-Capillary: 216 mg/dL — ABNORMAL HIGH (ref 70–99)
Glucose-Capillary: 182 mg/dL — ABNORMAL HIGH (ref 70–99)

## 2022-09-02 LAB — BASIC METABOLIC PANEL
Anion gap: 10 (ref 5–15)
BUN: 16 mg/dL (ref 8–23)
CO2: 25 mmol/L (ref 22–32)
Calcium: 9.3 mg/dL (ref 8.9–10.3)
Chloride: 103 mmol/L (ref 98–111)
Creatinine, Ser: 0.92 mg/dL (ref 0.44–1.00)
GFR, Estimated: >60 mL/min (ref >60)
Glucose, Bld: 132 mg/dL — ABNORMAL HIGH (ref 70–99)
Potassium: 4 mmol/L (ref 3.5–5.1)
Sodium: 138 mmol/L (ref 135–145)

## 2022-09-02 MED ORDER — LIDOCAINE 5 % EX PTCH
1.0000 | MEDICATED_PATCH | CUTANEOUS | Status: DC
  Administered 2022-09-02 – 2022-09-03 (×2): 1 via TRANSDERMAL
  Filled 2022-09-02 (×3): qty 1

## 2022-09-02 NOTE — Care Management Important Message (Signed)
Important Message  Patient Details  Name: Taylor Benitez MRN: [id number] Date of Birth: 1960-11-16   Medicare Important Message Given:  Yes     [name] 09/02/2022, 1:53 PM

## 2022-09-02 NOTE — Plan of Care (Signed)
  Problem: Education: Goal: Ability to describe self-care measures that may prevent or decrease complications (Diabetes Survival Skills Education) will improve Outcome: Progressing Goal: Individualized Educational Video(s) Outcome: Progressing   Problem: Coping: Goal: Ability to adjust to condition or change in health will improve Outcome: Progressing   Problem: Fluid Volume: Goal: Ability to maintain a balanced intake and output will improve Outcome: Progressing   Problem: Health Behavior/Discharge Planning: Goal: Ability to identify and utilize available resources and services will improve Outcome: Progressing Goal: Ability to manage health-related needs will improve Outcome: Progressing   Problem: Metabolic: Goal: Ability to maintain appropriate glucose levels will improve Outcome: Progressing   Problem: Nutritional: Goal: Maintenance of adequate nutrition will improve Outcome: Progressing Goal: Progress toward achieving an optimal weight will improve Outcome: Progressing   Problem: Skin Integrity: Goal: Risk for impaired skin integrity will decrease Outcome: Progressing   Problem: Tissue Perfusion: Goal: Adequacy of tissue perfusion will improve Outcome: Progressing   Problem: Health Behavior/Discharge Planning: Goal: Ability to manage health-related needs will improve Outcome: Progressing   Problem: Clinical Measurements: Goal: Ability to maintain clinical measurements within normal limits will improve Outcome: Progressing Goal: Will remain free from infection Outcome: Progressing Goal: Diagnostic test results will improve Outcome: Progressing Goal: Respiratory complications will improve Outcome: Progressing Goal: Cardiovascular complication will be avoided Outcome: Progressing   Problem: Activity: Goal: Risk for activity intolerance will decrease Outcome: Progressing   Problem: Nutrition: Goal: Adequate nutrition will be maintained Outcome:  Progressing   Problem: Coping: Goal: Level of anxiety will decrease Outcome: Progressing   Problem: Elimination: Goal: Will not experience complications related to bowel motility Outcome: Progressing Goal: Will not experience complications related to urinary retention Outcome: Progressing   Problem: Pain Managment: Goal: General experience of comfort will improve Outcome: Progressing   Problem: Safety: Goal: Ability to remain free from injury will improve Outcome: Progressing   Problem: Skin Integrity: Goal: Risk for impaired skin integrity will decrease Outcome: Progressing   

## 2022-09-02 NOTE — Progress Notes (Signed)
[facility] Progress Note Patient: Taylor Benitez [id number] DOB: [birth year] DOA: 08/29/2022  DOS: the patient was seen and examined on 09/02/2022  Brief hospital course: PMH of chronic pain syndrome on chronic opioids, liver cirrhosis, rheumatoid arthritis, HTN, hypothyroidism, type II DM, cocaine abuse, alcohol abuse as well as marijuana abuse. Frequent presentation with substance abuse to ER or hospitalization.  1/10-alcohol abuse 7/11-encephalopathy due to prescription drug use. 10/5-confusion and encephalopathy with a fall with concern for possible drug and alcohol use. 3/8-this admission presents with possible baclofen overdose and encephalopathy.  There is also concern for left-sided facial droop and left-sided weakness concerning for a possible stroke.  MRI brain negative for acute stroke. Currently mentation improving.  No deficit. Assessment and Plan: Acute toxic encephalopathy Accidental baclofen overdose. Patient presented with confusion episode. She has baclofen prescribed 10 mg twice daily as needed but she reported that she is taking this medication 3-4 times every day. I have called [place] [facility].  Patient filled baclofen on August 26, 2022. Patient did not require any respiratory assistance and mentation has significantly improved. Patient has prior history of substance abuse and frequent hospitalization secondary to prescription medication abuse. At present I feel that the patient is not safe to represcribe baclofen for long-term duration. Will initiate 5 mg daily as needed and taper it off over next 7 days.   Concern for acute stroke. Ruled out. CT of the head unremarkable. MRI brain unremarkable. CT angio head and neck negative for any large vessel occlusion.  Does have a left ICA 75% stenosis and is well as 60% stenosis in the right ICA. No antithrombotic therapy prior to admission.  Currently on aspirin. Hemoglobin A1c 8.0.  Will attempt to  aggressively treated. Lipid panel shows LDL of 70.  Not on any medication.  Will initiate Lipitor 10 mg daily.   Concern for A-fib-ruled out. Reportedly at the time of admission there was some concern for A-fib. Currently patient does not have any A-fib seen on telemetry. On EKG evaluation I do not see any A-fib as well. Would recommend outpatient 30-day monitoring due to her unresponsive event but I do not think that the patient has any A-fib and therefore currently no indication for any anticoagulation. Patient also remains at very high risk for fall, told me that she falls once every week-due to this high risk at present I do not recommend anticoagulation.    Polysubstance abuse. Patient's UDS is positive for cocaine. In the past the patient also had UDS positivity for cannabis. Patient also had reported to drink alcohol on a infrequent basis in the past. Counseled the patient to stop abusing substance like this as it could lead to more falls.   Prescription drug abuse. Patient is prescribed baclofen 1 twice daily as needed but reportedly is taking 3-4 on a daily basis. Patient already established with pain management outpatient.  Mood disorder. Patient is on amitriptyline 200 mg nightly. This is a very high dose which can lead to recurrent fall as well. For now we will continue with due to its chronicity but reduce the dose to 100 mg nightly only.  Reported episodic whole-body spasm. Patient reports that ever since she is a young person she has had episodes of whole body spasm-spells associated with stiffness which will improve on its own. She told me ' they tried everything at [facility] and were only able to find amitriptyline 200 mg to be effective'.  I do not see any neurological evaluation  performed at [facility] she does have a PCP there. Currently no clear etiology of episodes identified.  No episodes here in the hospital seen so far. Outpatient referral to  neurology.  Chronic pain medication use. Patient follows up with pain medication management clinic and is on oxy IR 5 mg which she reports taking 3 times daily as needed. Patient is UDS is actually negative for any opioids despite her claim that she takes this medication 3 times a day. For now we will continue that medication.  But recommend her pain management provider to monitor UDS more frequently.   Type 2 diabetes mellitus, uncontrolled with hyperglycemia, without long-term insulin use, without any complication. On metformin and Jardiance at home. Currently on sliding scale insulin here in the hospital. Will change diet from regular to carb modified.   Hypothyroidism. TSH 0.034.  Free T4 1.22.  Patient is on Synthroid 150 mcg. Does reduce to 125 mcg.   Suspicion for acute cystitis. Patient is currently on Keflex. Will continue for a total 5-day treatment course.  Left shoulder pain. Outpatient follow-up with PCP recommended. Currently lidocaine patch.  Subjective: No nausea no vomiting or no fever no chills.  Reports shoulder pain on the left side.  Reports that she has episodic stiffness for many years for which she is taking amitriptyline.  Physical Exam: General: in Mild distress, No Rash Cardiovascular: S1 and S2 Present, No Murmur Respiratory: Good respiratory effort, Bilateral Air entry present. No Crackles, No wheezes Abdomen: Bowel Sound present, No tenderness Extremities: No edema, range of motion on left shoulder limited due to reported pain. Neuro: Alert and oriented x3, no new focal deficit  Data Reviewed: I have Reviewed nursing notes, Vitals, and Lab results. Since last encounter, pertinent lab results CBG and BMP   . I have ordered test including BMP  .   Disposition: Status is: Inpatient Remains inpatient appropriate because: Awaiting SNF placement.   Family Communication: No one at bedside Level of care: Telemetry Cardiac continue to presentation with  an unresponsive event. Vitals:   09/02/22 0807 09/02/22 1210 09/02/22 1555 09/02/22 2005  BP: (!) 178/75 (!) 152/67 139/67 130/85  Pulse: 74 79 79 83  Resp: '18 18 18 18  '$ Temp: 97.8 F (36.6 C) 97.8 F (36.6 C) 98 F (36.7 C) 98.5 F (36.9 C)  TempSrc: Oral Oral Oral Oral  SpO2: 97% 99% 98% 99%  Weight:      Height:         Author: Berle Mull, MD 09/02/2022 9:08 PM  Please look on [email or web address] to find out who is on call.

## 2022-09-02 NOTE — Progress Notes (Signed)
Physical Therapy Treatment Patient Details Name: Taylor Benitez MRN: [id number] DOB: 08/24/1960 Today's Date: 09/02/2022   History of Present Illness 62 yo female with onset of medication overdose was admitted 3/8 for management of medical needs.  Has UTI, confusion, difficulty with giving history, cocaine use, narcotic use.  PMHx:  cocaine use, DM, HTN, a-fib, hypothyroidism, narcotic use, B THA's, panic attacks, SBO, OA, L great toe osteomyelitis, dentures, diverticulosis, PN    PT Comments    Pt demonstrates improvement in cognition and mobility, and is agreeable to physical therapy session. Trialed gait with cane, as this is the assistive device pt was utilizing PTA. Pt requiring min guard assist for limited hallway ambulation. Gait speed of 1.04 ft/s indicative of high fall ris. Continue to recommend SNF for ongoing Physical Therapy.      Recommendations for follow up therapy are one component of a multi-disciplinary discharge planning process, led by the attending physician.  Recommendations may be updated based on patient status, additional functional criteria and insurance authorization.  Follow Up Recommendations  Skilled nursing-short term rehab (<3 hours/day) Can patient physically be transported by private vehicle: Yes   Assistance Recommended at Discharge Intermittent Supervision/Assistance  Patient can return home with the following A little help with walking and/or transfers;A little help with bathing/dressing/bathroom;Assistance with cooking/housework;Direct supervision/assist for medications management;Direct supervision/assist for financial management;Assist for transportation;Help with stairs or ramp for entrance   Equipment Recommendations  None recommended by PT    Recommendations for Other Services       Precautions / Restrictions Precautions Precautions: Fall Restrictions Weight Bearing Restrictions: No     Mobility  Bed Mobility Overal bed mobility:  Modified Independent                  Transfers Overall transfer level: Needs assistance Equipment used: Straight cane Transfers: Sit to/from Stand Sit to Stand: Supervision                Ambulation/Gait Ambulation/Gait assistance: Min guard Gait Distance (Feet): 250 Feet Assistive device: Straight cane Gait Pattern/deviations: Step-through pattern, Decreased stride length, Decreased dorsiflexion - right, Decreased dorsiflexion - left Gait velocity: 1.04 ft/s Gait velocity interpretation: <1.31 ft/sec, indicative of household ambulator   General Gait Details: shuffling gait pattern, using cane intermittently for balance, min guard assist   Stairs             Wheelchair Mobility    Modified Rankin (Stroke Patients Only)       Balance Overall balance assessment: Needs assistance, History of Falls   Sitting balance-Leahy Scale: Good     Standing balance support: No upper extremity supported, During functional activity Standing balance-Leahy Scale: Fair                              Cognition Arousal/Alertness: Awake/alert Behavior During Therapy: WFL for tasks assessed/performed Overall Cognitive Status: No family/caregiver present to determine baseline cognitive functioning                                 General Comments: Endorses memory issues at baseline        Exercises      General Comments        Pertinent Vitals/Pain Pain Assessment Pain Assessment: Faces Faces Pain Scale: No hurt    Home Living  Prior Function            PT Goals (current goals can now be found in the care plan section) Acute Rehab PT Goals Patient Stated Goal: to go to rehab Potential to Achieve Goals: Good Progress towards PT goals: Progressing toward goals    Frequency    Min 2X/week      PT Plan Current plan remains appropriate    Co-evaluation              AM-PAC PT "6  Clicks" Mobility   Outcome Measure  Help needed turning from your back to your side while in a flat bed without using bedrails?: None Help needed moving from lying on your back to sitting on the side of a flat bed without using bedrails?: A Little Help needed moving to and from a bed to a chair (including a wheelchair)?: A Little Help needed standing up from a chair using your arms (e.g., wheelchair or bedside chair)?: A Little Help needed to walk in hospital room?: A Little Help needed climbing 3-5 steps with a railing? : A Little 6 Click Score: 19    End of Session Equipment Utilized During Treatment: Gait belt Activity Tolerance: Patient tolerated treatment well Patient left: with call bell/phone within reach;in chair;with chair alarm set Nurse Communication: Mobility status PT Visit Diagnosis: Unsteadiness on feet (R26.81);Muscle weakness (generalized) (M62.81);Difficulty in walking, not elsewhere classified (R26.2)     Time: [id number] PT Time Calculation (min) (ACUTE ONLY): 27 min  Charges:  $Therapeutic Activity: 23-37 mins                     Wyona Almas, PT, DPT Acute Rehabilitation Services Office [phone]    Deno Etienne 09/02/2022, 11:53 AM

## 2022-09-03 ENCOUNTER — Inpatient Hospital Stay (HOSPITAL_COMMUNITY): Payer: 59

## 2022-09-03 DIAGNOSIS — M19012 Primary osteoarthritis, left shoulder: Secondary | ICD-10-CM

## 2022-09-03 DIAGNOSIS — I10 Essential (primary) hypertension: Secondary | ICD-10-CM

## 2022-09-03 LAB — VITAMIN B1: Vitamin B1 (Thiamine): 227.4 nmol/L — ABNORMAL HIGH (ref 66.5–200.0)

## 2022-09-03 LAB — BASIC METABOLIC PANEL
Anion gap: 10 (ref 5–15)
BUN: 22 mg/dL (ref 8–23)
CO2: 25 mmol/L (ref 22–32)
Calcium: 9 mg/dL (ref 8.9–10.3)
Chloride: 100 mmol/L (ref 98–111)
Creatinine, Ser: 0.91 mg/dL (ref 0.44–1.00)
GFR, Estimated: >60 mL/min (ref >60)
Glucose, Bld: 184 mg/dL — ABNORMAL HIGH (ref 70–99)
Potassium: 3.9 mmol/L (ref 3.5–5.1)
Sodium: 135 mmol/L (ref 135–145)

## 2022-09-03 LAB — GLUCOSE, CAPILLARY
Glucose-Capillary: 149 mg/dL — ABNORMAL HIGH (ref 70–99)
Glucose-Capillary: 194 mg/dL — ABNORMAL HIGH (ref 70–99)
Glucose-Capillary: 209 mg/dL — ABNORMAL HIGH (ref 70–99)
Glucose-Capillary: 178 mg/dL — ABNORMAL HIGH (ref 70–99)

## 2022-09-03 MED ORDER — BACITRACIN-NEOMYCIN-POLYMYXIN OINTMENT TUBE
TOPICAL_OINTMENT | Freq: Every day | CUTANEOUS | Status: DC
Start: 2022-09-03 — End: 2022-09-04
  Filled 2022-09-03: qty 14

## 2022-09-03 NOTE — Plan of Care (Signed)
  Problem: Education: Goal: Ability to describe self-care measures that may prevent or decrease complications (Diabetes Survival Skills Education) will improve Outcome: Progressing Goal: Individualized Educational Video(s) Outcome: Progressing   Problem: Coping: Goal: Ability to adjust to condition or change in health will improve Outcome: Progressing   Problem: Fluid Volume: Goal: Ability to maintain a balanced intake and output will improve Outcome: Progressing   Problem: Health Behavior/Discharge Planning: Goal: Ability to identify and utilize available resources and services will improve Outcome: Progressing Goal: Ability to manage health-related needs will improve Outcome: Progressing   Problem: Metabolic: Goal: Ability to maintain appropriate glucose levels will improve Outcome: Progressing   Problem: Nutritional: Goal: Maintenance of adequate nutrition will improve Outcome: Progressing Goal: Progress toward achieving an optimal weight will improve Outcome: Progressing   Problem: Skin Integrity: Goal: Risk for impaired skin integrity will decrease Outcome: Progressing   Problem: Tissue Perfusion: Goal: Adequacy of tissue perfusion will improve Outcome: Progressing   Problem: Health Behavior/Discharge Planning: Goal: Ability to manage health-related needs will improve Outcome: Progressing   Problem: Clinical Measurements: Goal: Ability to maintain clinical measurements within normal limits will improve Outcome: Progressing Goal: Will remain free from infection Outcome: Progressing Goal: Diagnostic test results will improve Outcome: Progressing Goal: Respiratory complications will improve Outcome: Progressing Goal: Cardiovascular complication will be avoided Outcome: Progressing   Problem: Activity: Goal: Risk for activity intolerance will decrease Outcome: Progressing   Problem: Nutrition: Goal: Adequate nutrition will be maintained Outcome:  Progressing   Problem: Coping: Goal: Level of anxiety will decrease Outcome: Progressing   Problem: Elimination: Goal: Will not experience complications related to bowel motility Outcome: Progressing Goal: Will not experience complications related to urinary retention Outcome: Progressing   Problem: Pain Managment: Goal: General experience of comfort will improve Outcome: Progressing   Problem: Safety: Goal: Ability to remain free from injury will improve Outcome: Progressing   Problem: Skin Integrity: Goal: Risk for impaired skin integrity will decrease Outcome: Progressing   

## 2022-09-03 NOTE — TOC Progression Note (Signed)
Transition of Care [facility]) - Progression Note    Patient Details  Name: Taylor Benitez MRN: [id number] Date of Birth: [birth year]  Transition of Care [facility]) CM/SW [place], [place] Phone Number: 09/03/2022, 3:19 PM  Clinical Narrative:   Patient received bed offer at [facility]. CSW spoke with [facility] and confirmed bed availability. CSW met with patient to discuss SNF offer, and patient asked for information on the SNF. CSW printed out some information and provided to patient, she is agreeable. CSW requested initiation of insurance authorization.   CSW also discussed with patient cocaine use, and patient said she doesn't have a problem with it, only did it the one time. Patient not interested in resources.     Expected Discharge Plan: Skilled Nursing Facility Barriers to Discharge: Ship broker, Continued Medical Work up, Active Substance Use - Placement  Expected Discharge Plan and Services     Post Acute Care Choice: [place] Living arrangements for the past 2 months: Apartment                                       Social Determinants of Health (SDOH) Interventions SDOH Screenings   Food Insecurity: Food Insecurity Present (08/30/2022)  Housing: High Risk (08/30/2022)  Transportation Needs: Unmet Transportation Needs (09/01/2022)  Utilities: Not At Risk (08/30/2022)  Depression (PHQ2-9): Low Risk  (03/28/2019)  Tobacco Use: Medium Risk (08/30/2022)    Readmission Risk Interventions     No data to display

## 2022-09-03 NOTE — Progress Notes (Signed)
Mobility Specialist: Progress Note   09/03/22 1617  Mobility  Activity Ambulated with assistance in hallway  Level of Assistance Minimal assist, patient does 75% or more  Assistive Device Cane  Distance Ambulated (ft) 250 ft  Activity Response Tolerated well  Mobility Referral Yes  $Mobility charge 1 Mobility   Post-Mobility: 89 HR  Pt received in the bed and agreeable to mobility. MinA with bed mobility as well as to stand. No c/o throughout. Pt with short shuffle steps d/t fear of falling. Pt to the chair after session with call bell and phone in reach. Chair alarm is on.   Martin Bettyanne Dittman Mobility Specialist Please contact via SecureChat or Rehab office at [phone]

## 2022-09-03 NOTE — Progress Notes (Signed)
[facility] Progress Note Patient: Taylor Benitez [id number] DOB: Feb 02, 1961 DOA: 08/29/2022  DOS: the patient was seen and examined on 09/03/2022  Brief hospital course: PMH of chronic pain syndrome on chronic opioids, liver cirrhosis, rheumatoid arthritis, HTN, hypothyroidism, type II DM, cocaine abuse, alcohol abuse as well as marijuana abuse. Frequent presentation with substance abuse to ER or hospitalization.  1/10-alcohol abuse 7/11-encephalopathy due to prescription drug use. 10/5-confusion and encephalopathy with a fall with concern for possible drug and alcohol use. 3/8-this admission presents with possible baclofen overdose and encephalopathy.  There is also concern for left-sided facial droop and left-sided weakness concerning for a possible stroke.  MRI brain negative for acute stroke. Currently mentation improving.  No deficit. Assessment and Plan: Acute toxic encephalopathy Accidental baclofen overdose. Patient presented with confusion episode. She has baclofen prescribed 10 mg twice daily as needed but she reported that she is taking this medication 3-4 times every day. I have called [place] at [place].  Patient filled baclofen on August 26, 2022. Patient did not require any respiratory assistance and mentation has significantly improved. Patient has prior history of substance abuse and frequent hospitalization secondary to prescription medication abuse. At present I feel that the patient is not safe to represcribe baclofen for long-term duration. Will initiate 5 mg daily as needed and taper it off over next 7 days.   Concern for acute stroke. Ruled out. CT of the head unremarkable. MRI brain unremarkable. CT angio head and neck negative for any large vessel occlusion.  Does have a left ICA 75% stenosis and is well as 60% stenosis in the right ICA. No antithrombotic therapy prior to admission.  Currently on aspirin. Hemoglobin A1c 8.0.  Will attempt to  aggressively treated. Lipid panel shows LDL of 70.  Not on any medication.  Lipitor 10 mg started   Concern for A-fib-ruled out. Reportedly at the time of admission there was some concern for A-fib. Currently patient does not have any A-fib seen on telemetry. On EKG evaluation I do not see any A-fib as well. Would recommend outpatient 30-day monitoring due to her unresponsive event but I do not think that the patient has any A-fib and therefore currently no indication for any anticoagulation. Patient also remains at very high risk for fall, told me that she falls once every week-due to this high risk at present I do not recommend anticoagulation.    Polysubstance abuse. Patient's UDS is positive for cocaine. In the past the patient also had UDS positivity for cannabis. Patient also had reported to drink alcohol on a infrequent basis in the past. Counseled the patient to stop abusing substance like this as it could lead to more falls.   Prescription drug abuse. Patient is prescribed baclofen 1 twice daily as needed but reportedly is taking 3-4 on a daily basis. Patient already established with pain management outpatient.  Mood disorder. Patient is on amitriptyline 200 mg nightly. This is a very high dose which can lead to recurrent fall as well. For now we will continue with due to its chronicity but reduce the dose to 100 mg nightly only.  Reported episodic whole-body spasm. Patient reports that ever since she is a young person she has had episodes of whole body spasm-spells associated with stiffness which will improve on its own. She told me ' they tried everything at [facility] and were only able to find amitriptyline 200 mg to be effective'.  I do not see any neurological evaluation performed at  [facility] she does have a PCP there. Currently no clear etiology of episodes identified.  No episodes here in the hospital seen so far. Outpatient referral to neurology.  Chronic pain  medication use. Patient follows up with pain medication management clinic and is on oxy IR 5 mg which she reports taking 3 times daily as needed. Patient is UDS is actually negative for any opioids despite her claim that she takes this medication 3 times a day. For now we will continue that medication.  But recommend her pain management provider to monitor UDS more frequently.   Type 2 diabetes mellitus, uncontrolled with hyperglycemia, without long-term insulin use, without any complication. On metformin and Jardiance at home. Currently on sliding scale insulin here in the hospital. Will change diet from regular to carb modified.   Hypothyroidism. TSH 0.034.  Free T4 1.22.  Patient is on Synthroid 150 mcg. Does reduce to 125 mcg.   Suspicion for acute cystitis. Patient is currently on Keflex. Will continue for a total 5-day treatment course.  Left shoulder pain. X-ray notes severe glenohumeral osteoarthritis.  Also looks to have bone fragment. Currently lidocaine patch.  Subjective: Continues to complain of left shoulder pain  Physical Exam: General: Alert and oriented x 3, no acute distress Cardiovascular: Regular rate and rhythm, S1-S2 Respiratory: Clear auscultation bilaterally Abdomen: soft, nontender, nondistended, normal active bowel sounds Extremities: No clubbing or cyanosis or edema, limited range of motion of left shoulder Neuro: No focal deficits  Data Reviewed: Basic metabolic panel for [id number] unremarkable  Disposition: Status is: Inpatient Remains inpatient appropriate because: Awaiting SNF placement.   Family Communication: Will attempt to call family Level of care: Telemetry Cardiac continue to presentation with an unresponsive event. Vitals:   09/02/22 2356 09/03/22 0334 09/03/22 0848 09/03/22 1128  BP: 130/68 123/73 (!) 139/92 108/64  Pulse: 78 75 74 74  Resp: '18 18 18 18  '$ Temp: 98.5 F (36.9 C) 98.6 F (37 C) 97.7 F (36.5 C) 97.8 F (36.6 C)   TempSrc:   Oral Oral  SpO2: 97% 97% 97% 97%  Weight:      Height:         Author: Annita Brod, MD 09/03/2022 2:19 PM  Please look on [email or web address] to find out who is on call.

## 2022-09-03 NOTE — Plan of Care (Signed)
  Problem: Education: Goal: Ability to describe self-care measures that may prevent or decrease complications (Diabetes Survival Skills Education) will improve Outcome: Progressing   Problem: Coping: Goal: Ability to adjust to condition or change in health will improve Outcome: Progressing   Problem: Fluid Volume: Goal: Ability to maintain a balanced intake and output will improve Outcome: Progressing   Problem: Health Behavior/Discharge Planning: Goal: Ability to manage health-related needs will improve Outcome: Progressing   Problem: Skin Integrity: Goal: Risk for impaired skin integrity will decrease Outcome: Progressing   

## 2022-09-03 NOTE — Inpatient Diabetes Management (Signed)
Inpatient Diabetes Program Recommendations  AACE/ADA: New Consensus Statement on Inpatient Glycemic Control (2015)  Target Ranges:  Prepandial:   less than 140 mg/dL      Peak postprandial:   less than 180 mg/dL (1-2 hours)      Critically ill patients:  140 - 180 mg/dL   Lab Results  Component Value Date   GLUCAP 149 (H) 09/03/2022   HGBA1C 8.0 (H) 08/29/2022    Review of Glycemic Control  Latest Reference Range & Units 09/02/22 06:16 09/02/22 12:09 09/02/22 16:51 09/02/22 21:24 09/03/22 06:03  Glucose-Capillary 70 - 99 mg/dL 155 (H) 230 (H) 216 (H) 182 (H) 149 (H)  (H): Data is abnormally high  Diabetes history: DM  Outpatient Diabetes medications:  Jardiance 25 mg daily, Metformin 1000 mg bid Current orders for Inpatient glycemic control:  Novolog 0-15 units tid with meals and HS   Inpatient Diabetes Program Recommendations:   Novolog meal coverage 3 units tid with meals (hold if patient eats less than 50% or NPO).   Will continue to follow while inpatient.  Thank you, Reche Dixon, MSN, [place] Diabetes Coordinator Inpatient Diabetes Program [phone] (team pager from 8a-5p)

## 2022-09-04 LAB — GLUCOSE, CAPILLARY
Glucose-Capillary: 150 mg/dL — ABNORMAL HIGH (ref 70–99)
Glucose-Capillary: 196 mg/dL — ABNORMAL HIGH (ref 70–99)

## 2022-09-04 LAB — CULTURE, BLOOD (ROUTINE X 2)
Culture: NO GROWTH
Culture: NO GROWTH
Special Requests: ADEQUATE

## 2022-09-04 MED ORDER — FOLIC ACID 1 MG PO TABS: 1.0000 mg | ORAL_TABLET | Freq: Every day | ORAL | 2 refills | Status: DC

## 2022-09-04 MED ORDER — OXYCODONE HCL 5 MG PO TABS: 5.0000 mg | ORAL_TABLET | Freq: Three times a day (TID) | ORAL | 0 refills | Status: DC | PRN

## 2022-09-04 MED ORDER — LEVOTHYROXINE SODIUM 125 MCG PO TABS: 125.0000 ug | ORAL_TABLET | Freq: Every day | ORAL | 2 refills | Status: DC

## 2022-09-04 MED ORDER — ATORVASTATIN CALCIUM 10 MG PO TABS: 10.0000 mg | ORAL_TABLET | Freq: Every day | ORAL | 11 refills | Status: AC

## 2022-09-04 MED ORDER — AMITRIPTYLINE HCL 100 MG PO TABS: 100.0000 mg | ORAL_TABLET | Freq: Every day | ORAL | 0 refills | Status: DC

## 2022-09-04 MED ORDER — LIDOCAINE 5 % EX PTCH: 1.0000 | MEDICATED_PATCH | CUTANEOUS | 0 refills | Status: AC

## 2022-09-04 MED ORDER — ACETAMINOPHEN 325 MG PO TABS: 650.0000 mg | ORAL_TABLET | ORAL | Status: AC | PRN

## 2022-09-04 NOTE — TOC Transition Note (Signed)
Transition of Care [facility]) - CM/SW Discharge Note   Patient Details  Name: Taylor Benitez MRN: [id number] Date of Birth: [birth year]  Transition of Care [facility]) CM/SW Contact:  Geralynn Ochs, LCSW Phone Number: 09/04/2022, 11:53 AM   Clinical Narrative:   CSW received insurance authorization for patient to admit to SNF. CSW confirmed bed availability with [facility], and sent discharge information. CSW updated patient, she does not have anyone to assist with transportation as she cannot even get any family to bring her any of her clothes. Transport arranged with [facility] for next available.  Nurse to call report to [phone], Room C12-2.    Final next level of care: Skilled Nursing Facility Barriers to Discharge: Barriers Resolved   Patient Goals and CMS Choice CMS Medicare.gov Compare Post Acute Care list provided to:: Patient Choice offered to / list presented to : Patient  Discharge Placement                Patient chooses bed at:  [facility]) Patient to be transferred to facility by: [place] Name of family member notified: Self Patient and family notified of of transfer: 09/04/22  Discharge Plan and Services Additional resources added to the After Visit Summary for       Post Acute Care Choice: [place]                               Social Determinants of Health (SDOH) Interventions SDOH Screenings   Food Insecurity: Food Insecurity Present (08/30/2022)  Housing: High Risk (08/30/2022)  Transportation Needs: Unmet Transportation Needs (09/01/2022)  Utilities: Not At Risk (08/30/2022)  Depression (PHQ2-9): Low Risk  (03/28/2019)  Tobacco Use: Medium Risk (08/30/2022)     Readmission Risk Interventions     No data to display

## 2022-09-04 NOTE — Progress Notes (Signed)
Physical Therapy Treatment Patient Details Name: Taylor Benitez MRN: [id number] DOB: 24-Mar-1961 Today's Date: 09/04/2022   History of Present Illness 62 yo female with onset of medication overdose was admitted 3/8 for management of medical needs.  Has UTI, confusion, difficulty with giving history, cocaine use, narcotic use.  PMHx:  cocaine use, DM, HTN, a-fib, hypothyroidism, narcotic use, B THA's, panic attacks, SBO, OA, L great toe osteomyelitis, dentures, diverticulosis, PN    PT Comments    Pt received in bed. Was unable to come to EOB to L due to L shoulder pain. Achieved coming to R with min A and use of rail. Pt began ambulation without AD and needed consistent min A due to instability. With cane pt able to ambulate on smooth, level surface with close supervision. Worked on standing balance exercises in room with UE support. PT will continue to follow.    Recommendations for follow up therapy are one component of a multi-disciplinary discharge planning process, led by the attending physician.  Recommendations may be updated based on patient status, additional functional criteria and insurance authorization.  Follow Up Recommendations  Skilled nursing-short term rehab (<3 hours/day) Can patient physically be transported by private vehicle: Yes   Assistance Recommended at Discharge Intermittent Supervision/Assistance  Patient can return home with the following A little help with walking and/or transfers;A little help with bathing/dressing/bathroom;Assistance with cooking/housework;Direct supervision/assist for medications management;Direct supervision/assist for financial management;Assist for transportation;Help with stairs or ramp for entrance   Equipment Recommendations  None recommended by PT    Recommendations for Other Services       Precautions / Restrictions Precautions Precautions: Fall Precaution Comments: motor planning confusion Restrictions Weight Bearing  Restrictions: No     Mobility  Bed Mobility Overal bed mobility: Needs Assistance Bed Mobility: Supine to Sit     Supine to sit: Min assist     General bed mobility comments: pt attempted to come to L side of bed but was unable to roll L due to L shoulder discomfort. Pt cued to try going to R. She was able to tolerate this better but had difficulty using R rail with L hand. Min A needed to come to full sitting    Transfers Overall transfer level: Needs assistance Equipment used: Straight cane, None Transfers: Sit to/from Stand Sit to Stand: Supervision           General transfer comment: close supervision for sit>stand, no LOB    Ambulation/Gait Ambulation/Gait assistance: Supervision, Min assist Gait Distance (Feet): 300 Feet Assistive device: Straight cane, None Gait Pattern/deviations: Step-through pattern, Decreased stride length, Decreased dorsiflexion - right, Decreased dorsiflexion - left Gait velocity: decreased Gait velocity interpretation: <1.31 ft/sec, indicative of household ambulator   General Gait Details: first 43' without AD and pt needed min A for safety. Second 200' with SPC and pt much more steady and able to ambulate with supervision   Stairs Stairs: Yes Stairs assistance: Mod assist, Supervision Stair Management: No rails, One rail Right, Step to pattern, Forwards Number of Stairs: 4 General stair comments: without rail (which pt does have to do at home) pt needed mod A to ascend and descend 2 stairs safely, even with use of cane. With one rail, she was able to accomplish this safely with supervision. Discussed avoiding the one step at her apt complex without a rail but she reports when she enters the other way there are acorns and gumballs all over the ground which is also a hazard   Wheelchair  Mobility    Modified Rankin (Stroke Patients Only)       Balance Overall balance assessment: Needs assistance, History of Falls Sitting-balance  support: Feet supported Sitting balance-Leahy Scale: Good     Standing balance support: No upper extremity supported, During functional activity Standing balance-Leahy Scale: Fair Standing balance comment: requires support for dynamics               High Level Balance Comments: worked on [employer] stance with unilateral support and eyes closed as well as [place] with eyes open and closed            Cognition Arousal/Alertness: Awake/alert Behavior During Therapy: WFL for tasks assessed/performed Overall Cognitive Status: No family/caregiver present to determine baseline cognitive functioning                                 General Comments: Endorses memory issues at baseline        Exercises      General Comments        Pertinent Vitals/Pain Pain Assessment Pain Assessment: Faces Faces Pain Scale: Hurts little more Pain Location: B shoulders, L>R Pain Descriptors / Indicators: Discomfort Pain Intervention(s): Limited activity within patient's tolerance, Monitored during session    Home Living                          Prior Function            PT Goals (current goals can now be found in the care plan section) Acute Rehab PT Goals Patient Stated Goal: to go to rehab PT Goal Formulation: With patient Time For Goal Achievement: 09/13/22 Potential to Achieve Goals: Good Progress towards PT goals: Progressing toward goals    Frequency    Min 2X/week      PT Plan Current plan remains appropriate    Co-evaluation              AM-PAC PT "6 Clicks" Mobility   Outcome Measure  Help needed turning from your back to your side while in a flat bed without using bedrails?: None Help needed moving from lying on your back to sitting on the side of a flat bed without using bedrails?: A Little Help needed moving to and from a bed to a chair (including a wheelchair)?: A Little Help needed standing up from a chair using your arms (e.g.,  wheelchair or bedside chair)?: A Little Help needed to walk in hospital room?: A Little Help needed climbing 3-5 steps with a railing? : A Lot 6 Click Score: 18    End of Session Equipment Utilized During Treatment: Gait belt Activity Tolerance: Patient tolerated treatment well Patient left: with call bell/phone within reach;in chair;with chair alarm set Nurse Communication: Mobility status PT Visit Diagnosis: Unsteadiness on feet (R26.81);Muscle weakness (generalized) (M62.81);Difficulty in walking, not elsewhere classified (R26.2)     Time: [id number] PT Time Calculation (min) (ACUTE ONLY): 29 min  Charges:  $Gait Training: 8-22 mins $Therapeutic Exercise: 8-22 mins                     Leighton Roach, PT  Acute Rehab Services Secure chat preferred Office [place] 09/04/2022, 1:45 PM

## 2022-09-04 NOTE — Discharge Summary (Signed)
Physician Discharge Summary   Patient: Taylor Benitez MRN: [id number] DOB: 1961-05-06  Admit date:     08/29/2022  Discharge date: 09/04/22  Discharge Physician: Annita Brod   PCP: Abner Greenspan, MD   Recommendations at discharge:   New medication: Tylenol 650 p.o. every 4 hours as needed for pain, fever, headache Medication change: Elavil decreased from 200 mg to 100 mg p.o. nightly Medication change: Baclofen discontinued Medication change: Formally stopped medications the patient had not been taking including her Celebrex, Keflex, vitamin D3, hydroxyzine, multivitamin New medication: Folate 1 mg p.o. daily Medication change: Synthroid decreased from 150 to 125 mcg New medication: Lidoderm 5% orally patch on for 12 hours, off for 12 hours Patient should have repeat thyroid studies done in 3 months.  Discharge Diagnoses: Principal Problem:   Acute metabolic encephalopathy Active Problems:   Type 2 diabetes mellitus with hyperglycemia (HCC)   H/O drug overdose   Essential hypertension   Chronic narcotic use   Anxiety attack   Acute cystitis   Cocaine use   Osteoarthritis of left shoulder  Resolved Problems:   * No resolved hospital problems. [facility] Course: PMH of chronic pain syndrome on chronic opioids, liver cirrhosis, rheumatoid arthritis, HTN, hypothyroidism, type II DM, cocaine abuse, alcohol abuse as well as marijuana abuse. Frequent presentation with substance abuse to ER or hospitalization.  1/10-alcohol abuse 7/11-encephalopathy due to prescription drug use. 10/5-confusion and encephalopathy with a fall with concern for possible drug and alcohol use. 3/8-this admission presents with possible baclofen overdose and encephalopathy.  There is also concern for left-sided facial droop and left-sided weakness concerning for a possible stroke.  MRI brain negative for acute stroke. Currently mentation improving.  No deficit.  Assessment and Plan: Acute toxic  encephalopathy Accidental baclofen overdose. Patient presented with confusion episode. She has baclofen prescribed 10 mg twice daily as needed but she reported that she is taking this medication 3-4 times every day. I have called [place] [facility].  Patient filled baclofen on August 26, 2022. Patient did not require any respiratory assistance and mentation has significantly improved. Patient has prior history of substance abuse and frequent hospitalization secondary to prescription medication abuse. At present I feel that the patient is not safe to represcribe baclofen for long-term duration. Tapered baclofen during hospitalization and then discontinued it altogether   Concern for acute stroke. Ruled out. CT of the head unremarkable. MRI brain unremarkable. CT angio head and neck negative for any large vessel occlusion.  Does have a left ICA 75% stenosis and is well as 60% stenosis in the right ICA. No antithrombotic therapy prior to admission.  Currently on aspirin. Hemoglobin A1c 8.0.  Will attempt to aggressively treated. Lipid panel shows LDL of 70.  Not on any medication.  Lipitor 10 mg started   Concern for A-fib-ruled out. Reportedly at the time of admission there was some concern for A-fib. Currently patient does not have any A-fib seen on telemetry. On EKG evaluation I do not see any A-fib as well. Would recommend outpatient 30-day monitoring due to her unresponsive event but I do not think that the patient has any A-fib and therefore currently no indication for any anticoagulation. Patient also remains at very high risk for fall, told me that she falls once every week-due to this high risk at present I do not recommend anticoagulation.    Polysubstance abuse. Patient's UDS is positive for cocaine. In the past the patient also had UDS positivity  for cannabis. Patient also had reported to drink alcohol on a infrequent basis in the past. Counseled the patient to stop  abusing substance like this as it could lead to more falls.   Prescription drug abuse. Patient is prescribed baclofen 1 twice daily as needed but reportedly is taking 3-4 on a daily basis.  Have formally discontinued this medication Patient already established with pain management outpatient.   Mood disorder. Patient is on amitriptyline 200 mg nightly. This is a very high dose which can lead to recurrent fall as well. For now we will continue with due to its chronicity but reduce the dose to 100 mg nightly only.   Reported episodic whole-body spasm. Patient reports that ever since she is a young person she has had episodes of whole body spasm-spells associated with stiffness which will improve on its own. She told me ' they tried everything at [facility] and were only able to find amitriptyline 200 mg to be effective'.  I do not see any neurological evaluation performed at [facility] she does have a PCP there. Currently no clear etiology of episodes identified.  No episodes here in the hospital seen so far. Outpatient referral to neurology.   Chronic pain medication use. Patient follows up with pain medication management clinic and is on oxy IR 5 mg which she reports taking 3 times daily as needed. Patient is UDS is actually negative for any opioids despite her claim that she takes this medication 3 times a day. For now we will continue that medication.  But recommend her pain management provider to monitor UDS more frequently.   Type 2 diabetes mellitus, uncontrolled with hyperglycemia, without long-term insulin use, without any complication. On metformin and Jardiance at home. Currently on sliding scale insulin here in the hospital. Will change diet from regular to carb modified.   Hypothyroidism. TSH 0.034.  Free T4 1.22.  Patient is on Synthroid 150 mcg. Does reduce to 125 mcg.  Recheck levels in 3 months   Suspicion for acute cystitis. Patient is currently on Keflex. Completed  total 5-day treatment course.   Left shoulder pain. X-ray notes severe glenohumeral osteoarthritis.  Also looks to have bone fragment. Currently lidocaine patch.       Pain control - [employer] Controlled Substance Reporting System database was reviewed. and patient was instructed, not to drive, operate heavy machinery, perform activities at heights, swimming or participation in water activities or provide baby-sitting services while on Pain, Sleep and Anxiety Medications; until their outpatient Physician has advised to do so again. Also recommended to not to take more than prescribed Pain, Sleep and Anxiety Medications.  Consultants: None Procedures performed: None Disposition: Skilled nursing Diet recommendation:  Discharge Diet Orders (From admission, onward)     Start     Ordered   09/04/22 0000  Diet - low sodium heart healthy        09/04/22 1019           Heart healthy DISCHARGE MEDICATION: Allergies as of 09/04/2022       Reactions   Glipizide    GI side eff   Metformin And Related    GI side effects    Penicillins Rash   Tolerated Cephalosporin Date: 11/04/19.        Medication List     STOP taking these medications    celecoxib 100 MG capsule Commonly known as: CELEBREX   cephALEXin 500 MG capsule Commonly known as: KEFLEX   hydrOXYzine 25 MG  tablet Commonly known as: ATARAX   MULTIVITAMIN WOMEN PO   Vitamin D3 1.25 MG (50000 UT) Tabs Generic drug: Cholecalciferol       TAKE these medications    acetaminophen 325 MG tablet Commonly known as: TYLENOL Take 2 tablets (650 mg total) by mouth every 4 (four) hours as needed for mild pain (or temp > 37.5 C (99.5 F)).   amitriptyline 100 MG tablet Commonly known as: ELAVIL Take 1 tablet (100 mg total) by mouth at bedtime. What changed: how much to take   atorvastatin 10 MG tablet Commonly known as: Lipitor Take 1 tablet (10 mg total) by mouth daily.   empagliflozin 25 MG Tabs  tablet Commonly known as: JARDIANCE Take 25 mg by mouth daily.   escitalopram 20 MG tablet Commonly known as: LEXAPRO Take 20 mg by mouth daily.   folic acid 1 MG tablet Commonly known as: FOLVITE Take 1 tablet (1 mg total) by mouth daily. Start taking on: September 05, 2022   levothyroxine 125 MCG tablet Commonly known as: SYNTHROID Take 1 tablet (125 mcg total) by mouth daily at 6 (six) AM. Start taking on: September 05, 2022 What changed:  medication strength how much to take when to take this   lidocaine 5 % Commonly known as: LIDODERM Place 1 patch onto the skin daily. Remove & Discard patch within 12 hours or as directed by MD   metFORMIN 1000 MG (MOD) 24 hr tablet Commonly known as: GLUMETZA Take 1,000 mg by mouth 2 (two) times daily with a meal.   oxyCODONE 5 MG immediate release tablet Commonly known as: Oxy IR/ROXICODONE Take 1 tablet (5 mg total) by mouth 3 (three) times daily as needed for severe pain.   OZEMPIC (0.25 OR 0.5 MG/DOSE) [place] Inject 0.25 mg into the skin once a week. Mondays        Discharge Exam: Filed Weights   08/29/22 2236  Weight: 63.5 kg   General: Alert and oriented x 3, no acute distress Cardiovascular: Regular rate and rhythm, S1-S2  Condition at discharge: good  The results of significant diagnostics from this hospitalization (including imaging, microbiology, ancillary and laboratory) are listed below for reference.   Imaging Studies: DG Shoulder Left  Result Date: 09/03/2022 CLINICAL DATA:  Left shoulder pain. EXAM: LEFT SHOULDER - 2+ VIEW COMPARISON:  None Available. FINDINGS: There is diffuse decreased bone mineralization. Severe glenohumeral joint space narrowing with bone-on-bone contact, subchondral sclerosis, and mild peripheral degenerative osteophytes, and mild medial humeral head and glenoid fossa cortical flattening/remodeling. There is a somewhat crescentic bone density bordering the posteroinferior aspect of the humeral  head on transscapular Y-view, measuring up to 2.2 x 0.6 cm and possibly representing a loose body. Mild inferior acromioclavicular joint space narrowing and peripheral osteophytosis. No acute fracture or dislocation. High-grade atherosclerotic calcifications in the region of the left carotid bulb. IMPRESSION: 1. Severe glenohumeral osteoarthritis. 2. Mild acromioclavicular osteoarthritis. 3. Possible loose body within the posterior glenohumeral joint bordering the posteroinferior aspect of the humeral head. Electronically Signed   By: Yvonne Kendall M.D.   On: 09/03/2022 12:58   CT ANGIO HEAD NECK W WO CM  Result Date: 08/30/2022 CLINICAL DATA:  62 year old female presenting with neurologic deficit yesterday. Negative brain MRI this morning. EXAM: CT ANGIOGRAPHY HEAD AND NECK TECHNIQUE: Multidetector CT imaging of the head and neck was performed using the standard protocol during bolus administration of intravenous contrast. Multiplanar CT image reconstructions and MIPs were obtained to evaluate the vascular anatomy. Carotid  stenosis measurements (when applicable) are obtained utilizing NASCET criteria, using the distal internal carotid diameter as the denominator. RADIATION DOSE REDUCTION: This exam was performed according to the departmental dose-optimization program which includes automated exposure control, adjustment of the mA and/or kV according to patient size and/or use of iterative reconstruction technique. CONTRAST:  8m OMNIPAQUE IOHEXOL 350 MG/ML SOLN COMPARISON:  Brain MRI 0158 hours today.  Head CT yesterday. FINDINGS: CT HEAD Brain: Stable non contrast CT appearance of the brain. Calvarium and skull base: Chronic left orbital floor fracture. Stable visualized osseous structures. Paranasal sinuses: Chronic left orbital floor fracture. Visualized paranasal sinuses and mastoids are stable and well aerated. Orbits: Stable orbit and scalp soft tissues. CTA NECK Skeleton: Multilevel cervical spine  ankylosis with superimposed advanced C3-C4 and C4-C5 degeneration. C5-C6 and C6-C7 ankylosis might be postoperative in nature. There is also bilateral posterior element ankylosis through the cervicothoracic junction and involving most but not all of the visible upper thoracic levels. No acute osseous abnormality identified. Upper chest: Negative. Other neck: Negative. Aortic arch: Incompletely visible 3 vessel arch with arch atherosclerosis. Right carotid system: Partially visible brachiocephalic artery plaque without stenosis. Minimal plaque at the right CCA origin. Soft and calcified plaque along the anterior vessel before the carotid bifurcation. No stenosis before the bifurcation. Additional bulky calcified plaque at the right ICA origin and bulb. Up to 60 % stenosis with respect to the distal vessel occurs at the distal bulb (series 12, image 187). Left carotid system: Plaque at the visible left CCA origin without stenosis. Mild additional left CCA plaque before the bifurcation. Bulky calcified plaque at the left ICA origin and bulb with up to 75 % stenosis with respect to the distal vessel at the distal bulb on series 12, image 187. The vessel remains patent to the skull base. Vertebral arteries: Mild proximal right subclavian artery plaque without stenosis. Patent right vertebral artery origin but with calcified plaque. No significant stenosis (series 14, image 61). Right vertebral artery than normal to the skull base. Mild to moderate proximal left subclavian artery soft and calcified plaque without stenosis. Normal left vertebral artery origin. Fairly codominant left vertebral artery is patent to the skull base with only minor plaque. CTA HEAD Posterior circulation: Patent distal vertebral arteries and vertebrobasilar junction with no significant plaque or stenosis. Patent PICA origins. Patent basilar artery without stenosis. Patent SCA and PCA origins. Posterior communicating arteries are diminutive or  absent. Bilateral PCA branches are within normal limits. Anterior circulation: Both ICA siphons are patent. Moderate left siphon cavernous segment calcified plaque with only mild stenosis. Similar right cavernous calcified plaque with mild to moderate stenosis (series 13, image 72). Patent carotid termini, MCA and ACA origins. Normal anterior communicating artery. Bilateral ACA branches are within normal limits. Both MCA M1 segments and MCA bi/trifurcations are patent without stenosis. Bilateral MCA branches are within normal limits. Venous sinuses: Patent. Anatomic variants: None significant. Review of the MIP images confirms the above findings IMPRESSION: 1. Negative for large vessel occlusion. 2. Positive for bulky calcified plaque at both ICA origins and bulbs. - up to 75% stenosis of the Left ICA distal bulb. - 60% stenosis of the Right ICA distal bulb. - up to Moderate stenosis of the cavernous Right ICA siphon. 3. No significant posterior circulation stenosis. 4. Stable CT appearance of the brain. 5. 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 6.  Aortic Atherosclerosis (ICD10-I70.0). Electronically Signed   By: HLemmie Evens  [place] M.D.   On: 08/30/2022 10:13   EEG adult  Result Date: 08/30/2022 Lora Havens, MD     08/30/2022  2:00 PM Patient Name: Taylor Benitez MRN: [id number] Epilepsy Attending: Lora Havens Referring Physician/Provider: Elodia Florence., MD Date: 08/29/2025 Duration: 28.40 mins Patient history: 62yo F with ams. EEG to evaluate for seizure Level of alertness: Awake AEDs during EEG study: None Technical aspects: This EEG study was done with scalp electrodes positioned according to the 10-20 International system of electrode placement. Electrical activity was reviewed with band pass filter of 1-'70Hz'$ , sensitivity of 7 uV/mm, display speed of 2m/sec with a '60Hz'$  notched filter applied as appropriate. EEG data were  recorded continuously and digitally stored.  Video monitoring was available and reviewed as appropriate. Description:  posterior dominant rhythm consists of 6 Hz activity of moderate voltage (25-35 uV) seen predominantly in posterior head regions, symmetric and reactive to eye opening and eye closing. EEG showed continuous generalized 5 to 7 Hz theta slowing. Physiologic photic driving was not seen during photic stimulation. Hyperventilation was not performed.   ABNORMALITY - Continuous slow, generalized IMPRESSION: This study is suggestive of moderate diffuse encephalopathy, nonspecific etiology. No seizures or epileptiform discharges were seen throughout the recording. PLora Havens  MR BRAIN WO CONTRAST  Result Date: 08/30/2022 CLINICAL DATA:  Stroke suspected EXAM: MRI HEAD WITHOUT CONTRAST TECHNIQUE: Multiplanar, multiecho pulse sequences of the brain and surrounding structures were obtained without intravenous contrast. COMPARISON:  No prior MRI available, correlation is made with CT head 08/29/2022 FINDINGS: Brain: No restricted diffusion to suggest acute or subacute infarct. No acute hemorrhage, mass, mass effect, or midline shift. No hydrocephalus or extra-axial collection. Normal craniocervical junction. Focus of susceptibility in the left frontal lobe (series 12, image 37), which may represent the sequela of a prior microhemorrhage. Scattered T2 hyperintense signal in the periventricular white matter, likely the sequela of mild chronic small vessel ischemic disease. Vascular: Normal arterial flow voids. Skull and upper cervical spine: Normal marrow signal. Sinuses/Orbits: Small mucous retention cyst in the anterior left ethmoid air cells. Otherwise clear paranasal sinuses. No acute finding in the orbits. Other: Trace fluid in right mastoid air cells. IMPRESSION: No acute intracranial process. No evidence of acute or subacute infarct. Electronically Signed   By: AMerilyn BabaM.D.   On: 08/30/2022  02:17   DG Abdomen 1 View  Result Date: 08/30/2022 CLINICAL DATA:  Screening for MRI.  Possible overdose on baclofen. EXAM: ABDOMEN - 1 VIEW COMPARISON:  None Available. FINDINGS: The bowel gas pattern is normal. No radio-opaque calculi or other significant radiographic abnormality are seen. Overlying telemetry leads. No radiopaque foreign body. Bilateral THAs. IMPRESSION: 1. No radiopaque foreign body which would be a contraindication to MRI. Electronically Signed   By: TPlacido SouM.D.   On: 08/30/2022 01:14   CT HEAD WO CONTRAST  Result Date: 08/29/2022 CLINICAL DATA:  Neuro deficit. EXAM: CT HEAD WITHOUT CONTRAST TECHNIQUE: Contiguous axial images were obtained from the base of the skull through the vertex without intravenous contrast. RADIATION DOSE REDUCTION: This exam was performed according to the departmental dose-optimization program which includes automated exposure control, adjustment of the mA and/or kV according to patient size and/or use of iterative reconstruction technique. COMPARISON:  Head CT 04/01/2022 FINDINGS: Brain: No evidence of acute infarction, hemorrhage, hydrocephalus, extra-axial collection or mass lesion/mass effect. Vascular: No hyperdense vessel or unexpected calcification. Skull: Normal. Negative for fracture or focal lesion. Sinuses/Orbits: No acute finding.  There has an old left medial orbital wall fracture. Other: None. IMPRESSION: No acute intracranial abnormality. Electronically Signed   By: Ronney Asters M.D.   On: 08/29/2022 23:05   DG Chest Port 1 View  Result Date: 08/29/2022 CLINICAL DATA:  Overdose. EXAM: PORTABLE CHEST 1 VIEW COMPARISON:  Chest radiograph dated 04/01/2022. FINDINGS: No focal consolidation, pleural effusion, pneumothorax. The cardiac silhouette is within limits. No acute osseous pathology. Degenerative changes of the spine and shoulders. IMPRESSION: No active disease. Electronically Signed   By: Anner Crete M.D.   On: 08/29/2022 23:00     Microbiology: Results for orders placed or performed during the hospital encounter of 08/29/22  Urine Culture (for pregnant, neutropenic or urologic patients or patients with an indwelling urinary catheter)     Status: Abnormal   Collection Time: 08/30/22  5:32 AM   Specimen: Urine, Clean Catch  Result Value Ref Range Status   Specimen Description URINE, CLEAN CATCH  Final   Special Requests NONE  Final   Culture (A)  Final    <10,000 COLONIES/mL INSIGNIFICANT GROWTH Performed at [facility], [address] [place], [place] [address]    Report Status 08/31/2022 FINAL  Final  Blood culture (routine x 2)     Status: None (Preliminary result)   Collection Time: 08/30/22  6:05 AM   Specimen: BLOOD RIGHT HAND  Result Value Ref Range Status   Specimen Description BLOOD RIGHT HAND  Final   Special Requests   Final    BOTTLES DRAWN AEROBIC AND ANAEROBIC Blood Culture results may not be optimal due to an inadequate volume of blood received in culture bottles   Culture   Final    NO GROWTH 4 DAYS Performed at [facility], [place] [address] [place], [place] [address]    Report Status PENDING  Incomplete  Blood culture (routine x 2)     Status: None (Preliminary result)   Collection Time: 08/30/22  6:16 AM   Specimen: BLOOD LEFT FOREARM  Result Value Ref Range Status   Specimen Description BLOOD LEFT FOREARM  Final   Special Requests   Final    BOTTLES DRAWN AEROBIC AND ANAEROBIC Blood Culture adequate volume   Culture   Final    NO GROWTH 4 DAYS Performed at [facility], [place] [address], [place], [place] [address]    Report Status PENDING  Incomplete    Labs: CBC: Recent Labs  Lab 08/29/22 2310 08/31/22 0412  WBC 7.0 5.1  NEUTROABS 5.3 2.7  HGB 13.1 10.6*  HCT 40.4 32.5*  MCV 84.2 82.9  PLT 205 [id number]   Basic Metabolic Panel: Recent Labs  Lab 08/29/22 2310 08/31/22 0412 09/02/22 0833 09/03/22 0250  NA 140 137 138 135  K 4.1 3.4* 4.0 3.9  CL 105  102 103 100  CO2 '24 24 25 25  '$ GLUCOSE 169* 185* 132* 184*  BUN '15 15 16 22  '$ CREATININE 1.11* 1.03* 0.92 0.91  CALCIUM 9.5 9.1 9.3 9.0  MG 1.7 1.3*  --   --   PHOS  --  3.8  --   --    Liver Function Tests: Recent Labs  Lab 08/29/22 2310 08/31/22 0412  AST 21 20  ALT 21 15  ALKPHOS 75 61  BILITOT 0.9 0.4  PROT 7.1 5.8*  ALBUMIN 3.8 2.9*   CBG: Recent Labs  Lab 09/03/22 0603 09/03/22 1135 09/03/22 1640 09/03/22 2117 09/04/22 0548  GLUCAP 149* 194* 209* 178* 150*  Discharge time spent: less than 30 minutes.  Signed: Annita Brod, MD [facility] 09/04/2022

## 2022-11-13 ENCOUNTER — Emergency Department (HOSPITAL_BASED_OUTPATIENT_CLINIC_OR_DEPARTMENT_OTHER)
Admission: EM | Admit: 2022-11-13 | Discharge: 2022-11-13 | Disposition: A | Discharge status: Home / Self Care | Payer: 59 | Attending: Emergency Medicine | Admitting: Emergency Medicine

## 2022-11-13 ENCOUNTER — Encounter (HOSPITAL_BASED_OUTPATIENT_CLINIC_OR_DEPARTMENT_OTHER): Payer: Self-pay | Admitting: Emergency Medicine

## 2022-11-13 ENCOUNTER — Emergency Department (HOSPITAL_BASED_OUTPATIENT_CLINIC_OR_DEPARTMENT_OTHER): Payer: 59

## 2022-11-13 ENCOUNTER — Other Ambulatory Visit: Payer: Self-pay

## 2022-11-13 DIAGNOSIS — W06XXXA Fall from bed, initial encounter: Secondary | ICD-10-CM | POA: Insufficient documentation

## 2022-11-13 DIAGNOSIS — W19XXXA Unspecified fall, initial encounter: Secondary | ICD-10-CM

## 2022-11-13 DIAGNOSIS — I10 Essential (primary) hypertension: Secondary | ICD-10-CM | POA: Insufficient documentation

## 2022-11-13 DIAGNOSIS — E119 Type 2 diabetes mellitus without complications: Secondary | ICD-10-CM | POA: Diagnosis not present

## 2022-11-13 DIAGNOSIS — Z7984 Long term (current) use of oral hypoglycemic drugs: Secondary | ICD-10-CM | POA: Insufficient documentation

## 2022-11-13 DIAGNOSIS — D649 Anemia, unspecified: Secondary | ICD-10-CM | POA: Diagnosis not present

## 2022-11-13 DIAGNOSIS — S0990XA Unspecified injury of head, initial encounter: Secondary | ICD-10-CM | POA: Diagnosis present

## 2022-11-13 DIAGNOSIS — N3000 Acute cystitis without hematuria: Secondary | ICD-10-CM

## 2022-11-13 DIAGNOSIS — Y92003 Bedroom of unspecified non-institutional (private) residence as the place of occurrence of the external cause: Secondary | ICD-10-CM | POA: Diagnosis not present

## 2022-11-13 LAB — URINALYSIS, ROUTINE W REFLEX MICROSCOPIC
Bilirubin Urine: NEGATIVE
Glucose, UA: NEGATIVE mg/dL
Hgb urine dipstick: NEGATIVE
Ketones, ur: 40 mg/dL — AB
Nitrite: NEGATIVE
Specific Gravity, Urine: 1.024 (ref 1.005–1.030)
Trans Epithel, UA: 1
pH: 5.5 (ref 5.0–8.0)

## 2022-11-13 LAB — BASIC METABOLIC PANEL
Anion gap: 17 — ABNORMAL HIGH (ref 5–15)
BUN: 24 mg/dL — ABNORMAL HIGH (ref 8–23)
CO2: 20 mmol/L — ABNORMAL LOW (ref 22–32)
Calcium: 9.7 mg/dL (ref 8.9–10.3)
Chloride: 102 mmol/L (ref 98–111)
Creatinine, Ser: 1.05 mg/dL — ABNORMAL HIGH (ref 0.44–1.00)
GFR, Estimated: >60 mL/min (ref >60)
Glucose, Bld: 103 mg/dL — ABNORMAL HIGH (ref 70–99)
Potassium: 4.2 mmol/L (ref 3.5–5.1)
Sodium: 139 mmol/L (ref 135–145)

## 2022-11-13 LAB — CBC
HCT: 36.1 % (ref 36.0–46.0)
Hemoglobin: 11.8 g/dL — ABNORMAL LOW (ref 12.0–15.0)
MCH: 26.7 pg (ref 26.0–34.0)
MCHC: 32.7 g/dL (ref 30.0–36.0)
MCV: 81.7 fL (ref 80.0–100.0)
Platelets: 192 10*3/uL (ref 150–400)
RBC: 4.42 MIL/uL (ref 3.87–5.11)
RDW: 17.3 % — ABNORMAL HIGH (ref 11.5–15.5)
WBC: 5.8 10*3/uL (ref 4.0–10.5)
nRBC: 0 % (ref 0.0–0.2)

## 2022-11-13 LAB — CK: Total CK: 222 U/L (ref 38–234)

## 2022-11-13 MED ORDER — SODIUM CHLORIDE 0.9 % IV SOLN
2.0000 g | Freq: Once | INTRAVENOUS | Status: AC
  Administered 2022-11-13: 2 g via INTRAVENOUS
  Filled 2022-11-13: qty 20

## 2022-11-13 MED ORDER — CEPHALEXIN 500 MG PO CAPS: 500.0000 mg | ORAL_CAPSULE | Freq: Four times a day (QID) | ORAL | 0 refills | Status: DC

## 2022-11-13 NOTE — ED Provider Notes (Signed)
[place] EMERGENCY DEPARTMENT AT [facility] Provider Note   CSN: [id number] Arrival date & time: 11/13/22  1645     History  Chief Complaint  Patient presents with   Taylor Benitez is a 62 y.o. female.  Patient with history of diabetes, hypertension, substance abuse presents today with complaints of fall.  She states that same occurred yesterday when she was attempting to walk to the bathroom.  She states that she slid off the bed onto the ground.  She states she was able to crawl around on the ground but was unable to completely get up off the floor.  She did not hit her head or lose consciousness.  She is not anticoagulated.  She states that she was admitted back in March after a baclofen overdose where she was also found to have a UTI.  States she was sent to rehab but her insurance would only cover 3 days and she feels like she has not been able to completely recover since this admission and would like home health resources.  She denies any focal bony tenderness or injuries from her fall.  Denies any headaches, shortness of breath, chest pain, nausea, vomiting, abdominal pain, diarrhea, hematochezia, melena, or urinary symptoms.  She states that since her discharge she has not been drinking alcohol and denies any drug use.  Does endorse concern that she may still have a urinary tract infection as when she was admitted previously with a UTI she did not have urinary symptoms at that time.  Denies any new changes in her condition since her discharge in March.  The history is provided by the patient. No language interpreter was used.  Fall       Home Medications Prior to Admission medications   Medication Sig Start Date End Date Taking? Authorizing Provider  acetaminophen (TYLENOL) 325 MG tablet Take 2 tablets (650 mg total) by mouth every 4 (four) hours as needed for mild pain (or temp > 37.5 C (99.5 F)). 09/04/22   Hollice Espy, MD  amitriptyline (ELAVIL) 100  MG tablet Take 1 tablet (100 mg total) by mouth at bedtime. 09/04/22   Hollice Espy, MD  atorvastatin (LIPITOR) 10 MG tablet Take 1 tablet (10 mg total) by mouth daily. 09/04/22 09/04/23  Hollice Espy, MD  empagliflozin (JARDIANCE) 25 MG TABS tablet Take 25 mg by mouth daily.    [provider]  escitalopram (LEXAPRO) 20 MG tablet Take 20 mg by mouth daily. 03/17/22   [provider]  folic acid (FOLVITE) 1 MG tablet Take 1 tablet (1 mg total) by mouth daily. 09/05/22   Hollice Espy, MD  levothyroxine (SYNTHROID) 125 MCG tablet Take 1 tablet (125 mcg total) by mouth daily at 6 (six) AM. 09/05/22   Hollice Espy, MD  lidocaine (LIDODERM) 5 % Place 1 patch onto the skin daily. Remove & Discard patch within 12 hours or as directed by MD 09/04/22   Hollice Espy, MD  metFORMIN (GLUMETZA) 1000 MG (MOD) 24 hr tablet Take 1,000 mg by mouth 2 (two) times daily with a meal.    [provider]  oxyCODONE (OXY IR/ROXICODONE) 5 MG immediate release tablet Take 1 tablet (5 mg total) by mouth 3 (three) times daily as needed for severe pain. 09/04/22   Hollice Espy, MD  Semaglutide (OZEMPIC, 0.25 OR 0.5 MG/DOSE, [place]) Inject 0.25 mg into the skin once a week. Mondays    [provider]  Allergies    Glipizide, Metformin and related, and Penicillins    Review of Systems   Review of Systems  All other systems reviewed and are negative.   Physical Exam Updated Vital Signs BP (!) 141/86   Pulse 89   Temp 97.9 F (36.6 C) (Oral)   Resp 18   Ht 5\' 1"  (1.549 m)   Wt 63.5 kg   SpO2 100%   BMI 26.45 kg/m  Physical Exam Vitals and nursing note reviewed.  Constitutional:      General: She is not in acute distress.    Appearance: Normal appearance. She is normal weight. She is not ill-appearing, toxic-appearing or diaphoretic.  HENT:     Head: Normocephalic and atraumatic.     Comments: No Battle sign or raccoon eyes Eyes:     Pupils:  Pupils are equal, round, and reactive to light.  Neck:     Comments: No tenderness to palpation of the cervical spine Cardiovascular:     Rate and Rhythm: Normal rate and regular rhythm.     Heart sounds: Normal heart sounds.  Pulmonary:     Effort: Pulmonary effort is normal. No respiratory distress.     Breath sounds: Normal breath sounds.  Abdominal:     General: Abdomen is flat.     Palpations: Abdomen is soft.     Tenderness: There is no abdominal tenderness.  Musculoskeletal:        General: Normal range of motion.     Cervical back: Normal range of motion and neck supple.     Comments: No focal bony tenderness.  Patient ambulatory with some assistance.  Skin:    General: Skin is warm and dry.  Neurological:     General: No focal deficit present.     Mental Status: She is alert and oriented to person, place, and time.  Psychiatric:        Mood and Affect: Mood normal.        Behavior: Behavior normal.     ED Results / Procedures / Treatments   Labs (all labs ordered are listed, but only abnormal results are displayed) Labs Reviewed  CBC - Abnormal; Notable for the following components:      Result Value   Hemoglobin 11.8 (*)    RDW 17.3 (*)    All other components within normal limits  BASIC METABOLIC PANEL - Abnormal; Notable for the following components:   CO2 20 (*)    Glucose, Bld 103 (*)    BUN 24 (*)    Creatinine, Ser 1.05 (*)    Anion gap 17 (*)    All other components within normal limits  URINALYSIS, ROUTINE W REFLEX MICROSCOPIC - Abnormal; Notable for the following components:   APPearance HAZY (*)    Ketones, ur 40 (*)    Protein, ur TRACE (*)    Leukocytes,Ua TRACE (*)    Bacteria, UA RARE (*)    All other components within normal limits  URINE CULTURE  CK    EKG None  Radiology CT Head Wo Contrast  Result Date: 11/13/2022 CLINICAL DATA:  Head trauma EXAM: CT HEAD WITHOUT CONTRAST TECHNIQUE: Contiguous axial images were obtained from the  base of the skull through the vertex without intravenous contrast. RADIATION DOSE REDUCTION: This exam was performed according to the departmental dose-optimization program which includes automated exposure control, adjustment of the mA and/or kV according to patient size and/or use of iterative reconstruction technique. COMPARISON:  08/30/2022 FINDINGS: Brain: There  is no mass, hemorrhage or extra-axial collection. The size and configuration of the ventricles and extra-axial CSF spaces are normal. The brain parenchyma is normal, without acute or chronic infarction. Vascular: No abnormal hyperdensity of the major intracranial arteries or dural venous sinuses. No intracranial atherosclerosis. Skull: The visualized skull base, calvarium and extracranial soft tissues are normal. Sinuses/Orbits: No fluid levels or advanced mucosal thickening of the visualized paranasal sinuses. No mastoid or middle ear effusion. The orbits are normal. IMPRESSION: Normal head CT. Electronically Signed   By: Deatra Robinson M.D.   On: 11/13/2022 19:26    Procedures Procedures    Medications Ordered in ED Medications  cefTRIAXone (ROCEPHIN) 2 g in sodium chloride 0.9 % 100 mL IVPB (2 g Intravenous New Bag/Given 11/13/22 2201)    ED Course/ Medical Decision Making/ A&P                             Medical Decision Making Amount and/or Complexity of Data Reviewed Labs: ordered. Radiology: ordered.   This patient is a 62 y.o. female who presents to the ED for concern of fall, this involves an extensive number of treatment options, and is a complaint that carries with it a high risk of complications and morbidity. The emergent differential diagnosis prior to evaluation includes, but is not limited to,  trauma, rhabdo, UTI, electrolyte derangement. This is not an exhaustive differential.   Past Medical History / Co-morbidities / Social History: history of diabetes, hypertension, substance abuse  Additional history: Chart  reviewed. Pertinent results include: patient given rocephin --> Keflex for UTI during her admission.  Culture with insignificant growth  Physical Exam: Physical exam performed. The pertinent findings include: Per above, no pertinent physical exam findings. Patient ambulatory  Lab Tests: I ordered, and personally interpreted labs.  The pertinent results include:  hgb 11.8, bicarb 20, anion gap 17 likely due to starvation ketoacidosis as patient states she has not eaten in a few days. UA with ketones, trace leukocytes, 21-50 WBCs. Culture pending   Imaging Studies: I ordered imaging studies including CT head. I independently visualized and interpreted imaging which showed NAD. I agree with the radiologist interpretation.   Medications: I ordered medication including rocephin  for UTI. Reevaluation of the patient after these medicines showed that the patient stayed the same. I have reviewed the patients home medicines and have made adjustments as needed.    Disposition: After consideration of the diagnostic results and the patients response to treatment, I feel that emergency department workup does not suggest an emergent condition requiring admission or immediate intervention beyond what has been performed at this time. The plan is: discharge with close outpatient follow-up. Also sent for home health and PT/OT at home. Will also send for keflex to cover possible UTI. Evaluation and diagnostic testing in the emergency department does not suggest an emergent condition requiring admission or immediate intervention beyond what has been performed at this time.  Plan for discharge with close PCP follow-up.  Patient is understanding and amenable with plan, educated on red flag symptoms that would prompt immediate return.  Patient discharged in stable condition.   Final Clinical Impression(s) / ED Diagnoses Final diagnoses:  Fall, initial encounter  Acute cystitis without hematuria    Rx / DC  Orders ED Discharge Orders          Ordered    cephALEXin (KEFLEX) 500 MG capsule  4 times daily  11/13/22 2255    Home Health        11/13/22 2257    Face-to-face encounter (required for Medicare/Medicaid patients)       Comments: I Shawn Route Lluvia Gwynne certify that this patient is under my care and that I, or a nurse practitioner or physician's assistant working with me, had a face-to-face encounter that meets the physician face-to-face encounter requirements with this patient on 11/13/2022. The encounter with the patient was in whole, or in part for the following medical condition(s) which is the primary reason for home health care (List medical condition): multiple falls, generalized deconditioning   11/13/22 2257          An After Visit Summary was printed and given to the patient.     Taylor Benitez 11/13/22 2259    Rondel Baton, MD 11/14/22 [phone]

## 2022-11-13 NOTE — ED Notes (Signed)
Snacks given 

## 2022-11-13 NOTE — ED Triage Notes (Signed)
Per [facility] pt coming from home after falling yesterday while attempting to get to bathroom. Patient states she slid off of the bed onto the ground. Reports frequent falls due to balance issues. Patient states she was recently hospitalized and was sent to rehab but was sent home after just a few days and feels like she needs more time.

## 2022-11-13 NOTE — Discharge Instructions (Signed)
As we discussed, your workup in the ER today was reassuring for acute findings.  I have given you a prescription for Keflex to cover for urinary tract infection and recommend that you take it as prescribed in its entirety.  I have also for you to have home health and physical therapy/Occupational Therapy come out to your home to help with rehabilitation.  They should call you in the next few days for scheduling.  I recommend that you call your primary care doctor to set up close follow-up.  Return if development of any new or worsening symptoms.

## 2022-11-15 LAB — URINE CULTURE

## 2022-11-18 ENCOUNTER — Telehealth: Payer: Self-pay

## 2022-11-18 NOTE — Transitions of Care (Post Inpatient/ED Visit) (Signed)
   11/18/2022  Name: Taylor Benitez MRN: [id number] DOB: 1960-10-04  Today's TOC FU Call Status: Today's TOC FU Call Status:: Unsuccessul Call (1st Attempt)  Attempted to reach the patient regarding the most recent Inpatient/ED visit.  Follow Up Plan: Additional outreach attempts will be made to reach the patient to complete the Transitions of Care (Post Inpatient/ED visit) call.   Signature Donnamarie Poag, CMA

## 2023-10-05 ENCOUNTER — Emergency Department (HOSPITAL_COMMUNITY)

## 2023-10-05 ENCOUNTER — Inpatient Hospital Stay (HOSPITAL_COMMUNITY)
Admission: EM | Admit: 2023-10-05 | Discharge: 2023-10-08 | DRG: 917 | Disposition: A | Discharge status: Home Health | Attending: Internal Medicine | Admitting: Internal Medicine

## 2023-10-05 ENCOUNTER — Other Ambulatory Visit: Payer: Self-pay

## 2023-10-05 DIAGNOSIS — Z823 Family history of stroke: Secondary | ICD-10-CM

## 2023-10-05 DIAGNOSIS — W06XXXA Fall from bed, initial encounter: Secondary | ICD-10-CM | POA: Diagnosis present

## 2023-10-05 DIAGNOSIS — R4701 Aphasia: Secondary | ICD-10-CM | POA: Diagnosis not present

## 2023-10-05 DIAGNOSIS — M069 Rheumatoid arthritis, unspecified: Secondary | ICD-10-CM | POA: Diagnosis present

## 2023-10-05 DIAGNOSIS — Z7985 Long-term (current) use of injectable non-insulin antidiabetic drugs: Secondary | ICD-10-CM

## 2023-10-05 DIAGNOSIS — Z833 Family history of diabetes mellitus: Secondary | ICD-10-CM

## 2023-10-05 DIAGNOSIS — E871 Hypo-osmolality and hyponatremia: Secondary | ICD-10-CM | POA: Diagnosis present

## 2023-10-05 DIAGNOSIS — Z9049 Acquired absence of other specified parts of digestive tract: Secondary | ICD-10-CM

## 2023-10-05 DIAGNOSIS — E872 Acidosis, unspecified: Secondary | ICD-10-CM | POA: Diagnosis present

## 2023-10-05 DIAGNOSIS — Z880 Allergy status to penicillin: Secondary | ICD-10-CM

## 2023-10-05 DIAGNOSIS — Z7984 Long term (current) use of oral hypoglycemic drugs: Secondary | ICD-10-CM

## 2023-10-05 DIAGNOSIS — T40601A Poisoning by unspecified narcotics, accidental (unintentional), initial encounter: Secondary | ICD-10-CM | POA: Diagnosis not present

## 2023-10-05 DIAGNOSIS — Z87891 Personal history of nicotine dependence: Secondary | ICD-10-CM

## 2023-10-05 DIAGNOSIS — Z8249 Family history of ischemic heart disease and other diseases of the circulatory system: Secondary | ICD-10-CM

## 2023-10-05 DIAGNOSIS — R748 Abnormal levels of other serum enzymes: Secondary | ICD-10-CM

## 2023-10-05 DIAGNOSIS — M6282 Rhabdomyolysis: Secondary | ICD-10-CM | POA: Diagnosis present

## 2023-10-05 DIAGNOSIS — I10 Essential (primary) hypertension: Secondary | ICD-10-CM | POA: Diagnosis present

## 2023-10-05 DIAGNOSIS — Z96643 Presence of artificial hip joint, bilateral: Secondary | ICD-10-CM | POA: Diagnosis present

## 2023-10-05 DIAGNOSIS — Z818 Family history of other mental and behavioral disorders: Secondary | ICD-10-CM

## 2023-10-05 DIAGNOSIS — F1310 Sedative, hypnotic or anxiolytic abuse, uncomplicated: Secondary | ICD-10-CM | POA: Diagnosis present

## 2023-10-05 DIAGNOSIS — Z888 Allergy status to other drugs, medicaments and biological substances status: Secondary | ICD-10-CM

## 2023-10-05 DIAGNOSIS — Z79890 Hormone replacement therapy (postmenopausal): Secondary | ICD-10-CM

## 2023-10-05 DIAGNOSIS — Z79899 Other long term (current) drug therapy: Secondary | ICD-10-CM

## 2023-10-05 DIAGNOSIS — K7469 Other cirrhosis of liver: Secondary | ICD-10-CM | POA: Diagnosis present

## 2023-10-05 DIAGNOSIS — N1831 Chronic kidney disease, stage 3a: Secondary | ICD-10-CM | POA: Diagnosis present

## 2023-10-05 DIAGNOSIS — Z8261 Family history of arthritis: Secondary | ICD-10-CM

## 2023-10-05 DIAGNOSIS — Z90710 Acquired absence of both cervix and uterus: Secondary | ICD-10-CM

## 2023-10-05 DIAGNOSIS — Z90722 Acquired absence of ovaries, bilateral: Secondary | ICD-10-CM

## 2023-10-05 DIAGNOSIS — Z813 Family history of other psychoactive substance abuse and dependence: Secondary | ICD-10-CM

## 2023-10-05 DIAGNOSIS — R0902 Hypoxemia: Secondary | ICD-10-CM | POA: Diagnosis present

## 2023-10-05 DIAGNOSIS — E1140 Type 2 diabetes mellitus with diabetic neuropathy, unspecified: Secondary | ICD-10-CM | POA: Diagnosis present

## 2023-10-05 DIAGNOSIS — Z825 Family history of asthma and other chronic lower respiratory diseases: Secondary | ICD-10-CM

## 2023-10-05 DIAGNOSIS — D649 Anemia, unspecified: Secondary | ICD-10-CM | POA: Diagnosis present

## 2023-10-05 DIAGNOSIS — Z96653 Presence of artificial knee joint, bilateral: Secondary | ICD-10-CM | POA: Diagnosis present

## 2023-10-05 DIAGNOSIS — N179 Acute kidney failure, unspecified: Secondary | ICD-10-CM | POA: Diagnosis present

## 2023-10-05 DIAGNOSIS — T50901A Poisoning by unspecified drugs, medicaments and biological substances, accidental (unintentional), initial encounter: Principal | ICD-10-CM | POA: Diagnosis present

## 2023-10-05 DIAGNOSIS — Z89422 Acquired absence of other left toe(s): Secondary | ICD-10-CM

## 2023-10-05 DIAGNOSIS — E1122 Type 2 diabetes mellitus with diabetic chronic kidney disease: Secondary | ICD-10-CM | POA: Diagnosis present

## 2023-10-05 DIAGNOSIS — E039 Hypothyroidism, unspecified: Secondary | ICD-10-CM | POA: Diagnosis present

## 2023-10-05 DIAGNOSIS — E860 Dehydration: Secondary | ICD-10-CM | POA: Diagnosis present

## 2023-10-05 DIAGNOSIS — Z822 Family history of deafness and hearing loss: Secondary | ICD-10-CM

## 2023-10-05 DIAGNOSIS — G9341 Metabolic encephalopathy: Secondary | ICD-10-CM | POA: Diagnosis present

## 2023-10-05 DIAGNOSIS — E079 Disorder of thyroid, unspecified: Secondary | ICD-10-CM | POA: Diagnosis present

## 2023-10-05 DIAGNOSIS — F1210 Cannabis abuse, uncomplicated: Secondary | ICD-10-CM | POA: Diagnosis present

## 2023-10-05 DIAGNOSIS — Z9079 Acquired absence of other genital organ(s): Secondary | ICD-10-CM

## 2023-10-05 DIAGNOSIS — E1165 Type 2 diabetes mellitus with hyperglycemia: Secondary | ICD-10-CM | POA: Diagnosis present

## 2023-10-05 DIAGNOSIS — Z8711 Personal history of peptic ulcer disease: Secondary | ICD-10-CM

## 2023-10-05 DIAGNOSIS — Z83438 Family history of other disorder of lipoprotein metabolism and other lipidemia: Secondary | ICD-10-CM

## 2023-10-05 LAB — CBC WITH DIFFERENTIAL/PLATELET
Abs Immature Granulocytes: 0.14 10*3/uL — ABNORMAL HIGH (ref 0.00–0.07)
Basophils Absolute: 0 10*3/uL (ref 0.0–0.1)
Basophils Relative: 0 %
Eosinophils Absolute: 0 10*3/uL (ref 0.0–0.5)
Eosinophils Relative: 0 %
HCT: 35.1 % — ABNORMAL LOW (ref 36.0–46.0)
Hemoglobin: 11.5 g/dL — ABNORMAL LOW (ref 12.0–15.0)
Immature Granulocytes: 1 %
Lymphocytes Relative: 5 %
Lymphs Abs: 0.6 10*3/uL — ABNORMAL LOW (ref 0.7–4.0)
MCH: 28.7 pg (ref 26.0–34.0)
MCHC: 32.8 g/dL (ref 30.0–36.0)
MCV: 87.5 fL (ref 80.0–100.0)
Monocytes Absolute: 0.8 10*3/uL (ref 0.1–1.0)
Monocytes Relative: 7 %
Neutro Abs: 10.6 10*3/uL — ABNORMAL HIGH (ref 1.7–7.7)
Neutrophils Relative %: 87 %
Platelets: 231 10*3/uL (ref 150–400)
RBC: 4.01 MIL/uL (ref 3.87–5.11)
RDW: 13.7 % (ref 11.5–15.5)
WBC: 12.2 10*3/uL — ABNORMAL HIGH (ref 4.0–10.5)
nRBC: 0 % (ref 0.0–0.2)

## 2023-10-05 LAB — COMPREHENSIVE METABOLIC PANEL WITH GFR
ALT: 36 U/L (ref 0–44)
AST: 44 U/L — ABNORMAL HIGH (ref 15–41)
Albumin: 3.8 g/dL (ref 3.5–5.0)
Alkaline Phosphatase: 60 U/L (ref 38–126)
Anion gap: 13 (ref 5–15)
BUN: 24 mg/dL — ABNORMAL HIGH (ref 8–23)
CO2: 21 mmol/L — ABNORMAL LOW (ref 22–32)
Calcium: 9.3 mg/dL (ref 8.9–10.3)
Chloride: 97 mmol/L — ABNORMAL LOW (ref 98–111)
Creatinine, Ser: 1.48 mg/dL — ABNORMAL HIGH (ref 0.44–1.00)
GFR, Estimated: 40 mL/min — ABNORMAL LOW (ref >60)
Glucose, Bld: 519 mg/dL (ref 70–99)
Potassium: 4.7 mmol/L (ref 3.5–5.1)
Sodium: 131 mmol/L — ABNORMAL LOW (ref 135–145)
Total Bilirubin: 0.5 mg/dL (ref 0.0–1.2)
Total Protein: 7 g/dL (ref 6.5–8.1)

## 2023-10-05 LAB — PROTIME-INR
INR: 1.1 (ref 0.8–1.2)
Prothrombin Time: 14.6 s (ref 11.4–15.2)

## 2023-10-05 LAB — RAPID URINE DRUG SCREEN, HOSP PERFORMED
Amphetamines: NOT DETECTED
Barbiturates: NOT DETECTED
Benzodiazepines: POSITIVE — AB
Cocaine: NOT DETECTED
Opiates: NOT DETECTED
Tetrahydrocannabinol: POSITIVE — AB

## 2023-10-05 LAB — URINALYSIS, ROUTINE W REFLEX MICROSCOPIC
Bilirubin Urine: NEGATIVE
Glucose, UA: >=500 mg/dL — AB
Ketones, ur: NEGATIVE mg/dL
Nitrite: NEGATIVE
Protein, ur: 30 mg/dL — AB
Specific Gravity, Urine: 1.013 (ref 1.005–1.030)
pH: 6 (ref 5.0–8.0)

## 2023-10-05 LAB — CK: Total CK: 837 U/L — ABNORMAL HIGH (ref 38–234)

## 2023-10-05 LAB — AMMONIA: Ammonia: 26 umol/L (ref 9–35)

## 2023-10-05 LAB — LACTIC ACID, PLASMA: Lactic Acid, Venous: 3.2 mmol/L (ref 0.5–1.9)

## 2023-10-05 LAB — ACETAMINOPHEN LEVEL: Acetaminophen (Tylenol), Serum: <10 ug/mL — ABNORMAL LOW (ref 10–30)

## 2023-10-05 LAB — CBG MONITORING, ED
Glucose-Capillary: 433 mg/dL — ABNORMAL HIGH (ref 70–99)
Glucose-Capillary: 283 mg/dL — ABNORMAL HIGH (ref 70–99)

## 2023-10-05 LAB — ETHANOL: Alcohol, Ethyl (B): <10 mg/dL (ref <10)

## 2023-10-05 MED ORDER — INSULIN ASPART 100 UNIT/ML IJ SOLN
6.0000 [IU] | Freq: Once | INTRAMUSCULAR | Status: AC
Start: 2023-10-05 — End: 2023-10-05
  Administered 2023-10-05: 6 [IU] via INTRAVENOUS
  Filled 2023-10-05: qty 0.06

## 2023-10-05 MED ORDER — LACTATED RINGERS IV BOLUS
1000.0000 mL | Freq: Once | INTRAVENOUS | Status: AC
  Administered 2023-10-05: 1000 mL via INTRAVENOUS
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
## 2023-10-06 ENCOUNTER — Encounter (HOSPITAL_COMMUNITY): Payer: Self-pay | Admitting: Internal Medicine

## 2023-10-06 DIAGNOSIS — W06XXXA Fall from bed, initial encounter: Secondary | ICD-10-CM | POA: Diagnosis present

## 2023-10-06 DIAGNOSIS — T50901A Poisoning by unspecified drugs, medicaments and biological substances, accidental (unintentional), initial encounter: Secondary | ICD-10-CM | POA: Diagnosis not present

## 2023-10-06 DIAGNOSIS — E872 Acidosis, unspecified: Secondary | ICD-10-CM | POA: Diagnosis present

## 2023-10-06 DIAGNOSIS — D649 Anemia, unspecified: Secondary | ICD-10-CM | POA: Diagnosis present

## 2023-10-06 DIAGNOSIS — Z7984 Long term (current) use of oral hypoglycemic drugs: Secondary | ICD-10-CM | POA: Diagnosis not present

## 2023-10-06 DIAGNOSIS — E1122 Type 2 diabetes mellitus with diabetic chronic kidney disease: Secondary | ICD-10-CM | POA: Diagnosis present

## 2023-10-06 DIAGNOSIS — K7469 Other cirrhosis of liver: Secondary | ICD-10-CM

## 2023-10-06 DIAGNOSIS — F1210 Cannabis abuse, uncomplicated: Secondary | ICD-10-CM | POA: Diagnosis present

## 2023-10-06 DIAGNOSIS — E079 Disorder of thyroid, unspecified: Secondary | ICD-10-CM

## 2023-10-06 DIAGNOSIS — E1165 Type 2 diabetes mellitus with hyperglycemia: Secondary | ICD-10-CM

## 2023-10-06 DIAGNOSIS — I10 Essential (primary) hypertension: Secondary | ICD-10-CM

## 2023-10-06 DIAGNOSIS — Z825 Family history of asthma and other chronic lower respiratory diseases: Secondary | ICD-10-CM | POA: Diagnosis not present

## 2023-10-06 DIAGNOSIS — M6282 Rhabdomyolysis: Secondary | ICD-10-CM | POA: Diagnosis present

## 2023-10-06 DIAGNOSIS — E039 Hypothyroidism, unspecified: Secondary | ICD-10-CM | POA: Diagnosis present

## 2023-10-06 DIAGNOSIS — Z79899 Other long term (current) drug therapy: Secondary | ICD-10-CM | POA: Diagnosis not present

## 2023-10-06 DIAGNOSIS — E871 Hypo-osmolality and hyponatremia: Secondary | ICD-10-CM | POA: Diagnosis present

## 2023-10-06 DIAGNOSIS — G9341 Metabolic encephalopathy: Secondary | ICD-10-CM | POA: Diagnosis present

## 2023-10-06 DIAGNOSIS — E860 Dehydration: Secondary | ICD-10-CM | POA: Diagnosis present

## 2023-10-06 DIAGNOSIS — R4701 Aphasia: Secondary | ICD-10-CM | POA: Diagnosis not present

## 2023-10-06 DIAGNOSIS — Z7985 Long-term (current) use of injectable non-insulin antidiabetic drugs: Secondary | ICD-10-CM | POA: Diagnosis not present

## 2023-10-06 DIAGNOSIS — M069 Rheumatoid arthritis, unspecified: Secondary | ICD-10-CM | POA: Diagnosis present

## 2023-10-06 DIAGNOSIS — T40601A Poisoning by unspecified narcotics, accidental (unintentional), initial encounter: Secondary | ICD-10-CM | POA: Diagnosis present

## 2023-10-06 DIAGNOSIS — Z79890 Hormone replacement therapy (postmenopausal): Secondary | ICD-10-CM | POA: Diagnosis not present

## 2023-10-06 DIAGNOSIS — E1140 Type 2 diabetes mellitus with diabetic neuropathy, unspecified: Secondary | ICD-10-CM | POA: Diagnosis present

## 2023-10-06 DIAGNOSIS — F1310 Sedative, hypnotic or anxiolytic abuse, uncomplicated: Secondary | ICD-10-CM | POA: Diagnosis present

## 2023-10-06 DIAGNOSIS — N1831 Chronic kidney disease, stage 3a: Secondary | ICD-10-CM | POA: Diagnosis present

## 2023-10-06 DIAGNOSIS — N179 Acute kidney failure, unspecified: Secondary | ICD-10-CM | POA: Diagnosis present

## 2023-10-06 LAB — COMPREHENSIVE METABOLIC PANEL WITH GFR
ALT: 157 U/L — ABNORMAL HIGH (ref 0–44)
AST: 518 U/L — ABNORMAL HIGH (ref 15–41)
Albumin: 3.6 g/dL (ref 3.5–5.0)
Alkaline Phosphatase: 54 U/L (ref 38–126)
Anion gap: 11 (ref 5–15)
BUN: 34 mg/dL — ABNORMAL HIGH (ref 8–23)
CO2: 20 mmol/L — ABNORMAL LOW (ref 22–32)
Calcium: 9.6 mg/dL (ref 8.9–10.3)
Chloride: 98 mmol/L (ref 98–111)
Creatinine, Ser: 1.88 mg/dL — ABNORMAL HIGH (ref 0.44–1.00)
GFR, Estimated: 30 mL/min — ABNORMAL LOW (ref >60)
Glucose, Bld: 170 mg/dL — ABNORMAL HIGH (ref 70–99)
Potassium: 4.7 mmol/L (ref 3.5–5.1)
Sodium: 129 mmol/L — ABNORMAL LOW (ref 135–145)
Total Bilirubin: 0.9 mg/dL (ref 0.0–1.2)
Total Protein: 6.7 g/dL (ref 6.5–8.1)

## 2023-10-06 LAB — CBC
HCT: 30.5 % — ABNORMAL LOW (ref 36.0–46.0)
Hemoglobin: 9.7 g/dL — ABNORMAL LOW (ref 12.0–15.0)
MCH: 28.6 pg (ref 26.0–34.0)
MCHC: 31.8 g/dL (ref 30.0–36.0)
MCV: 90 fL (ref 80.0–100.0)
Platelets: 140 10*3/uL — ABNORMAL LOW (ref 150–400)
RBC: 3.39 MIL/uL — ABNORMAL LOW (ref 3.87–5.11)
RDW: 14.3 % (ref 11.5–15.5)
WBC: 5.8 10*3/uL (ref 4.0–10.5)
nRBC: 0 % (ref 0.0–0.2)

## 2023-10-06 LAB — PHOSPHORUS
Phosphorus: 6.2 mg/dL — ABNORMAL HIGH (ref 2.5–4.6)
Phosphorus: 5.4 mg/dL — ABNORMAL HIGH (ref 2.5–4.6)

## 2023-10-06 LAB — LACTIC ACID, PLASMA
Lactic Acid, Venous: 2.2 mmol/L (ref 0.5–1.9)
Lactic Acid, Venous: 3.9 mmol/L (ref 0.5–1.9)

## 2023-10-06 LAB — PROCALCITONIN: Procalcitonin: 0.25 ng/mL

## 2023-10-06 LAB — TSH: TSH: <0.01 u[IU]/mL — ABNORMAL LOW (ref 0.350–4.500)

## 2023-10-06 LAB — T4, FREE: Free T4: 1.46 ng/dL — ABNORMAL HIGH (ref 0.61–1.12)

## 2023-10-06 LAB — CREATININE, URINE, RANDOM: Creatinine, Urine: 395 mg/dL

## 2023-10-06 LAB — CBG MONITORING, ED
Glucose-Capillary: 110 mg/dL — ABNORMAL HIGH (ref 70–99)
Glucose-Capillary: 188 mg/dL — ABNORMAL HIGH (ref 70–99)

## 2023-10-06 LAB — HIV ANTIBODY (ROUTINE TESTING W REFLEX): HIV Screen 4th Generation wRfx: NONREACTIVE

## 2023-10-06 LAB — GLUCOSE, CAPILLARY
Glucose-Capillary: 158 mg/dL — ABNORMAL HIGH (ref 70–99)
Glucose-Capillary: 179 mg/dL — ABNORMAL HIGH (ref 70–99)
Glucose-Capillary: 180 mg/dL — ABNORMAL HIGH (ref 70–99)
Glucose-Capillary: 197 mg/dL — ABNORMAL HIGH (ref 70–99)
Glucose-Capillary: 74 mg/dL (ref 70–99)

## 2023-10-06 LAB — CK: Total CK: 28903 U/L — ABNORMAL HIGH (ref 38–234)

## 2023-10-06 LAB — MAGNESIUM
Magnesium: 2.1 mg/dL (ref 1.7–2.4)
Magnesium: 2.2 mg/dL (ref 1.7–2.4)

## 2023-10-06 LAB — OSMOLALITY, URINE: Osmolality, Ur: 1063 mosm/kg — ABNORMAL HIGH (ref 300–900)

## 2023-10-06 LAB — SODIUM, URINE, RANDOM: Sodium, Ur: 15 mmol/L

## 2023-10-06 LAB — OSMOLALITY: Osmolality: 293 mosm/kg (ref 275–295)

## 2023-10-06 MED ORDER — SODIUM CHLORIDE 0.9 % IV SOLN: INTRAVENOUS | Status: AC

## 2023-10-06 MED ORDER — BACLOFEN 10 MG PO TABS
5.0000 mg | ORAL_TABLET | Freq: Three times a day (TID) | ORAL | Status: DC | PRN
  Administered 2023-10-06 – 2023-10-08 (×2): 5 mg via ORAL
  Filled 2023-10-06 (×2): qty 1

## 2023-10-06 MED ORDER — ACETAMINOPHEN 325 MG PO TABS
650.0000 mg | ORAL_TABLET | Freq: Four times a day (QID) | ORAL | Status: DC | PRN
  Administered 2023-10-06: 650 mg via ORAL
  Filled 2023-10-06: qty 2

## 2023-10-06 MED ORDER — INSULIN ASPART 100 UNIT/ML IJ SOLN
0.0000 [IU] | INTRAMUSCULAR | Status: DC
  Administered 2023-10-06 (×3): 2 [IU] via SUBCUTANEOUS
  Administered 2023-10-07 (×2): 1 [IU] via SUBCUTANEOUS
  Administered 2023-10-07 (×2): 2 [IU] via SUBCUTANEOUS
  Administered 2023-10-07 – 2023-10-08 (×3): 1 [IU] via SUBCUTANEOUS
  Filled 2023-10-06: qty 0.09

## 2023-10-06 MED ORDER — LEVOTHYROXINE SODIUM 25 MCG PO TABS
125.0000 ug | ORAL_TABLET | Freq: Every day | ORAL | Status: DC
  Administered 2023-10-07 – 2023-10-08 (×2): 125 ug via ORAL
  Filled 2023-10-06 (×2): qty 1

## 2023-10-06 MED ORDER — SODIUM CHLORIDE 0.9 % IV SOLN: INTRAVENOUS | Status: DC

## 2023-10-06 MED ORDER — NALOXONE HCL 0.4 MG/ML IJ SOLN
0.4000 mg | Freq: Once | INTRAMUSCULAR | Status: AC
  Administered 2023-10-06: 0.4 mg via INTRAVENOUS
  Filled 2023-10-06: qty 1

## 2023-10-06 MED ORDER — NALOXONE HCL 0.4 MG/ML IJ SOLN
0.4000 mg | INTRAMUSCULAR | Status: DC | PRN
  Administered 2023-10-06: 0.4 mg via INTRAVENOUS
  Filled 2023-10-06: qty 1

## 2023-10-06 MED ORDER — ONDANSETRON HCL 4 MG/2ML IJ SOLN
4.0000 mg | Freq: Four times a day (QID) | INTRAMUSCULAR | Status: DC | PRN
  Administered 2023-10-07: 4 mg via INTRAVENOUS
  Filled 2023-10-06: qty 2

## 2023-10-06 MED ORDER — ALBUTEROL SULFATE (2.5 MG/3ML) 0.083% IN NEBU: 2.5000 mg | INHALATION_SOLUTION | RESPIRATORY_TRACT | Status: DC | PRN

## 2023-10-06 MED ORDER — LEVOTHYROXINE SODIUM 25 MCG PO TABS
125.0000 ug | ORAL_TABLET | Freq: Every day | ORAL | Status: DC
  Administered 2023-10-06: 125 ug via ORAL
  Filled 2023-10-06: qty 1

## 2023-10-06 MED ORDER — ONDANSETRON HCL 4 MG PO TABS: 4.0000 mg | ORAL_TABLET | Freq: Four times a day (QID) | ORAL | Status: DC | PRN

## 2023-10-06 MED ORDER — LIDOCAINE 5 % EX PTCH: 1.0000 | MEDICATED_PATCH | CUTANEOUS | Status: DC

## 2023-10-06 MED ORDER — ALBUTEROL SULFATE (2.5 MG/3ML) 0.083% IN NEBU
2.5000 mg | INHALATION_SOLUTION | Freq: Four times a day (QID) | RESPIRATORY_TRACT | Status: DC | PRN
  Administered 2023-10-06: 2.5 mg via RESPIRATORY_TRACT
  Filled 2023-10-06: qty 3

## 2023-10-06 NOTE — ED Notes (Signed)
 Bladder scan this morning bS 0 ml

## 2023-10-06 NOTE — Progress Notes (Signed)
 Patient had been taken off O2 due to being A&O and saturating 99-100%. Went in her room to check on her and she had dropped her food on the floor and was very restless and taking gasping breaths. Checked O2 it was 68%. Put her on NRB at 15L which brought her O2 back to 100%, and I gave her Narcan. Her orientation returned to baseline and is now 3L on nasal canula and placed on continuous pulse ox. Will continue to monitor. Rapid response RN Zoey at bedside and MD [facility] notified.
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
## 2023-10-06 NOTE — Subjective & Objective (Addendum)
 Found down next to her bed she only admits to [facility]  But was found sleepy and hypoxic  Did not Need repeat Narcan doses as of yet Lactic acid in 3.4 CXR non acute  She is arousable 84% on RA CBG 569 Now 284 ET CO2 90 initially

## 2023-10-06 NOTE — Assessment & Plan Note (Addendum)
 Nontraumatic in the setting of being found down.  Follow CK Rehydrate

## 2023-10-06 NOTE — Progress Notes (Addendum)
 PROGRESS NOTE  Taylor Benitez GNF:[id number] DOB: 02/21/1961 DOA: 10/05/2023 PCP: Judy Pimple, MD   LOS: 0 days   Brief Narrative / Interim history: 63 year old female with history of DM2, hypothyroidism, hypertension, liver cirrhosis, history of polysubstance use comes into the hospital after being found down next to her bed, was lethargic as well as hypoxic.  Apparently she fell out of bed and has been laying on the ground for an estimated half a day.  On initial evaluation was given Narcan and became awake and alert and complaining of generalized pain.  She denied opioid use but did state that she took couple of klonopin pills and smoked marijuana.  Apparently her roommate is doing fentanyl and heroin, and she tells me she did come in contact with a purple crystallized powder but denies consuming it.  She also mentions that on occasion she has to call 911 on her roommate due to unresponsiveness  Subjective / 24h Interval events: Denies any shortness of breath, denies any chest pains this morning.  Assesement and Plan: Principal Problem:   Overdose, accidental or unintentional, initial encounter Active Problems:   Thyroid disease   Type 2 diabetes mellitus with hyperglycemia (HCC)   Other cirrhosis of liver (HCC)   Essential hypertension   Acute metabolic encephalopathy   Hyponatremia   Lactic acidosis   Rhabdomyolysis   Principal problem Acute metabolic encephalopathy -with concern for polysubstance abuse.  Urine drug screen was positive for benzodiazepine as well as marijuana.  Continue to closely monitor - She is alert on my evaluation  Active problems Rhabdomyolysis -in the setting of being down, nontraumatic.  CK with dramatic rise from 837 on admission to almost 29,000 this morning.  Placed on IV fluids  Hypothyroidism-TSH undetectable.  Hold Synthroid for now  Lactic acidosis-in the setting of being down.  On fluids  DM2, poorly controlled, with hyperglycemia  -continue sliding scale.  Most recent A1c 8.0  Hyponatremia-pseudohyponatremia due to hyperglycemia  Acute kidney injury -possibly in the setting of rhabdomyolysis, closely watch renal function  Liver cirrhosis-with remote EtOH use.  Appears compensated   Scheduled Meds:  insulin aspart  0-9 Units Subcutaneous Q4H   Continuous Infusions:  sodium chloride     PRN Meds:.acetaminophen, albuterol, naLOXone (NARCAN)  injection  Current Outpatient Medications  Medication Instructions   acetaminophen (TYLENOL) 650 mg, Oral, Every 4 hours PRN   albuterol (VENTOLIN HFA) 108 (90 Base) MCG/ACT inhaler 2 puffs, Inhalation, Every 6 hours PRN   atorvastatin (LIPITOR) 10 mg, Oral, Daily   baclofen (LIORESAL) 10 mg, Oral, 3 times daily PRN   empagliflozin (JARDIANCE) 25 mg, Oral, Daily   levothyroxine (SYNTHROID) 150 mcg, Oral, Daily before breakfast   lidocaine (LIDODERM) 5 % 1 patch, Transdermal, Every 24 hours, Remove & Discard patch within 12 hours or as directed by MD   lisinopril (ZESTRIL) 5 mg, Oral, Daily   metFORMIN (GLUMETZA) 1,000 mg, Oral, 2 times daily with meals      DVT prophylaxis:    Lab Results  Component Value Date   PLT 231 10/05/2023      Code Status: Full Code  Family Communication: No family at bedside  Status is: Observation The patient will require care spanning > 2 midnights and should be moved to inpatient because: IV fluids, rhabdomyolysis  Level of care: Progressive  Consultants:  None  Objective: Vitals:   10/06/23 0045 10/06/23 0304 10/06/23 0409 10/06/23 0809  BP: (!) 148/68  91/67 125/80  Pulse: 88  68  86  Resp:   19 16  Temp:  98.1 F (36.7 C) 98.1 F (36.7 C) 99.8 F (37.7 C)  TempSrc:  Oral  Oral  SpO2: 100%  100% 98%  Weight:      Height:       No intake or output data in the 24 hours ending 10/06/23 0924 Wt Readings from Last 3 Encounters:  10/05/23 69.9 kg  11/13/22 63.5 kg  08/29/22 63.5 kg     Examination:  Constitutional: NAD Eyes: no scleral icterus ENMT: Mucous membranes are moist.  Neck: normal, supple Respiratory: clear to auscultation bilaterally, no wheezing, no crackles.  Cardiovascular: Regular rate and rhythm, no murmurs / rubs / gallops. No LE edema.  Abdomen: non distended, no tenderness. Bowel sounds positive.  Musculoskeletal: no clubbing / cyanosis.   Data Reviewed: I have independently reviewed following labs and imaging studies   CBC Recent Labs  Lab 10/05/23 1931  WBC 12.2*  HGB 11.5*  HCT 35.1*  PLT 231  MCV 87.5  MCH 28.7  MCHC 32.8  RDW 13.7  LYMPHSABS 0.6*  MONOABS 0.8  EOSABS 0.0  BASOSABS 0.0    Recent Labs  Lab 10/05/23 1931 10/05/23 1932 10/05/23 2033 10/06/23 0217 10/06/23 0437  NA 131*  --   --   --   --   K 4.7  --   --   --   --   CL 97*  --   --   --   --   CO2 21*  --   --   --   --   GLUCOSE 519*  --   --   --   --   BUN 24*  --   --   --   --   CREATININE 1.48*  --   --   --   --   CALCIUM 9.3  --   --   --   --   AST 44*  --   --   --   --   ALT 36  --   --   --   --   ALKPHOS 60  --   --   --   --   BILITOT 0.5  --   --   --   --   ALBUMIN 3.8  --   --   --   --   MG  --  2.1  --   --   --   PROCALCITON  --  0.25  --   --   --   LATICACIDVEN  --   --  3.2* 2.2* 3.9*  INR 1.1  --   --   --   --   TSH  --   --   --   --  <0.010*  AMMONIA 26  --   --   --   --     ------------------------------------------------------------------------------------------------------------------ No results for input(s): "CHOL", "HDL", "LDLCALC", "TRIG", "CHOLHDL", "LDLDIRECT" in the last 72 hours.  Lab Results  Component Value Date   HGBA1C 8.0 (H) 08/29/2022   ------------------------------------------------------------------------------------------------------------------ Recent Labs    10/06/23 0437  TSH <0.010*    Cardiac Enzymes No results for input(s): "CKMB", "TROPONINI", "MYOGLOBIN" in the last 168  hours.  Invalid input(s): "CK" ------------------------------------------------------------------------------------------------------------------    Component Value Date/Time   BNP 48.8 03/27/2022 1000    CBG: Recent Labs  Lab 10/05/23 1913 10/05/23 2253 10/06/23 0028 10/06/23 0407  GLUCAP 433* 283* 188* 110*    No  results found for this or any previous visit (from the past 240 hours).   Radiology Studies: CT Head Wo Contrast Result Date: 10/05/2023 CLINICAL DATA:  Fall from bed with headaches and neck pain, initial encounter EXAM: CT HEAD WITHOUT CONTRAST CT CERVICAL SPINE WITHOUT CONTRAST TECHNIQUE: Multidetector CT imaging of the head and cervical spine was performed following the standard protocol without intravenous contrast. Multiplanar CT image reconstructions of the cervical spine were also generated. RADIATION DOSE REDUCTION: This exam was performed according to the departmental dose-optimization program which includes automated exposure control, adjustment of the mA and/or kV according to patient size and/or use of iterative reconstruction technique. COMPARISON:  11/13/2022 FINDINGS: CT HEAD FINDINGS Brain: No evidence of acute infarction, hemorrhage, hydrocephalus, extra-axial collection or mass lesion/mass effect. Vascular: No hyperdense vessel or unexpected calcification. Skull: Normal. Negative for fracture or focal lesion. Sinuses/Orbits: No acute finding. Other: None. CT CERVICAL SPINE FINDINGS Alignment: Within normal limits. Skull base and vertebrae: 7 cervical segments are well visualized. Vertebral body height is well maintained. Multilevel disc space narrowing and osteophytic changes are seen. Facet hypertrophic changes are noted. No acute fracture or acute facet abnormality is seen. The odontoid is within normal limits. Soft tissues and spinal canal: Findings soft tissue structures show vascular calcification. No other focal abnormality is noted. Upper chest: Visualized  lung apices are within normal limits. Other: None IMPRESSION: CT of the head: No acute intracranial abnormality noted. CT of the cervical spine: Multilevel degenerative change without acute abnormality. Electronically Signed   By: Violeta Grey M.D.   On: 10/05/2023 22:41   CT Cervical Spine Wo Contrast Result Date: 10/05/2023 CLINICAL DATA:  Fall from bed with headaches and neck pain, initial encounter EXAM: CT HEAD WITHOUT CONTRAST CT CERVICAL SPINE WITHOUT CONTRAST TECHNIQUE: Multidetector CT imaging of the head and cervical spine was performed following the standard protocol without intravenous contrast. Multiplanar CT image reconstructions of the cervical spine were also generated. RADIATION DOSE REDUCTION: This exam was performed according to the departmental dose-optimization program which includes automated exposure control, adjustment of the mA and/or kV according to patient size and/or use of iterative reconstruction technique. COMPARISON:  11/13/2022 FINDINGS: CT HEAD FINDINGS Brain: No evidence of acute infarction, hemorrhage, hydrocephalus, extra-axial collection or mass lesion/mass effect. Vascular: No hyperdense vessel or unexpected calcification. Skull: Normal. Negative for fracture or focal lesion. Sinuses/Orbits: No acute finding. Other: None. CT CERVICAL SPINE FINDINGS Alignment: Within normal limits. Skull base and vertebrae: 7 cervical segments are well visualized. Vertebral body height is well maintained. Multilevel disc space narrowing and osteophytic changes are seen. Facet hypertrophic changes are noted. No acute fracture or acute facet abnormality is seen. The odontoid is within normal limits. Soft tissues and spinal canal: Findings soft tissue structures show vascular calcification. No other focal abnormality is noted. Upper chest: Visualized lung apices are within normal limits. Other: None IMPRESSION: CT of the head: No acute intracranial abnormality noted. CT of the cervical spine:  Multilevel degenerative change without acute abnormality. Electronically Signed   By: Violeta Grey M.D.   On: 10/05/2023 22:41   DG Chest Portable 1 View Result Date: 10/05/2023 CLINICAL DATA:  Overdose.  Hypoxia. EXAM: PORTABLE CHEST 1 VIEW COMPARISON:  08/29/2022 FINDINGS: The cardiac silhouette, mediastinal and hilar contours are. The lungs are clear. No infiltrates or effusions. No pulmonary edema. No pneumothorax. The bony thorax is intact. 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 IMPRESSION: 1. No acute cardiopulmonary findings.  2. Erosive arthropathy involving both shoulders. Electronically Signed   By: Marrian Siva M.D.   On: 10/05/2023 21:45   Kathlen Para, MD, PhD [facility]  Between 7 am - 7 pm I am available, please contact me via Amion (for emergencies) or Securechat (non urgent messages)  Between 7 pm - 7 am I am not available, please contact night coverage MD/APP via Amion

## 2023-10-06 NOTE — Assessment & Plan Note (Signed)
 -  Check TSH continue home medications Synthroid at  125 mcg po q day

## 2023-10-06 NOTE — Assessment & Plan Note (Signed)
 In the setting of cirrhosis may not be able to clear well. Rehydrate and follow

## 2023-10-06 NOTE — H&P (Signed)
 Taylor Benitez:[id number] DOB: [birth year] DOA: 10/05/2023     PCP: Judy Pimple, MD      Patient arrived to ER on 10/05/23 at 1803 Referred by Attending Alvira Monday, MD   Patient coming from:    home Lives   With roomate     Chief Complaint:   Chief Complaint  Patient presents with   Drug Overdose    HPI: Taylor Benitez is a 63 y.o. 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, cirrhosis, history of polysubstance     Presented with found down  Found down next to her bed she only admits to [facility]  But was found sleepy and hypoxic  Did not Need repeat Narcan doses as of yet Lactic acid in 3.4 CXR non acute  She is arousable 84% on RA CBG 569 Now 284 ET CO2 90 initially  Reports she took some Klonapin as she usually does  And smoked some pot But states her roommate used some purple crystal stuff She brushed it off into the sink with her bear hand  She feels like that is what she was exposed to  States her roommate usees heroin and fentanyl   Pt responded well again to narcan in ER   Reports she has not been taking her oxy code ever since she had her knees and hips repaired  She reprots she ate a lot of donuts last night   Denies significant ETOH intake  occasional drinker Does not smoke uses vape Reports THC Lab Results  Component Value Date   SARSCOV2NAA NEGATIVE 10/29/2019   SARSCOV2NAA reactive 10/21/2019        Regarding pertinent Chronic problems:    Hyperlipidemia - on statins Lipitor (atorvastatin) non complaint does not tolerate Lipid Panel     Component Value Date/Time   CHOL 142 08/31/2022 0412   TRIG 258 (H) 08/31/2022 0412   HDL 28 (L) 08/31/2022 0412   CHOLHDL 5.1 08/31/2022 0412   VLDL 52 (H) 08/31/2022 0412   LDLCALC 62 08/31/2022 0412       DM 2 -  Lab Results  Component Value Date   HGBA1C 8.0 (H) 08/29/2022   PO meds only, jiardiance nolonger on ozempic    Hypothyroidism:   Lab Results  Component Value Date   TSH 0.034 (L) 08/29/2022   on synthroid      CKD stage IIIa baseline Cr 1.0 Estimated Creatinine Clearance: 34.8 mL/min (A) (by C-G formula based on SCr of 1.48 mg/dL (H)).  Lab Results  Component Value Date   CREATININE 1.48 (H) 10/05/2023   CREATININE 1.05 (H) 11/13/2022   CREATININE 0.91 09/03/2022   Lab Results  Component Value Date   NA 131 (L) 10/05/2023   CL 97 (L) 10/05/2023   K 4.7 10/05/2023   CO2 21 (L) 10/05/2023   BUN 24 (H) 10/05/2023   CREATININE 1.48 (H) 10/05/2023   GFRNONAA 40 (L) 10/05/2023   CALCIUM 9.3 10/05/2023   PHOS 3.8 08/31/2022   ALBUMIN 3.8 10/05/2023   GLUCOSE 519 (HH) 10/05/2023     Liver disease MELD 3.0: 17 at 10/05/2023  7:31 PM MELD-Na: 11 at 10/05/2023  7:31 PM    Hepatic Function Panel     Component Value Date/Time   PROT 7.0 10/05/2023 1931   ALBUMIN 3.8 10/05/2023 1931   AST 44 (H) 10/05/2023 1931   ALT 36 10/05/2023 1931   ALKPHOS 60 10/05/2023 1931   BILITOT 0.5 10/05/2023  1931   BILIDIR 0.8 (H) 01/27/2009 0126   IBILI 1.7 (H) 01/27/2009 0126   1.1  Chronic anemia - baseline hg Hemoglobin & Hematocrit  Recent Labs    11/13/22 1717 10/05/23 1931  HGB 11.8* 11.5*   Iron/TIBC/Ferritin/ %Sat    Component Value Date/Time   IRON 42 01/29/2009 1425   TIBC 211 (L) 01/29/2009 1425   FERRITIN 865 (H) 01/29/2009 1425   IRONPCTSAT 20 01/29/2009 1425      While in ER:     UTOX with benzo and THC    Lab Orders         Urinalysis, Routine w reflex microscopic -Urine, Clean Catch         Rapid urine drug screen (hospital performed)         CK         CBC with Differential         Comprehensive metabolic panel         Lactic acid, plasma         Acetaminophen level         Ethanol         Protime-INR         Ammonia         CBG monitoring, ED         CBG monitoring, ED      CT HEAD  NON acute   CXR - NON acute Following Medications were ordered in  ER: Medications  lactated ringers bolus 1,000 mL (1,000 mLs Intravenous New Bag/Given 10/05/23 2301)  insulin aspart (novoLOG) injection 6 Units (6 Units Intravenous Given 10/05/23 2312)        ED Triage Vitals  Encounter Vitals Group     BP 10/05/23 1900 126/61     Systolic BP Percentile --      Diastolic BP Percentile --      Pulse Rate 10/05/23 1900 94     Resp 10/05/23 1900 11     Temp 10/05/23 2315 (!) 97.1 F (36.2 C)     Temp Source 10/05/23 2315 Axillary     SpO2 10/05/23 1900 94 %     Weight 10/05/23 1835 154 lb (69.9 kg)     Height 10/05/23 1835 5\' 1"  (1.549 m)     Head Circumference --      Peak Flow --      Pain Score 10/05/23 1834 7     Pain Loc --      Pain Education --      Exclude from Growth Chart --   ZOXW(96)@     _________________________________________ Significant initial  Findings: Abnormal Labs Reviewed  URINALYSIS, ROUTINE W REFLEX MICROSCOPIC - Abnormal; Notable for the following components:      Result Value   Glucose, UA >=500 (*)    Hgb urine dipstick MODERATE (*)    Protein, ur 30 (*)    Leukocytes,Ua TRACE (*)    Bacteria, UA RARE (*)    All other components within normal limits  RAPID URINE DRUG SCREEN, HOSP PERFORMED - Abnormal; Notable for the following components:   Benzodiazepines POSITIVE (*)    Tetrahydrocannabinol POSITIVE (*)    All other components within normal limits  CK - Abnormal; Notable for the following components:   Total CK 837 (*)    All other components within normal limits  CBC WITH DIFFERENTIAL/PLATELET - Abnormal; Notable for the following components:   WBC 12.2 (*)    Hemoglobin 11.5 (*)  HCT 35.1 (*)    Neutro Abs 10.6 (*)    Lymphs Abs 0.6 (*)    Abs Immature Granulocytes 0.14 (*)    All other components within normal limits  COMPREHENSIVE METABOLIC PANEL WITH GFR - Abnormal; Notable for the following components:   Sodium 131 (*)    Chloride 97 (*)    CO2 21 (*)    Glucose, Bld 519 (*)    BUN 24 (*)     Creatinine, Ser 1.48 (*)    AST 44 (*)    GFR, Estimated 40 (*)    All other components within normal limits  LACTIC ACID, PLASMA - Abnormal; Notable for the following components:   Lactic Acid, Venous 3.2 (*)    All other components within normal limits  ACETAMINOPHEN LEVEL - Abnormal; Notable for the following components:   Acetaminophen (Tylenol), Serum <10 (*)    All other components within normal limits  CBG MONITORING, ED - Abnormal; Notable for the following components:   Glucose-Capillary 433 (*)    All other components within normal limits  CBG MONITORING, ED - Abnormal; Notable for the following components:   Glucose-Capillary 283 (*)    All other components within normal limits     Cardiac Panel (last 3 results) Recent Labs    10/05/23 1932  CKTOTAL 837*     ECG: Ordered Personally reviewed and interpreted by me showing: HR : 88 Rhythm: Sinus rhythm Borderline left axis deviation QTC 429       The recent clinical data is shown below. Vitals:   10/05/23 2236 10/05/23 2245 10/05/23 2300 10/05/23 2315  BP: 123/74 (!) 143/52  (!) 87/63  Pulse:  86 86 82  Resp:  16 12 11   Temp:    (!) 97.1 F (36.2 C)  TempSrc:    Axillary  SpO2:  94% (!) 86% 95%  Weight:      Height:         WBC     Component Value Date/Time   WBC 12.2 (H) 10/05/2023 1931   LYMPHSABS 0.6 (L) 10/05/2023 1931   MONOABS 0.8 10/05/2023 1931   EOSABS 0.0 10/05/2023 1931   BASOSABS 0.0 10/05/2023 1931    Lactic Acid, Venous    Component Value Date/Time   LATICACIDVEN 3.2 (HH) 10/05/2023 2033      Procalcitonin   Ordered      UA   no evidence of UTI     Urine analysis:    Component Value Date/Time   COLORURINE YELLOW 10/05/2023 1839   APPEARANCEUR CLEAR 10/05/2023 1839   LABSPEC 1.013 10/05/2023 1839   PHURINE 6.0 10/05/2023 1839   GLUCOSEU >=500 (A) 10/05/2023 1839   HGBUR MODERATE (A) 10/05/2023 1839   BILIRUBINUR NEGATIVE 10/05/2023 1839   KETONESUR NEGATIVE  10/05/2023 1839   PROTEINUR 30 (A) 10/05/2023 1839   UROBILINOGEN 1.0 01/29/2009 0533   NITRITE NEGATIVE 10/05/2023 1839   LEUKOCYTESUR TRACE (A) 10/05/2023 1839    Results for orders placed or performed during the hospital encounter of 11/13/22  Urine Culture     Status: Abnormal   Collection Time: 11/13/22  9:54 PM   Specimen: Urine, Clean Catch  Result Value Ref Range Status   Specimen Description   Final    URINE, CLEAN CATCH Performed at [facility], [address] [place], [place] [address]    Special Requests   Final    NONE Performed at [facility], [address] [place], [place] [address]  Culture MULTIPLE SPECIES PRESENT, SUGGEST RECOLLECTION (A)  Final   Report Status 11/15/2022 FINAL  Final      __________________________________________________________ Recent Labs  Lab 10/05/23 1931  NA 131*  K 4.7  CO2 21*  GLUCOSE 519*  BUN 24*  CREATININE 1.48*  CALCIUM 9.3    Cr   Up from baseline see below Lab Results  Component Value Date   CREATININE 1.48 (H) 10/05/2023   CREATININE 1.05 (H) 11/13/2022   CREATININE 0.91 09/03/2022    Recent Labs  Lab 10/05/23 1931  AST 44*  ALT 36  ALKPHOS 60  BILITOT 0.5  PROT 7.0  ALBUMIN 3.8   Lab Results  Component Value Date   CALCIUM 9.3 10/05/2023   PHOS 3.8 08/31/2022    Plt: Lab Results  Component Value Date   PLT 231 10/05/2023       Recent Labs  Lab 10/05/23 1931  WBC 12.2*  NEUTROABS 10.6*  HGB 11.5*  HCT 35.1*  MCV 87.5  PLT 231    HG/HCT  stable,     Component Value Date/Time   HGB 11.5 (L) 10/05/2023 1931   HCT 35.1 (L) 10/05/2023 1931   MCV 87.5 10/05/2023 1931     No results for input(s): "LIPASE", "AMYLASE" in the last 168 hours. Recent Labs  Lab 10/05/23 1931  AMMONIA 26   _____________________________________ Hospitalist was called for admission for overdose     The following Work up has been ordered so far:  Orders  Placed This Encounter  Procedures   CT Head Wo Contrast   CT Cervical Spine Wo Contrast   DG Chest Portable 1 View   Urinalysis, Routine w reflex microscopic -Urine, Clean Catch   Rapid urine drug screen (hospital performed)   CK   CBC with Differential   Comprehensive metabolic panel   Lactic acid, plasma   Acetaminophen level   Ethanol   Protime-INR   Ammonia   Document Height and Actual Weight   Vital signs   Check temperature   ED Cardiac monitoring   Consult to hospitalist   End-tidal CO2   CBG monitoring, ED   CBG monitoring, ED   ED EKG   ED EKG   EKG 12-Lead     OTHER Significant initial  Findings:  labs showing:     DM  labs:  HbA1C: No results for input(s): "HGBA1C" in the last 8760 hours.     CBG (last 3)  Recent Labs    10/05/23 1913 10/05/23 2253  GLUCAP 433* 283*          Cultures:    Component Value Date/Time   SDES  11/13/2022 2154    URINE, CLEAN CATCH Performed at [facility], [address], [place], [place] [address]    [facility]  11/13/2022 2154    NONE Performed at [facility], [address] [place], [place] [address]    CULT MULTIPLE SPECIES PRESENT, SUGGEST RECOLLECTION (A) 11/13/2022 2154   REPTSTATUS 11/15/2022 FINAL 11/13/2022 2154     Radiological Exams on Admission: CT Head Wo Contrast Result Date: 10/05/2023 CLINICAL DATA:  Fall from bed with headaches and neck pain, initial encounter EXAM: CT HEAD WITHOUT CONTRAST CT CERVICAL SPINE WITHOUT CONTRAST TECHNIQUE: Multidetector CT imaging of the head and cervical spine was performed following the standard protocol without intravenous contrast. Multiplanar CT image reconstructions of the cervical spine were also generated. RADIATION DOSE REDUCTION: This exam was performed according to the departmental dose-optimization program which includes automated  exposure control, adjustment of the mA and/or kV according to patient size and/or  use of iterative reconstruction technique. COMPARISON:  11/13/2022 FINDINGS: CT HEAD FINDINGS Brain: No evidence of acute infarction, hemorrhage, hydrocephalus, extra-axial collection or mass lesion/mass effect. Vascular: No hyperdense vessel or unexpected calcification. Skull: Normal. Negative for fracture or focal lesion. Sinuses/Orbits: No acute finding. Other: None. CT CERVICAL SPINE FINDINGS Alignment: Within normal limits. Skull base and vertebrae: 7 cervical segments are well visualized. Vertebral body height is well maintained. Multilevel disc space narrowing and osteophytic changes are seen. Facet hypertrophic changes are noted. No acute fracture or acute facet abnormality is seen. The odontoid is within normal limits. Soft tissues and spinal canal: Findings soft tissue structures show vascular calcification. No other focal abnormality is noted. Upper chest: Visualized lung apices are within normal limits. Other: None IMPRESSION: CT of the head: No acute intracranial abnormality noted. CT of the cervical spine: Multilevel degenerative change without acute abnormality. Electronically Signed   By: Alcide Clever M.D.   On: 10/05/2023 22:41   CT Cervical Spine Wo Contrast Result Date: 10/05/2023 CLINICAL DATA:  Fall from bed with headaches and neck pain, initial encounter EXAM: CT HEAD WITHOUT CONTRAST CT CERVICAL SPINE WITHOUT CONTRAST TECHNIQUE: Multidetector CT imaging of the head and cervical spine was performed following the standard protocol without intravenous contrast. Multiplanar CT image reconstructions of the cervical spine were also generated. RADIATION DOSE REDUCTION: This exam was performed according to the departmental dose-optimization program which includes automated exposure control, adjustment of the mA and/or kV according to patient size and/or use of iterative reconstruction technique. COMPARISON:  11/13/2022 FINDINGS: CT HEAD FINDINGS Brain: No evidence of acute infarction, hemorrhage,  hydrocephalus, extra-axial collection or mass lesion/mass effect. Vascular: No hyperdense vessel or unexpected calcification. Skull: Normal. Negative for fracture or focal lesion. Sinuses/Orbits: No acute finding. Other: None. CT CERVICAL SPINE FINDINGS Alignment: Within normal limits. Skull base and vertebrae: 7 cervical segments are well visualized. Vertebral body height is well maintained. Multilevel disc space narrowing and osteophytic changes are seen. Facet hypertrophic changes are noted. No acute fracture or acute facet abnormality is seen. The odontoid is within normal limits. Soft tissues and spinal canal: Findings soft tissue structures show vascular calcification. No other focal abnormality is noted. Upper chest: Visualized lung apices are within normal limits. Other: None IMPRESSION: CT of the head: No acute intracranial abnormality noted. CT of the cervical spine: Multilevel degenerative change without acute abnormality. Electronically Signed   By: Alcide Clever M.D.   On: 10/05/2023 22:41   DG Chest Portable 1 View Result Date: 10/05/2023 CLINICAL DATA:  Overdose.  Hypoxia. EXAM: PORTABLE CHEST 1 VIEW COMPARISON:  08/29/2022 FINDINGS: The cardiac silhouette, mediastinal and hilar contours are. The lungs are clear. No infiltrates or effusions. No pulmonary edema. No pneumothorax. The bony thorax is intact. 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 IMPRESSION: 1. No acute cardiopulmonary findings. 2. Erosive arthropathy involving both shoulders. Electronically Signed   By: Rudie Meyer M.D.   On: 10/05/2023 21:45   _______________________________________________________________________________________________________ Latest  Blood pressure (!) 87/63, pulse 82, temperature (!) 97.1 F (36.2 C), temperature source Axillary, resp. rate 11, height 5\' 1"  (1.549 m), weight 69.9 kg, SpO2 95%.   Vitals  labs and  radiology finding personally reviewed  Review of Systems:    Pertinent positives include:  chills, fatigue found down   Constitutional:  No weight loss, night sweats, Fevers, , weight loss  HEENT:  No headaches, Difficulty swallowing,Tooth/dental problems,Sore throat,  No sneezing, itching, ear ache, nasal congestion, post nasal drip,  Cardio-vascular:  No chest pain, Orthopnea, PND, anasarca, dizziness, palpitations.no Bilateral lower extremity swelling  GI:  No heartburn, indigestion, abdominal pain, nausea, vomiting, diarrhea, change in bowel habits, loss of appetite, melena, blood in stool, hematemesis Resp:  no shortness of breath at rest. No dyspnea on exertion, No excess mucus, no productive cough, No non-productive cough, No coughing up of blood.No change in color of mucus.No wheezing. Skin:  no rash or lesions. No jaundice GU:  no dysuria, change in color of urine, no urgency or frequency. No straining to urinate.  No flank pain.  Musculoskeletal:  No joint pain or no joint swelling. No decreased range of motion. No back pain.  Psych:  No change in mood or affect. No depression or anxiety. No memory loss.  Neuro: no localizing neurological complaints, no tingling, no weakness, no double vision, no gait abnormality, no slurred speech, no confusion  All systems reviewed and apart from HOPI all are negative _______________________________________________________________________________________________ Past Medical History:   Past Medical History:  Diagnosis Date   Chronic narcotic use    Chronic pain    Cirrhosis of liver (HCC) 2010   followed by pcp  (07-30-2021  pt stated was told this many yrs ago but has no symptoms,  normal enzymes 07-02-2021 lab in epic)   Cocaine abuse (HCC)    (07-30-2021  per pt average cocaine monthly, stated had not used since positive UDS 07-02-2021)   Full dentures    History of diverticulitis of colon 2006   s/p  hemicolectomy with  colostomy for perferated sigmoid colon , takedown in [address]   History of drug overdose 2017   accidental narcotic   History of gastric ulcer 2010   History of panic attacks    History of small bowel obstruction 11/19/2016   s/p  exploratory laparotomy   Hypertension    Hypothyroidism    followed by pcp   Marijuana abuse    (07-30-2021  pt stated smokes weekly)   OA (osteoarthritis)    Osteomyelitis of great toe of left foot (HCC)    Peripheral neuropathy    hands and feet   Seronegative rheumatoid arthritis (HCC)    Type 2 diabetes mellitus (HCC)    followed by pcp   (07-30-2021  pt stated does not check blood sugar at home)      Past Surgical History:  Procedure Laterality Date   AMPUTATION TOE Left 08/08/2021   Procedure: AMPUTATION LEFT FIRST TOE;  Surgeon: Kieth Brightly, DPM;  Location: [facility];  Service: Podiatry;  Laterality: Left;  WITH ANKLE BLOCK   BREAST REDUCTION SURGERY Bilateral 1989   COLON SURGERY  2006   hemicolecotmy w/ colostomy;   takedown done in 2007   (@[facility])   ESOPHAGOGASTRODUODENOSCOPY  01/30/2009   by dr Christella Hartigan   EXPLORATORY LAPAROTOMY  11/19/2016   @[facility];  exploratory celiotomy  for SBO   TONSILLECTOMY     child   TOTAL HIP ARTHROPLASTY Right 2019   TOTAL HIP ARTHROPLASTY Left 11/02/2019   Procedure: TOTAL HIP ARTHROPLASTY ANTERIOR APPROACH;  Surgeon: Ollen Gross, MD;  Location: [facility];  Service: Orthopedics;  Laterality: Left;    TOTAL KNEE ARTHROPLASTY Bilateral    left 11-06-2016;  right   TOTAL VAGINAL HYSTERECTOMY Bilateral 2005   per pt w/ bilateral salpingoophorectomy    Social History:  Ambulatory   independently  reports that she quit smoking about 5 years ago. Her smoking use included cigarettes. She started smoking about 44 years ago. She has never used smokeless tobacco. She reports that she does not currently use alcohol. She reports current drug use. Drugs: Marijuana, Cocaine, and  Benzodiazepines.    Family History:   Family History  Problem Relation Age of Onset   Arthritis Mother    Depression Mother    Diabetes Mother    Hearing loss Mother    Stroke Mother    Cancer Father    Diabetes Father    Heart attack Father    Heart disease Father    Stroke Father    Depression Sister    Drug abuse Sister    Depression Brother    Arthritis Maternal Grandmother    Cancer Maternal Grandfather    COPD Maternal Grandfather    Depression Maternal Grandfather    Depression Paternal Grandmother    Hyperlipidemia Paternal Grandmother    Heart disease Paternal Grandmother    ______________________________________________________________________________________________ Allergies: Allergies  Allergen Reactions   Glipizide     GI side eff   Penicillins Rash    Tolerated Cephalosporin Date: 11/04/19.      Prior to Admission medications   Medication Sig Start Date End Date Taking? Authorizing Provider  acetaminophen (TYLENOL) 325 MG tablet Take 2 tablets (650 mg total) by mouth every 4 (four) hours as needed for mild pain (or temp > 37.5 C (99.5 F)). 09/04/22   Hollice Espy, MD  amitriptyline (ELAVIL) 100 MG tablet Take 1 tablet (100 mg total) by mouth at bedtime. 09/04/22   Hollice Espy, MD  atorvastatin (LIPITOR) 10 MG tablet Take 1 tablet (10 mg total) by mouth daily. 09/04/22 09/04/23  Hollice Espy, MD  cephALEXin (KEFLEX) 500 MG capsule Take 1 capsule (500 mg total) by mouth 4 (four) times daily. 11/13/22   Smoot, Shawn Route, PA-C  empagliflozin (JARDIANCE) 25 MG TABS tablet Take 25 mg by mouth daily.    [provider]  escitalopram (LEXAPRO) 20 MG tablet Take 20 mg by mouth daily. 03/17/22   [provider]  folic acid (FOLVITE) 1 MG tablet Take 1 tablet (1 mg total) by mouth daily. 09/05/22   Hollice Espy, MD  levothyroxine (SYNTHROID) 125 MCG tablet Take 1 tablet (125 mcg total) by mouth daily at 6 (six) AM. 09/05/22    Hollice Espy, MD  lidocaine (LIDODERM) 5 % Place 1 patch onto the skin daily. Remove & Discard patch within 12 hours or as directed by MD 09/04/22   Hollice Espy, MD  metFORMIN (GLUMETZA) 1000 MG (MOD) 24 hr tablet Take 1,000 mg by mouth 2 (two) times daily with a meal.    [provider]  oxyCODONE (OXY IR/ROXICODONE) 5 MG immediate release tablet Take 1 tablet (5 mg total) by mouth 3 (three) times daily as needed for severe pain. 09/04/22   Hollice Espy, MD  Semaglutide (OZEMPIC, 0.25 OR 0.5 MG/DOSE, [place]) Inject 0.25 mg into the skin once a week. Mondays    [provider]    ___________________________________________________________________________________________________ Physical Exam:    10/05/2023   11:15 PM 10/05/2023   11:00 PM 10/05/2023   10:45 PM  Vitals with BMI  Systolic 87  143  Diastolic 63  52  Pulse 82 86 86     1. General:  in No  Acute distress    Chronically ill  -appearing 2. Psychological: Alert and  Oriented 3. Head/ENT:    Dry Mucous Membranes                          Head Non traumatic, neck supple                        Poor Dentition 4. SKIN:  decreased Skin turgor,  Skin clean Dry and intact no rash    5. Heart: Regular rate and rhythm no  Murmur, no Rub or gallop 6. Lungs no wheezes or crackles   7. Abdomen: Soft,  non-tender, Non distended   obese  bowel sounds present 8. Lower extremities: no clubbing, cyanosis, no  edema 9. Neurologically Grossly intact, moving all 4 extremities equally   10. MSK: Normal range of motion    Chart has been reviewed  ______________________________________________________________________________________________  Assessment/Plan  63 y.o. 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, cirrhosis, history of polysubstance    Admitted for overdose   Present on Admission:  Overdose, accidental or unintentional, initial encounter  Acute  metabolic encephalopathy  Essential hypertension  Other cirrhosis of liver (HCC)  Thyroid disease  Type 2 diabetes mellitus with hyperglycemia (HCC)  Hyponatremia  Lactic acidosis  Rhabdomyolysis     Acute metabolic encephalopathy In the setting of polysubstance overdose.  Response to Narcan X2 Potentially benzodiazepines on board.  Patient mental status completely improved with Narcan continue to monitor in stepdown may need repeat Narcan doses  Essential hypertension Allow permissive hypertension  Other cirrhosis of liver (HCC) May explain hyponatremia and significantly elevated lactic acid. Continue to monitor   Overdose, accidental or unintentional, initial encounter Likely accidental overdose with narcotics.  Patient states her roommate has been spreading substances around her place of living Denies any intentional use Responds well to Narcan Given the benzodiazepines on board may take a bit longer to recover administer Narcan as needed admit to stepdown  Thyroid disease - Check TSH continue home medications Synthroid at po q day   Type 2 diabetes mellitus with hyperglycemia (HCC) Initially hyperglycemic patient states she has eaten large amount of donuts prior to taking a nap Order sliding scale  Hold p.o. medications  Hyponatremia In the setting of dehydration decreased p.o. intake and possible cirrhosis.  Obtain electrolytes rehydrate and follow  Lactic acidosis In the setting of cirrhosis may not be able to clear well. Rehydrate and follow  Rhabdomyolysis Nontraumatic in the setting of being found down.  Follow CK Rehydrate   Other plan as per orders.  DVT prophylaxis:  SCD      Code Status:    Code Status: Prior FULL CODE   as per patient   I had personally discussed CODE STATUS with patient   ACP   none   Family Communication:   Family not at  Bedside     Diet  diabetic diet   Disposition Plan:        To home once workup is complete  and patient is stable   Following barriers for discharge:                                                        Electrolytes corrected  Anemia corrected h/H stable                                Consult Orders  (From admission, onward)           Start     Ordered   10/05/23 2319  Consult to hospitalist  Once       Provider:  (Not yet assigned)  Question Answer Comment  Place call to: [facility]   Reason for Consult Admit      10/05/23 2319                              Transition of care consulted                                      Consults called: none   Admission status:  ED Disposition     ED Disposition  Admit   Condition  --   Comment  Hospital Area: [facility] [[id number]]  Level of Care: Stepdown [14]  Admit to SDU based on following criteria: Hemodynamic compromise or significant risk of instability:  Patient requiring short term acute titration and management of vasoactive drips, and invasive monitoring (i.e., CVP and Arterial line).  Admit to SDU based on following criteria: Severe physiological/psychological symptoms:  Any diagnosis requiring assessment & intervention at least every 4 hours on an ongoing basis to obtain desired patient outcomes including stability and rehabilitation  May place patient in observation at [facility] or [facility] if equivalent level of care is available:: No  Covid Evaluation: Asymptomatic - no recent exposure (last 10 days) testing not required  Diagnosis: Overdose, accidental or unintentional, initial encounter [[id number]]  Admitting Physician: Alfio Loescher [[id number]]  Attending Physician: Dinesha Twiggs [[id number]]           Obs     Level of care    stepdown   tele indefinitely please discontinue once patient no longer qualifies COVID-19 Labs    Lab Results  Component Value Date   SARSCOV2NAA NEGATIVE 10/29/2019     Saia Derossett 10/06/2023,  1:29 AM    [facility]     after 2 AM please page floor coverage PA If 7AM-7PM, please contact the day team taking care of the patient using [email or web address]

## 2023-10-06 NOTE — Assessment & Plan Note (Signed)
 In the setting of polysubstance overdose.  Response to Narcan X2 Potentially benzodiazepines on board.  Patient mental status completely improved with Narcan continue to monitor in stepdown may need repeat Narcan doses

## 2023-10-06 NOTE — Assessment & Plan Note (Signed)
 May explain hyponatremia and significantly elevated lactic acid. Continue to monitor

## 2023-10-06 NOTE — Assessment & Plan Note (Signed)
 Allow permissive hypertension

## 2023-10-06 NOTE — Assessment & Plan Note (Signed)
 In the setting of dehydration decreased p.o. intake and possible cirrhosis.  Obtain electrolytes rehydrate and follow

## 2023-10-06 NOTE — Assessment & Plan Note (Addendum)
 Initially hyperglycemic patient states she has eaten large amount of donuts prior to taking a nap Order sliding scale  Hold p.o. medications

## 2023-10-06 NOTE — ED Notes (Signed)
 ED TO INPATIENT HANDOFF REPORT  Name/Age/Gender Taylor Benitez 63 y.o. female  Code Status    Code Status Orders  (From admission, onward)           Start     Ordered   10/06/23 0105  Full code  Continuous       Question:  By:  Answer:  Consent: discussion documented in EHR   10/06/23 0105           Code Status History     Date Active Date Inactive Code Status Order ID Comments User Context   08/30/2022 0715 09/04/2022 1957 Full Code [id number]  Corrinne Din, MD ED   08/30/2022 0626 08/30/2022 0715 Full Code [id number]  Corrinne Din, MD ED   11/02/2019 1647 11/04/2019 1750 Full Code [id number]  Dolphus Friday, PA Inpatient   12/09/2015 0804 12/10/2015 2102 Full Code [id number]  Rosi Converse, PA-C ED   12/09/2015 0622 12/09/2015 0804 Full Code [id number]  Deatra Face, MD ED       Home/SNF/Other Home  Chief Complaint Overdose, accidental or unintentional, initial encounter [T50.901A] Rhabdomyolysis [M62.82]  Level of Care/Admitting Diagnosis ED Disposition     ED Disposition  Admit   Condition  --   Comment  Hospital Area: [facility] [[id number]]  Level of Care: Progressive [102]  Admit to Progressive based on following criteria: MULTISYSTEM THREATS such as stable sepsis, metabolic/electrolyte imbalance with or without encephalopathy that is responding to early treatment.  May place patient in observation at [facility] or [facility] if equivalent level of care is available:: No  Covid Evaluation: Asymptomatic - no recent exposure (last 10 days) testing not required  Diagnosis: Rhabdomyolysis [728.88.ICD-9-CM]  Admitting Physician: GHERGHE, COSTIN M [[id number]]  Attending Physician: GHERGHE, COSTIN M [phone]          Medical History Past Medical History:  Diagnosis Date   Chronic narcotic use    Chronic pain    Cirrhosis of liver (HCC) 2010   followed by pcp  (07-30-2021  pt stated was told this many yrs ago but has no symptoms,  normal  enzymes 07-02-2021 lab in epic)   Cocaine abuse (HCC)    (07-30-2021  per pt average cocaine monthly, stated had not used since positive UDS 07-02-2021)   Full dentures    History of diverticulitis of colon 2006   s/p  hemicolectomy with colostomy for perferated sigmoid colon , takedown in [id number]   History of drug overdose 2017   accidental narcotic   History of gastric ulcer 2010   History of panic attacks    History of small bowel obstruction 11/19/2016   s/p  exploratory laparotomy   Hypertension    Hypothyroidism    followed by pcp   Marijuana abuse    (07-30-2021  pt stated smokes weekly)   OA (osteoarthritis)    Osteomyelitis of great toe of left foot (HCC)    Peripheral neuropathy    hands and feet   Seronegative rheumatoid arthritis (HCC)    Type 2 diabetes mellitus (HCC)    followed by pcp   (07-30-2021  pt stated does not check blood sugar at home)    Allergies Allergies  Allergen Reactions   Glipizide     GI side eff   Penicillins Rash    Tolerated Cephalosporin Date: 11/04/19.     IV Location/Drains/Wounds Patient Lines/Drains/Airways Status     Active Line/Drains/Airways     Name Placement date Placement time Site Days  Peripheral IV 10/05/23 20 G Posterior;Right Hand 10/05/23  1914  Hand  1   Peripheral IV 10/06/23 20 G Anterior;Left;Proximal Forearm 10/06/23  0000  Forearm  less than 1            Labs/Imaging Results for orders placed or performed during the hospital encounter of 10/05/23 (from the past 48 hours)  Urinalysis, Routine w reflex microscopic -Urine, Clean Catch     Status: Abnormal   Collection Time: 10/05/23  6:39 PM  Result Value Ref Range   Color, Urine YELLOW YELLOW   APPearance CLEAR CLEAR   Specific Gravity, Urine 1.013 1.005 - 1.030   pH 6.0 5.0 - 8.0   Glucose, UA >=500 (A) NEGATIVE mg/dL   Hgb urine dipstick MODERATE (A) NEGATIVE   Bilirubin Urine NEGATIVE NEGATIVE   Ketones, ur NEGATIVE NEGATIVE mg/dL   Protein, ur  30 (A) NEGATIVE mg/dL   Nitrite NEGATIVE NEGATIVE   Leukocytes,Ua TRACE (A) NEGATIVE   RBC / HPF 0-5 0 - 5 RBC/hpf   WBC, UA 0-5 0 - 5 WBC/hpf   Bacteria, UA RARE (A) NONE SEEN   Squamous Epithelial / HPF 0-5 0 - 5 /HPF   Hyaline Casts, UA PRESENT     Comment: Performed at [facility], [address] [place], [place] [address]  Rapid urine drug screen (hospital performed)     Status: Abnormal   Collection Time: 10/05/23  6:40 PM  Result Value Ref Range   Opiates NONE DETECTED NONE DETECTED   Cocaine NONE DETECTED NONE DETECTED   Benzodiazepines POSITIVE (A) NONE DETECTED   Amphetamines NONE DETECTED NONE DETECTED   Tetrahydrocannabinol POSITIVE (A) NONE DETECTED   Barbiturates NONE DETECTED NONE DETECTED    Comment: (NOTE) DRUG SCREEN FOR MEDICAL PURPOSES ONLY.  IF CONFIRMATION IS NEEDED FOR ANY PURPOSE, NOTIFY LAB WITHIN 5 DAYS.  LOWEST DETECTABLE LIMITS FOR URINE DRUG SCREEN Drug Class                     Cutoff (ng/mL) Amphetamine and metabolites    1000 Barbiturate and metabolites    200 Benzodiazepine                 200 Opiates and metabolites        300 Cocaine and metabolites        300 THC                            50 Performed at [facility], [address], [place], [place] [address]   CBG monitoring, ED     Status: Abnormal   Collection Time: 10/05/23  7:13 PM  Result Value Ref Range   Glucose-Capillary 433 (H) 70 - 99 mg/dL    Comment: Glucose reference range applies only to samples taken after fasting for at least 8 hours.  CBC with Differential     Status: Abnormal   Collection Time: 10/05/23  7:31 PM  Result Value Ref Range   WBC 12.2 (H) 4.0 - 10.5 K/uL   RBC 4.01 3.87 - 5.11 MIL/uL   Hemoglobin 11.5 (L) 12.0 - 15.0 g/dL   HCT 69.6 (L) 29.5 - 28.4 %   MCV 87.5 80.0 - 100.0 fL   MCH 28.7 26.0 - 34.0 pg   MCHC 32.8 30.0 - 36.0 g/dL   RDW 13.2 44.0 - 10.2 %   Platelets 231 150 - 400 K/uL   nRBC  0.0 0.0 - 0.2  %   Neutrophils Relative % 87 %   Neutro Abs 10.6 (H) 1.7 - 7.7 K/uL   Lymphocytes Relative 5 %   Lymphs Abs 0.6 (L) 0.7 - 4.0 K/uL   Monocytes Relative 7 %   Monocytes Absolute 0.8 0.1 - 1.0 K/uL   Eosinophils Relative 0 %   Eosinophils Absolute 0.0 0.0 - 0.5 K/uL   Basophils Relative 0 %   Basophils Absolute 0.0 0.0 - 0.1 K/uL   Immature Granulocytes 1 %   Abs Immature Granulocytes 0.14 (H) 0.00 - 0.07 K/uL    Comment: Performed at [facility], [address] [place], [place] [address]  Comprehensive metabolic panel     Status: Abnormal   Collection Time: 10/05/23  7:31 PM  Result Value Ref Range   Sodium 131 (L) 135 - 145 mmol/L   Potassium 4.7 3.5 - 5.1 mmol/L   Chloride 97 (L) 98 - 111 mmol/L   CO2 21 (L) 22 - 32 mmol/L   Glucose, Bld 519 (HH) 70 - 99 mg/dL    Comment: CRITICAL RESULT CALLED TO, READ BACK BY AND VERIFIED WITH BROWN, A RN @ 2042 ON 10/05/2023 BY MTA Glucose reference range applies only to samples taken after fasting for at least 8 hours.    BUN 24 (H) 8 - 23 mg/dL   Creatinine, Ser 7.82 (H) 0.44 - 1.00 mg/dL   Calcium 9.3 8.9 - 95.6 mg/dL   Total Protein 7.0 6.5 - 8.1 g/dL   Albumin 3.8 3.5 - 5.0 g/dL   AST 44 (H) 15 - 41 U/L   ALT 36 0 - 44 U/L   Alkaline Phosphatase 60 38 - 126 U/L   Total Bilirubin 0.5 0.0 - 1.2 mg/dL   GFR, Estimated 40 (L) >60 mL/min    Comment: (NOTE) Calculated using the CKD-EPI Creatinine Equation (2021)    Anion gap 13 5 - 15    Comment: Performed at [facility], [address], [place], [place] [address]  Protime-INR     Status: None   Collection Time: 10/05/23  7:31 PM  Result Value Ref Range   Prothrombin Time 14.6 11.4 - 15.2 seconds   INR 1.1 0.8 - 1.2    Comment: (NOTE) INR goal varies based on device and disease states. Performed at [facility], [address], [place], [place] [address]   Ammonia     Status: None   Collection Time: 10/05/23  7:31 PM   Result Value Ref Range   Ammonia 26 9 - 35 umol/L    Comment: Performed at [facility], [address], [place], [place] [address]  CK     Status: Abnormal   Collection Time: 10/05/23  7:32 PM  Result Value Ref Range   Total CK 837 (H) 38 - 234 U/L    Comment: Performed at [facility], [address], [place], [place] [address]  Procalcitonin     Status: None   Collection Time: 10/05/23  7:32 PM  Result Value Ref Range   Procalcitonin 0.25 ng/mL    Comment:        Interpretation: PCT (Procalcitonin) <= 0.5 ng/mL: Systemic infection (sepsis) is not likely. Local bacterial infection is possible. (NOTE)       Sepsis PCT Algorithm           Lower Respiratory Tract  Infection PCT Algorithm    ----------------------------     ----------------------------         PCT < 0.25 ng/mL                PCT < 0.10 ng/mL          Strongly encourage             Strongly discourage   discontinuation of antibiotics    initiation of antibiotics    ----------------------------     -----------------------------       PCT 0.25 - 0.50 ng/mL            PCT 0.10 - 0.25 ng/mL               OR       >80% decrease in PCT            Discourage initiation of                                            antibiotics      Encourage discontinuation           of antibiotics    ----------------------------     -----------------------------         PCT >= 0.50 ng/mL              PCT 0.26 - 0.50 ng/mL               AND        <80% decrease in PCT             Encourage initiation of                                             antibiotics       Encourage continuation           of antibiotics    ----------------------------     -----------------------------        PCT >= 0.50 ng/mL                  PCT > 0.50 ng/mL               AND         increase in PCT                  Strongly encourage                                      initiation of  antibiotics    Strongly encourage escalation           of antibiotics                                     -----------------------------                                           PCT <= 0.25 ng/mL  OR                                        > 80% decrease in PCT                                      Discontinue / Do not initiate                                             antibiotics  Performed at [facility], [address] [place], [place] [address]   Magnesium     Status: None   Collection Time: 10/05/23  7:32 PM  Result Value Ref Range   Magnesium 2.1 1.7 - 2.4 mg/dL    Comment: Performed at [facility], [address] [place], [place] [address]  Phosphorus     Status: Abnormal   Collection Time: 10/05/23  7:32 PM  Result Value Ref Range   Phosphorus 6.2 (H) 2.5 - 4.6 mg/dL    Comment: Performed at [facility], [address] [place], [place] [address]  Acetaminophen level     Status: Abnormal   Collection Time: 10/05/23  7:33 PM  Result Value Ref Range   Acetaminophen (Tylenol), Serum <10 (L) 10 - 30 ug/mL    Comment: (NOTE) Therapeutic concentrations vary significantly. A range of 10-30 ug/mL  may be an effective concentration for many patients. However, some  are best treated at concentrations outside of this range. Acetaminophen concentrations >150 ug/mL at 4 hours after ingestion  and >50 ug/mL at 12 hours after ingestion are often associated with  toxic reactions.  Performed at [facility], [address] [place], [place] [address]   Ethanol     Status: None   Collection Time: 10/05/23  7:33 PM  Result Value Ref Range   Alcohol, Ethyl (B) <10 <10 mg/dL    Comment: (NOTE) Lowest detectable limit for serum alcohol is 10 mg/dL.  For medical purposes only. Performed at [facility], [address], [place], [place]  [address]   Lactic acid, plasma     Status: Abnormal   Collection Time: 10/05/23  8:33 PM  Result Value Ref Range   Lactic Acid, Venous 3.2 (HH) 0.5 - 1.9 mmol/L    Comment: CRITICAL RESULT CALLED TO, READ BACK BY AND VERIFIED WITH ABOADYA RN @ [phone] ON 10/05/2023 BY MTA Performed at [facility], [address] [place], [place] [address]   CBG monitoring, ED     Status: Abnormal   Collection Time: 10/05/23 10:53 PM  Result Value Ref Range   Glucose-Capillary 283 (H) 70 - 99 mg/dL    Comment: Glucose reference range applies only to samples taken after fasting for at least 8 hours.  CBG monitoring, ED     Status: Abnormal   Collection Time: 10/06/23 12:28 AM  Result Value Ref Range   Glucose-Capillary 188 (H) 70 - 99 mg/dL    Comment: Glucose reference range applies only to samples taken after fasting for at least 8 hours.  Lactic acid, plasma     Status: Abnormal   Collection Time: 10/06/23  2:17 AM  Result Value Ref Range   Lactic Acid, Venous 2.2 (HH) 0.5 - 1.9 mmol/L    Comment: CRITICAL VALUE NOTED. VALUE IS CONSISTENT WITH PREVIOUSLY REPORTED/CALLED VALUE Performed at [facility], [address], [place], [place] [address]   CBG monitoring, ED     Status: Abnormal   Collection Time: 10/06/23  4:07 AM  Result Value Ref Range   Glucose-Capillary 110 (H) 70 - 99 mg/dL    Comment: Glucose reference range applies only to samples taken after fasting for at least 8 hours.  Lactic acid, plasma     Status: Abnormal   Collection Time: 10/06/23  4:37 AM  Result Value Ref Range   Lactic Acid, Venous 3.9 (HH) 0.5 - 1.9 mmol/L    Comment: CRITICAL VALUE NOTED. VALUE IS CONSISTENT WITH PREVIOUSLY REPORTED/CALLED VALUE Performed at [facility], [address], [place], [place] [address]   TSH     Status: Abnormal   Collection Time: 10/06/23  4:37 AM  Result Value Ref Range   TSH <0.010 (L) 0.350 - 4.500 uIU/mL    Comment: Performed  by a 3rd Generation assay with a functional sensitivity of <=0.01 uIU/mL. Performed at [facility], [address], [place], [place] [address]   CK     Status: Abnormal   Collection Time: 10/06/23  4:37 AM  Result Value Ref Range   Total CK 28,903 (H) 38 - 234 U/L    Comment: RESULT CONFIRMED BY MANUAL DILUTION Performed at [facility], [address], [place], [place] [address]    CT Head Wo Contrast Result Date: 10/05/2023 CLINICAL DATA:  Fall from bed with headaches and neck pain, initial encounter EXAM: CT HEAD WITHOUT CONTRAST CT CERVICAL SPINE WITHOUT CONTRAST TECHNIQUE: Multidetector CT imaging of the head and cervical spine was performed following the standard protocol without intravenous contrast. Multiplanar CT image reconstructions of the cervical spine were also generated. RADIATION DOSE REDUCTION: This exam was performed according to the departmental dose-optimization program which includes automated exposure control, adjustment of the mA and/or kV according to patient size and/or use of iterative reconstruction technique. COMPARISON:  11/13/2022 FINDINGS: CT HEAD FINDINGS Brain: No evidence of acute infarction, hemorrhage, hydrocephalus, extra-axial collection or mass lesion/mass effect. Vascular: No hyperdense vessel or unexpected calcification. Skull: Normal. Negative for fracture or focal lesion. Sinuses/Orbits: No acute finding. Other: None. CT CERVICAL SPINE FINDINGS Alignment: Within normal limits. Skull base and vertebrae: 7 cervical segments are well visualized. Vertebral body height is well maintained. Multilevel disc space narrowing and osteophytic changes are seen. Facet hypertrophic changes are noted. No acute fracture or acute facet abnormality is seen. The odontoid is within normal limits. Soft tissues and spinal canal: Findings soft tissue structures show vascular calcification. No other focal abnormality is noted. Upper chest:  Visualized lung apices are within normal limits. Other: None IMPRESSION: CT of the head: No acute intracranial abnormality noted. CT of the cervical spine: Multilevel degenerative change without acute abnormality. Electronically Signed   By: Violeta Grey M.D.   On: 10/05/2023 22:41   CT Cervical Spine Wo Contrast Result Date: 10/05/2023 CLINICAL DATA:  Fall from bed with headaches and neck pain, initial encounter EXAM: CT HEAD WITHOUT CONTRAST CT CERVICAL SPINE WITHOUT CONTRAST TECHNIQUE: Multidetector CT imaging of the head and cervical spine was performed following the standard protocol without intravenous contrast. Multiplanar CT image reconstructions of the cervical spine were also generated. RADIATION DOSE REDUCTION: This exam was performed according to  the departmental dose-optimization program which includes automated exposure control, adjustment of the mA and/or kV according to patient size and/or use of iterative reconstruction technique. COMPARISON:  11/13/2022 FINDINGS: CT HEAD FINDINGS Brain: No evidence of acute infarction, hemorrhage, hydrocephalus, extra-axial collection or mass lesion/mass effect. Vascular: No hyperdense vessel or unexpected calcification. Skull: Normal. Negative for fracture or focal lesion. Sinuses/Orbits: No acute finding. Other: None. CT CERVICAL SPINE FINDINGS Alignment: Within normal limits. Skull base and vertebrae: 7 cervical segments are well visualized. Vertebral body height is well maintained. Multilevel disc space narrowing and osteophytic changes are seen. Facet hypertrophic changes are noted. No acute fracture or acute facet abnormality is seen. The odontoid is within normal limits. Soft tissues and spinal canal: Findings soft tissue structures show vascular calcification. No other focal abnormality is noted. Upper chest: Visualized lung apices are within normal limits. Other: None IMPRESSION: CT of the head: No acute intracranial abnormality noted. CT of the  cervical spine: Multilevel degenerative change without acute abnormality. Electronically Signed   By: Violeta Grey M.D.   On: 10/05/2023 22:41   DG Chest Portable 1 View Result Date: 10/05/2023 CLINICAL DATA:  Overdose.  Hypoxia. EXAM: PORTABLE CHEST 1 VIEW COMPARISON:  08/29/2022 FINDINGS: The cardiac silhouette, mediastinal and hilar contours are. The lungs are clear. No infiltrates or effusions. No pulmonary edema. No pneumothorax. The bony thorax is intact. 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 IMPRESSION: 1. No acute cardiopulmonary findings. 2. Erosive arthropathy involving both shoulders. Electronically Signed   By: Marrian Siva M.D.   On: 10/05/2023 21:45    Pending Labs Unresulted Labs (From admission, onward)     Start     Ordered   10/06/23 0130  Osmolality  Once,   AD        10/06/23 0130   10/06/23 0128  T3  Add-on,   AD        10/06/23 0127   10/06/23 0128  T4, free  Add-on,   AD        10/06/23 0127   10/06/23 0100  Creatinine, urine, random  Once,   R        10/06/23 0100   10/06/23 0100  Osmolality, urine  Once,   R        10/06/23 0100   10/06/23 0100  Sodium, urine, random  Once,   R        10/06/23 0100   10/06/23 0010  Hemoglobin A1c  Add-on,   AD       Comments: To assess prior glycemic control    10/06/23 0009   Signed and Held  HIV Antibody (routine testing w rflx)  (HIV Antibody (Routine testing w reflex) panel)  Once,   R        Signed and Held   Signed and Held  Magnesium  Tomorrow morning,   R        Signed and Held   Signed and Held  Phosphorus  Tomorrow morning,   R        Signed and Held   Signed and Held  Comprehensive metabolic panel  Tomorrow morning,   R       Question:  Release to patient  Answer:  Immediate   Signed and Held   Signed and Held  CBC  Tomorrow morning,   R       Question:  Release to patient  Answer:  Immediate   Signed and  Held             Vitals/Pain Today's Vitals   10/06/23 0036 10/06/23 0045 10/06/23 0304 10/06/23 0409  BP: (!) 146/96 (!) 148/68  91/67  Pulse:  88  68  Resp:    19  Temp:   98.1 F (36.7 C) 98.1 F (36.7 C)  TempSrc:   Oral   SpO2:  100%  100%  Weight:      Height:      PainSc:        Isolation Precautions No active isolations  Medications Medications  insulin aspart (novoLOG) injection 0-9 Units ( Subcutaneous Not Given 10/06/23 0430)  naloxone [facility]) injection 0.4 mg (has no administration in time range)  0.9 %  sodium chloride infusion (has no administration in time range)  lactated ringers bolus 1,000 mL (1,000 mLs Intravenous New Bag/Given 10/05/23 2301)  insulin aspart (novoLOG) injection 6 Units (6 Units Intravenous Given 10/05/23 2312)  naloxone (NARCAN) injection 0.4 mg (0.4 mg Intravenous Given 10/06/23 0021)    Mobility walks

## 2023-10-07 ENCOUNTER — Inpatient Hospital Stay (HOSPITAL_COMMUNITY)

## 2023-10-07 DIAGNOSIS — T50901A Poisoning by unspecified drugs, medicaments and biological substances, accidental (unintentional), initial encounter: Secondary | ICD-10-CM | POA: Diagnosis not present

## 2023-10-07 LAB — COMPREHENSIVE METABOLIC PANEL WITH GFR
ALT: 139 U/L — ABNORMAL HIGH (ref 0–44)
AST: 403 U/L — ABNORMAL HIGH (ref 15–41)
Albumin: 3.5 g/dL (ref 3.5–5.0)
Alkaline Phosphatase: 55 U/L (ref 38–126)
Anion gap: 7 (ref 5–15)
BUN: 35 mg/dL — ABNORMAL HIGH (ref 8–23)
CO2: 22 mmol/L (ref 22–32)
Calcium: 9.2 mg/dL (ref 8.9–10.3)
Chloride: 99 mmol/L (ref 98–111)
Creatinine, Ser: 1.64 mg/dL — ABNORMAL HIGH (ref 0.44–1.00)
GFR, Estimated: 35 mL/min — ABNORMAL LOW (ref >60)
Glucose, Bld: 141 mg/dL — ABNORMAL HIGH (ref 70–99)
Potassium: 4.6 mmol/L (ref 3.5–5.1)
Sodium: 128 mmol/L — ABNORMAL LOW (ref 135–145)
Total Bilirubin: 0.8 mg/dL (ref 0.0–1.2)
Total Protein: 6.6 g/dL (ref 6.5–8.1)

## 2023-10-07 LAB — CBC
HCT: 33.4 % — ABNORMAL LOW (ref 36.0–46.0)
Hemoglobin: 10.4 g/dL — ABNORMAL LOW (ref 12.0–15.0)
MCH: 28.2 pg (ref 26.0–34.0)
MCHC: 31.1 g/dL (ref 30.0–36.0)
MCV: 90.5 fL (ref 80.0–100.0)
Platelets: 178 10*3/uL (ref 150–400)
RBC: 3.69 MIL/uL — ABNORMAL LOW (ref 3.87–5.11)
RDW: 14.3 % (ref 11.5–15.5)
WBC: 6.3 10*3/uL (ref 4.0–10.5)
nRBC: 0 % (ref 0.0–0.2)

## 2023-10-07 LAB — HEMOGLOBIN A1C
Hgb A1c MFr Bld: 8.1 % — ABNORMAL HIGH (ref 4.8–5.6)
Mean Plasma Glucose: 186 mg/dL

## 2023-10-07 LAB — GLUCOSE, CAPILLARY
Glucose-Capillary: 179 mg/dL — ABNORMAL HIGH (ref 70–99)
Glucose-Capillary: 144 mg/dL — ABNORMAL HIGH (ref 70–99)
Glucose-Capillary: 137 mg/dL — ABNORMAL HIGH (ref 70–99)
Glucose-Capillary: 140 mg/dL — ABNORMAL HIGH (ref 70–99)
Glucose-Capillary: 105 mg/dL — ABNORMAL HIGH (ref 70–99)
Glucose-Capillary: 193 mg/dL — ABNORMAL HIGH (ref 70–99)
Glucose-Capillary: 131 mg/dL — ABNORMAL HIGH (ref 70–99)

## 2023-10-07 LAB — MAGNESIUM: Magnesium: 2.3 mg/dL (ref 1.7–2.4)

## 2023-10-07 LAB — PHOSPHORUS: Phosphorus: 4.2 mg/dL (ref 2.5–4.6)

## 2023-10-07 LAB — T3: T3, Total: 106 ng/dL (ref 71–180)

## 2023-10-07 LAB — CK: Total CK: 8783 U/L — ABNORMAL HIGH (ref 38–234)

## 2023-10-07 MED ORDER — SODIUM CHLORIDE 0.9 % IV SOLN
INTRAVENOUS | Status: AC
Start: 2023-10-07 — End: 2023-10-07

## 2023-10-07 NOTE — Progress Notes (Signed)
   10/07/23 1414  TOC Brief Assessment  Insurance and Status Reviewed  Patient has primary care physician Yes  Home environment has been reviewed home w/ roommate  Prior level of function: independent  Prior/Current Home Services No current home services  Social Drivers of Health Review SDOH reviewed no interventions necessary  Readmission risk has been reviewed Yes  Transition of care needs no transition of care needs at this time

## 2023-10-07 NOTE — Plan of Care (Signed)

## 2023-10-07 NOTE — Progress Notes (Addendum)
 PROGRESS NOTE  NATANE HEWARD VOZ:[id number] DOB: 1961-05-03 DOA: 10/05/2023 PCP: No primary care provider on file.   LOS: 1 day   Brief Narrative / Interim history: 63 year old female with history of DM2, hypothyroidism, hypertension, liver cirrhosis, history of polysubstance use comes into the hospital after being found down next to her bed, was lethargic as well as hypoxic.  Apparently she fell out of bed and has been laying on the ground for an estimated half a day.  On initial evaluation was given Narcan and became awake and alert and complaining of generalized pain.  She denied opioid use but did state that she took couple of klonopin pills and smoked marijuana.  Apparently her roommate is doing fentanyl and heroin, and she tells me she did come in contact with a purple crystallized powder but denies consuming it.  She also mentions that on occasion she has to call 911 on her roommate due to unresponsiveness  Subjective / 24h Interval events: Complains of some shortness of breath, no chest pain, no lightheadedness or dizziness  Assesement and Plan: Principal Problem:   Overdose, accidental or unintentional, initial encounter Active Problems:   Thyroid disease   Type 2 diabetes mellitus with hyperglycemia (HCC)   Other cirrhosis of liver (HCC)   Essential hypertension   Acute metabolic encephalopathy   Hyponatremia   Lactic acidosis   Rhabdomyolysis   Principal problem Acute metabolic encephalopathy -with concern for polysubstance abuse.  Urine drug screen was positive for benzodiazepine as well as marijuana.  Continue to closely monitor - She is alert on my evaluation, but still remains somewhat encephalopathic today, and has difficulties getting the words out at times but inconsistent.  Obtain MRI  Active problems Rhabdomyolysis -in the setting of being down, nontraumatic.  CK with dramatic rise from 837 on admission to almost 29,000 on 4/15, has been placed on fluids, improving  now.  Continue fluids  Hypothyroidism-TSH undetectable.  Crease Synthroid dose  Lactic acidosis-in the setting of being down.  On fluids  DM2, poorly controlled, with hyperglycemia -continue sliding scale.  Most recent A1c 8.0  Hyponatremia-pseudohyponatremia due to hyperglycemia  Acute kidney injury -possibly in the setting of rhabdomyolysis, creatinine moving  Liver cirrhosis-with remote EtOH use.  Appears compensated   Scheduled Meds:  insulin aspart  0-9 Units Subcutaneous Q4H   levothyroxine  125 mcg Oral QAC breakfast   lidocaine  1 patch Transdermal Q24H   Continuous Infusions:  sodium chloride     PRN Meds:.acetaminophen, albuterol, baclofen, naLOXone (NARCAN)  injection, ondansetron **OR** ondansetron (ZOFRAN) IV  Current Outpatient Medications  Medication Instructions   acetaminophen (TYLENOL) 650 mg, Oral, Every 4 hours PRN   albuterol (VENTOLIN HFA) 108 (90 Base) MCG/ACT inhaler 2 puffs, Inhalation, Every 6 hours PRN   atorvastatin (LIPITOR) 10 mg, Oral, Daily   baclofen (LIORESAL) 10 mg, Oral, 3 times daily PRN   empagliflozin (JARDIANCE) 25 mg, Oral, Daily   levothyroxine (SYNTHROID) 150 mcg, Oral, Daily before breakfast   lidocaine (LIDODERM) 5 % 1 patch, Transdermal, Every 24 hours, Remove & Discard patch within 12 hours or as directed by MD   lisinopril (ZESTRIL) 5 mg, Oral, Daily   metFORMIN (GLUMETZA) 1,000 mg, Oral, 2 times daily with meals    Diet Orders (From admission, onward)     Start     Ordered   10/06/23 1911  Diet Carb Modified Fluid consistency: Thin; Room service appropriate? Yes  Diet effective now       Question Answer  Comment  Diet-HS Snack? Nothing   Calorie Level Medium 1600-2000   Fluid consistency: Thin   Room service appropriate? Yes      10/06/23 1910            DVT prophylaxis: SCDs Start: 10/06/23 1911   Lab Results  Component Value Date   PLT 178 10/07/2023      Code Status: Full Code  Family Communication:  No family at bedside  Status is: Inpatient  Level of care: Telemetry  Consultants:  None  Objective: Vitals:   10/06/23 1605 10/06/23 1615 10/06/23 1951 10/07/23 0454  BP:   (!) 142/128 (!) 103/59  Pulse:   76 76  Resp: 18 16 16 16   Temp:   98.7 F (37.1 C) 98.8 F (37.1 C)  TempSrc:   Oral Oral  SpO2: 100% 98% 94% 93%  Weight:      Height:        Intake/Output Summary (Last 24 hours) at 10/07/2023 1053 Last data filed at 10/07/2023 0500 Gross per 24 hour  Intake 845.32 ml  Output --  Net 845.32 ml   Wt Readings from Last 3 Encounters:  10/05/23 69.9 kg  11/13/22 63.5 kg  08/29/22 63.5 kg    Examination:  Constitutional: NAD Eyes: lids and conjunctivae normal, no scleral icterus ENMT: mmm Neck: normal, supple Respiratory: clear to auscultation bilaterally, no wheezing, no crackles. Normal respiratory effort.  Cardiovascular: Regular rate and rhythm, no murmurs / rubs / gallops. No LE edema. Abdomen: soft, no distention, no tenderness. Bowel sounds positive.   Data Reviewed: I have independently reviewed following labs and imaging studies   CBC Recent Labs  Lab 10/05/23 1931 10/06/23 2119 10/07/23 0350  WBC 12.2* 5.8 6.3  HGB 11.5* 9.7* 10.4*  HCT 35.1* 30.5* 33.4*  PLT 231 140* 178  MCV 87.5 90.0 90.5  MCH 28.7 28.6 28.2  MCHC 32.8 31.8 31.1  RDW 13.7 14.3 14.3  LYMPHSABS 0.6*  --   --   MONOABS 0.8  --   --   EOSABS 0.0  --   --   BASOSABS 0.0  --   --     Recent Labs  Lab 10/05/23 1931 10/05/23 1932 10/05/23 2033 10/06/23 0217 10/06/23 0437 10/06/23 1947 10/07/23 0350  NA 131*  --   --   --   --  129* 128*  K 4.7  --   --   --   --  4.7 4.6  CL 97*  --   --   --   --  98 99  CO2 21*  --   --   --   --  20* 22  GLUCOSE 519*  --   --   --   --  170* 141*  BUN 24*  --   --   --   --  34* 35*  CREATININE 1.48*  --   --   --   --  1.88* 1.64*  CALCIUM 9.3  --   --   --   --  9.6 9.2  AST 44*  --   --   --   --  518* 403*  ALT 36  --    --   --   --  157* 139*  ALKPHOS 60  --   --   --   --  54 55  BILITOT 0.5  --   --   --   --  0.9 0.8  ALBUMIN 3.8  --   --   --   --  3.6 3.5  MG  --  2.1  --   --   --  2.2 2.3  PROCALCITON  --  0.25  --   --   --   --   --   LATICACIDVEN  --   --  3.2* 2.2* 3.9*  --   --   INR 1.1  --   --   --   --   --   --   TSH  --   --   --   --  <0.010*  --   --   HGBA1C 8.1*  --   --   --   --   --   --   AMMONIA 26  --   --   --   --   --   --     ------------------------------------------------------------------------------------------------------------------ No results for input(s): "CHOL", "HDL", "LDLCALC", "TRIG", "CHOLHDL", "LDLDIRECT" in the last 72 hours.  Lab Results  Component Value Date   HGBA1C 8.1 (H) 10/05/2023   ------------------------------------------------------------------------------------------------------------------ Recent Labs    10/06/23 0437  TSH <0.010*    Cardiac Enzymes No results for input(s): "CKMB", "TROPONINI", "MYOGLOBIN" in the last 168 hours.  Invalid input(s): "CK" ------------------------------------------------------------------------------------------------------------------    Component Value Date/Time   BNP 48.8 03/27/2022 1000    CBG: Recent Labs  Lab 10/06/23 1943 10/06/23 2344 10/07/23 0131 10/07/23 0455 10/07/23 0745  GLUCAP 180* 158* 179* 144* 137*    No results found for this or any previous visit (from the past 240 hours).   Radiology Studies: No results found.  Kathlen Para, MD, PhD [facility]  Between 7 am - 7 pm I am available, please contact me via Amion (for emergencies) or Securechat (non urgent messages)  Between 7 pm - 7 am I am not available, please contact night coverage MD/APP via Amion

## 2023-10-08 ENCOUNTER — Other Ambulatory Visit (HOSPITAL_COMMUNITY): Payer: Self-pay

## 2023-10-08 DIAGNOSIS — T50901A Poisoning by unspecified drugs, medicaments and biological substances, accidental (unintentional), initial encounter: Secondary | ICD-10-CM | POA: Diagnosis not present

## 2023-10-08 LAB — CBC
HCT: 32.7 % — ABNORMAL LOW (ref 36.0–46.0)
Hemoglobin: 9.9 g/dL — ABNORMAL LOW (ref 12.0–15.0)
MCH: 28 pg (ref 26.0–34.0)
MCHC: 30.3 g/dL (ref 30.0–36.0)
MCV: 92.4 fL (ref 80.0–100.0)
Platelets: 150 10*3/uL (ref 150–400)
RBC: 3.54 MIL/uL — ABNORMAL LOW (ref 3.87–5.11)
RDW: 13.9 % (ref 11.5–15.5)
WBC: 4.1 10*3/uL (ref 4.0–10.5)
nRBC: 0 % (ref 0.0–0.2)

## 2023-10-08 LAB — BASIC METABOLIC PANEL WITH GFR
Anion gap: 9 (ref 5–15)
BUN: 27 mg/dL — ABNORMAL HIGH (ref 8–23)
CO2: 21 mmol/L — ABNORMAL LOW (ref 22–32)
Calcium: 9.5 mg/dL (ref 8.9–10.3)
Chloride: 107 mmol/L (ref 98–111)
Creatinine, Ser: 1.03 mg/dL — ABNORMAL HIGH (ref 0.44–1.00)
GFR, Estimated: >60 mL/min (ref >60)
Glucose, Bld: 106 mg/dL — ABNORMAL HIGH (ref 70–99)
Potassium: 4.4 mmol/L (ref 3.5–5.1)
Sodium: 137 mmol/L (ref 135–145)

## 2023-10-08 LAB — GLUCOSE, CAPILLARY
Glucose-Capillary: 110 mg/dL — ABNORMAL HIGH (ref 70–99)
Glucose-Capillary: 133 mg/dL — ABNORMAL HIGH (ref 70–99)
Glucose-Capillary: 175 mg/dL — ABNORMAL HIGH (ref 70–99)

## 2023-10-08 LAB — CK: Total CK: 4081 U/L — ABNORMAL HIGH (ref 38–234)

## 2023-10-08 MED ORDER — LEVOTHYROXINE SODIUM 125 MCG PO TABS
125.0000 ug | ORAL_TABLET | Freq: Every day | ORAL | 0 refills | Status: AC
  Filled 2023-10-08: qty 90, 90d supply, fill #0

## 2023-10-08 NOTE — Plan of Care (Signed)

## 2023-10-08 NOTE — Discharge Summary (Signed)
 Physician Discharge Summary  Taylor Benitez:[id number] DOB: [birth year] DOA: 10/05/2023  PCP: No primary care provider on file.  Admit date: 10/05/2023 Discharge date: 10/08/2023  Admitted From: home Disposition:  home  Recommendations for Outpatient Follow-up:  Follow up with PCP in 1-2 weeks Please obtain TSH in 3-4 weeks  Home Health: PT Equipment/Devices: walker  Discharge Condition: stable CODE STATUS: Full code  Brief Narrative / Interim history: 63 year old female with history of DM2, hypothyroidism, hypertension, liver cirrhosis, history of polysubstance use comes into the hospital after being found down next to her bed, was lethargic as well as hypoxic.  Apparently she fell out of bed and has been laying on the ground for an estimated half a day.  On initial evaluation was given Narcan and became awake and alert and complaining of generalized pain.  She denied opioid use but did state that she took couple of klonopin pills and smoked marijuana.  Apparently her roommate is doing fentanyl and heroin, and she tells me she did come in contact with a purple crystallized powder but denies consuming it.  She also mentions that on occasion she has to call 911 on her roommate due to unresponsiveness  Hospital Course / Discharge diagnoses: Principal Problem:   Overdose, accidental or unintentional, initial encounter Active Problems:   Thyroid disease   Type 2 diabetes mellitus with hyperglycemia (HCC)   Other cirrhosis of liver (HCC)   Essential hypertension   Acute metabolic encephalopathy   Hyponatremia   Lactic acidosis   Rhabdomyolysis   Principal problem Acute metabolic encephalopathy -with concern for polysubstance abuse.  Urine drug screen was positive for benzodiazepine as well as marijuana.  Patient was treated conservatively, improved, now alert and oriented x 4 and feels back to baseline.  Due to some intermittent expressive aphasia underwent an MRI of the brain  which was negative for acute findings, suspect close to baseline.  Active problems Rhabdomyolysis -in the setting of being down, nontraumatic.  CK with dramatic rise from 837 on admission to almost 29,000 on 4/15, has been placed on fluids, now improving  Hypothyroidism-TSH undetectable.  Her Synthroid dose was decreased  Lactic acidosis-in the setting of being down DM2, poorly controlled, with hyperglycemia -continue home regimen.  Most recent A1c 8.0 Hyponatremia-pseudohyponatremia due to hyperglycemia Acute kidney injury -possibly in the setting of rhabdomyolysis, creatinine returned to normal with fluids  Liver cirrhosis-with remote EtOH use.  Appears compensated  Sepsis ruled out   Discharge Instructions   Allergies as of 10/08/2023       Reactions   Glipizide    GI side eff   Penicillins Rash   Tolerated Cephalosporin Date: 11/04/19.        Medication List     TAKE these medications    acetaminophen 325 MG tablet Commonly known as: TYLENOL Take 2 tablets (650 mg total) by mouth every 4 (four) hours as needed for mild pain (or temp > 37.5 C (99.5 F)).   albuterol 108 (90 Base) MCG/ACT inhaler Commonly known as: VENTOLIN HFA Inhale 2 puffs into the lungs every 6 (six) hours as needed for wheezing or shortness of breath.   atorvastatin 10 MG tablet Commonly known as: Lipitor Take 1 tablet (10 mg total) by mouth daily.   baclofen 10 MG tablet Commonly known as: LIORESAL Take 10 mg by mouth 3 (three) times daily as needed for muscle spasms.   empagliflozin 25 MG Tabs tablet Commonly known as: JARDIANCE Take 25 mg by mouth daily.  levothyroxine 125 MCG tablet Commonly known as: SYNTHROID Take 1 tablet (125 mcg total) by mouth daily before breakfast. Start taking on: October 09, 2023 What changed:  when to take this Another medication with the same name was removed. Continue taking this medication, and follow the directions you see here.   lidocaine 5  % Commonly known as: LIDODERM Place 1 patch onto the skin daily. Remove & Discard patch within 12 hours or as directed by MD What changed:  when to take this reasons to take this   lisinopril 5 MG tablet Commonly known as: ZESTRIL Take 5 mg by mouth daily.   metFORMIN 1000 MG (MOD) 24 hr tablet Commonly known as: GLUMETZA Take 1,000 mg by mouth 2 (two) times daily with a meal.               Durable Medical Equipment  (From admission, onward)           Start     Ordered   10/08/23 1027  For home use only DME Walker  Once       Question:  Patient needs a walker to treat with the following condition  Answer:  Poor balance   10/08/23 1027           Consultations: none  Procedures/Studies:  MR BRAIN WO CONTRAST Result Date: 10/07/2023 CLINICAL DATA:  63 year old female with altered mental status, recent fall from bed, respiratory distress. EXAM: MRI HEAD WITHOUT CONTRAST TECHNIQUE: Multiplanar, multiecho pulse sequences of the brain and surrounding structures were obtained without intravenous contrast. COMPARISON:  Head CT 10/05/2023.  Brain MRI 08/30/2022. FINDINGS: Brain: No restricted diffusion to suggest acute infarction. No midline shift, mass effect, evidence of mass lesion, ventriculomegaly, extra-axial collection or acute intracranial hemorrhage. Cervicomedullary junction and pituitary are within normal limits. Chronic cerebral white matter signal changes are sometimes nodular and periventricular as on series 9, image 28. 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 No change from the MRI last year. No cortical encephalomalacia identified, but there is a chronic microhemorrhage in the anterior left frontal lobe on series 10, image 36 (stable). Mild signal heterogeneity in the deep gray nuclei. Brainstem and cerebellum appear negative and no new signal abnormality compared to last year. Vascular: Major  intracranial vascular flow voids are preserved, stable since last year. Skull and upper cervical spine: Fairly advanced chronic cervical disc and endplate degeneration at C3-C4 is visible. Background bone marrow signal is within normal limits. Sinuses/Orbits: Chronic left orbital floor fracture. Otherwise negative. Other: Bilateral mastoid effusions are new/increased from last year. Negative visible nasopharynx. Grossly negative other visible internal auditory structures. IMPRESSION: 1. No acute intracranial abnormality. 2. Chronic signal changes in the brain favored to be small vessel disease related are stable since last year. 3. Postinflammatory appearing bilateral mastoid effusions. Electronically Signed   By: Odessa Fleming M.D.   On: 10/07/2023 14:25   DG CHEST PORT 1 VIEW Result Date: 10/07/2023 CLINICAL DATA:  32355 Hypoxemia [id number] EXAM: PORTABLE CHEST 1 VIEW COMPARISON:  October 05, 2023 FINDINGS: No focal airspace consolidation, pleural effusion, or pneumothorax. No cardiomegaly. No acute fracture or destructive lesion. Similar changes of both humeral heads, possibly degenerative. Multilevel thoracic osteophytosis. IMPRESSION: No acute cardiopulmonary abnormality. Electronically Signed   By: Wallie Char M.D.   On: 10/07/2023 12:24   CT Head Wo Contrast Result Date: 10/05/2023 CLINICAL DATA:  Fall from bed with headaches and neck pain, initial encounter EXAM:  CT HEAD WITHOUT CONTRAST CT CERVICAL SPINE WITHOUT CONTRAST TECHNIQUE: Multidetector CT imaging of the head and cervical spine was performed following the standard protocol without intravenous contrast. Multiplanar CT image reconstructions of the cervical spine were also generated. RADIATION DOSE REDUCTION: This exam was performed according to the departmental dose-optimization program which includes automated exposure control, adjustment of the mA and/or kV according to patient size and/or use of iterative reconstruction technique. COMPARISON:   11/13/2022 FINDINGS: CT HEAD FINDINGS Brain: No evidence of acute infarction, hemorrhage, hydrocephalus, extra-axial collection or mass lesion/mass effect. Vascular: No hyperdense vessel or unexpected calcification. Skull: Normal. Negative for fracture or focal lesion. Sinuses/Orbits: No acute finding. Other: None. CT CERVICAL SPINE FINDINGS Alignment: Within normal limits. Skull base and vertebrae: 7 cervical segments are well visualized. Vertebral body height is well maintained. Multilevel disc space narrowing and osteophytic changes are seen. Facet hypertrophic changes are noted. No acute fracture or acute facet abnormality is seen. The odontoid is within normal limits. Soft tissues and spinal canal: Findings soft tissue structures show vascular calcification. No other focal abnormality is noted. Upper chest: Visualized lung apices are within normal limits. Other: None IMPRESSION: CT of the head: No acute intracranial abnormality noted. CT of the cervical spine: Multilevel degenerative change without acute abnormality. Electronically Signed   By: Violeta Grey M.D.   On: 10/05/2023 22:41   CT Cervical Spine Wo Contrast Result Date: 10/05/2023 CLINICAL DATA:  Fall from bed with headaches and neck pain, initial encounter EXAM: CT HEAD WITHOUT CONTRAST CT CERVICAL SPINE WITHOUT CONTRAST TECHNIQUE: Multidetector CT imaging of the head and cervical spine was performed following the standard protocol without intravenous contrast. Multiplanar CT image reconstructions of the cervical spine were also generated. RADIATION DOSE REDUCTION: This exam was performed according to the departmental dose-optimization program which includes automated exposure control, adjustment of the mA and/or kV according to patient size and/or use of iterative reconstruction technique. COMPARISON:  11/13/2022 FINDINGS: CT HEAD FINDINGS Brain: No evidence of acute infarction, hemorrhage, hydrocephalus, extra-axial collection or mass lesion/mass  effect. Vascular: No hyperdense vessel or unexpected calcification. Skull: Normal. Negative for fracture or focal lesion. Sinuses/Orbits: No acute finding. Other: None. CT CERVICAL SPINE FINDINGS Alignment: Within normal limits. Skull base and vertebrae: 7 cervical segments are well visualized. Vertebral body height is well maintained. Multilevel disc space narrowing and osteophytic changes are seen. Facet hypertrophic changes are noted. No acute fracture or acute facet abnormality is seen. The odontoid is within normal limits. Soft tissues and spinal canal: Findings soft tissue structures show vascular calcification. No other focal abnormality is noted. Upper chest: Visualized lung apices are within normal limits. Other: None IMPRESSION: CT of the head: No acute intracranial abnormality noted. CT of the cervical spine: Multilevel degenerative change without acute abnormality. Electronically Signed   By: Violeta Grey M.D.   On: 10/05/2023 22:41   DG Chest Portable 1 View Result Date: 10/05/2023 CLINICAL DATA:  Overdose.  Hypoxia. EXAM: PORTABLE CHEST 1 VIEW COMPARISON:  08/29/2022 FINDINGS: The cardiac silhouette, mediastinal and hilar contours are. The lungs are clear. No infiltrates or effusions. No pulmonary edema. No pneumothorax. The bony thorax is intact. 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 IMPRESSION: 1. No acute cardiopulmonary findings. 2. Erosive arthropathy involving both shoulders. Electronically Signed   By: Marrian Siva M.D.   On: 10/05/2023 21:45     Subjective: - no chest pain, shortness of breath, no abdominal pain, nausea or vomiting.  Discharge Exam: BP (!) 165/62 (BP Location: Left Arm)   Pulse 73   Temp 98.4 F (36.9 C) (Oral)   Resp 15   Ht 5\' 1"  (1.549 m)   Wt 69.9 kg   SpO2 98%   BMI 29.10 kg/m   General: Pt is alert, awake, not in acute distress Cardiovascular: RRR, S1/S2  +, no rubs, no gallops Respiratory: CTA bilaterally, no wheezing, no rhonchi Abdominal: Soft, NT, ND, bowel sounds + Extremities: no edema, no cyanosis    The results of significant diagnostics from this hospitalization (including imaging, microbiology, ancillary and laboratory) are listed below for reference.     Microbiology: No results found for this or any previous visit (from the past 240 hours).   Labs: Basic Metabolic Panel: Recent Labs  Lab 10/05/23 1931 10/05/23 1932 10/06/23 1947 10/07/23 0350 10/08/23 0409  NA 131*  --  129* 128* 137  K 4.7  --  4.7 4.6 4.4  CL 97*  --  98 99 107  CO2 21*  --  20* 22 21*  GLUCOSE 519*  --  170* 141* 106*  BUN 24*  --  34* 35* 27*  CREATININE 1.48*  --  1.88* 1.64* 1.03*  CALCIUM 9.3  --  9.6 9.2 9.5  MG  --  2.1 2.2 2.3  --   PHOS  --  6.2* 5.4* 4.2  --    Liver Function Tests: Recent Labs  Lab 10/05/23 1931 10/06/23 1947 10/07/23 0350  AST 44* 518* 403*  ALT 36 157* 139*  ALKPHOS 60 54 55  BILITOT 0.5 0.9 0.8  PROT 7.0 6.7 6.6  ALBUMIN 3.8 3.6 3.5   CBC: Recent Labs  Lab 10/05/23 1931 10/06/23 2119 10/07/23 0350 10/08/23 0409  WBC 12.2* 5.8 6.3 4.1  NEUTROABS 10.6*  --   --   --   HGB 11.5* 9.7* 10.4* 9.9*  HCT 35.1* 30.5* 33.4* 32.7*  MCV 87.5 90.0 90.5 92.4  PLT 231 140* 178 150   CBG: Recent Labs  Lab 10/07/23 1741 10/07/23 1956 10/07/23 2313 10/08/23 0418 10/08/23 0728  GLUCAP 105* 193* 131* 110* 133*   Hgb A1c Recent Labs    10/05/23 1931  HGBA1C 8.1*   Lipid Profile No results for input(s): "CHOL", "HDL", "LDLCALC", "TRIG", "CHOLHDL", "LDLDIRECT" in the last 72 hours. Thyroid function studies Recent Labs    10/06/23 0437  TSH <0.010*   Urinalysis    Component Value Date/Time   COLORURINE YELLOW 10/05/2023 1839   APPEARANCEUR CLEAR 10/05/2023 1839   LABSPEC 1.013 10/05/2023 1839   PHURINE 6.0 10/05/2023 1839   GLUCOSEU >=500 (A) 10/05/2023 1839   HGBUR MODERATE (A) 10/05/2023  1839   BILIRUBINUR NEGATIVE 10/05/2023 1839   KETONESUR NEGATIVE 10/05/2023 1839   PROTEINUR 30 (A) 10/05/2023 1839   UROBILINOGEN 1.0 01/29/2009 0533   NITRITE NEGATIVE 10/05/2023 1839   LEUKOCYTESUR TRACE (A) 10/05/2023 1839    FURTHER DISCHARGE INSTRUCTIONS:   Get Medicines reviewed and adjusted: Please take all your medications with you for your next visit with your Primary MD   Laboratory/radiological data: Please request your Primary MD to go over all hospital tests and procedure/radiological results at the follow up, please ask your Primary MD to get all Hospital records sent to his/her office.   In some cases, they will be blood work, cultures and biopsy results pending at the time of your discharge. Please request that your primary care M.D. goes through all the records of your hospital data and  follows up on these results.   Also Note the following: If you experience worsening of your admission symptoms, develop shortness of breath, life threatening emergency, suicidal or homicidal thoughts you must seek medical attention immediately by calling 911 or calling your MD immediately  if symptoms less severe.   You must read complete instructions/literature along with all the possible adverse reactions/side effects for all the Medicines you take and that have been prescribed to you. Take any new Medicines after you have completely understood and accpet all the possible adverse reactions/side effects.    Do not drive when taking Pain medications or sleeping medications (Benzodaizepines)   Do not take more than prescribed Pain, Sleep and Anxiety Medications. It is not advisable to combine anxiety,sleep and pain medications without talking with your primary care practitioner   Special Instructions: If you have smoked or chewed Tobacco  in the last 2 yrs please stop smoking, stop any regular Alcohol  and or any Recreational drug use.   Wear Seat belts while driving.   Please  note: You were cared for by a hospitalist during your hospital stay. Once you are discharged, your primary care physician will handle any further medical issues. Please note that NO REFILLS for any discharge medications will be authorized once you are discharged, as it is imperative that you return to your primary care physician (or establish a relationship with a primary care physician if you do not have one) for your post hospital discharge needs so that they can reassess your need for medications and monitor your lab values.  Time coordinating discharge: 35 minutes  SIGNED:  Kathlen Para, MD, PhD 10/08/2023, 10:38 AM

## 2023-10-08 NOTE — Progress Notes (Signed)
 Physical Therapy Treatment Patient Details Name: Taylor Benitez MRN: [id number] DOB: [birth year] Today's Date: 10/08/2023   History of Present Illness Taylor Benitez is a 63 y.o. 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    If plan is discharge home, recommend the following: A little help with walking and/or transfers;A little help with bathing/dressing/bathroom;Assistance with cooking/housework;Assist for transportation;Help with stairs or ramp for entrance   Can travel by private vehicle        Equipment Recommendations   (youth)    Recommendations for Other Services       Precautions / Restrictions Precautions Precautions: Fall Recall of Precautions/Restrictions: Intact Restrictions Weight Bearing Restrictions Per Provider Order: No     Mobility  Bed Mobility Overal bed mobility: Modified Independent                  Transfers Overall transfer level: Modified independent Equipment used: Rolling walker (2 wheels)               General transfer comment: STS    Ambulation/Gait Ambulation/Gait assistance: Supervision Gait Distance (Feet): 110 Feet Assistive device: Rolling walker (2 wheels) Gait Pattern/deviations: Step-through pattern, Decreased stride length, Wide base of support Gait velocity: dec     General Gait Details: Pt able to navigate with 2WW at Sup A, uses wide BOS, minor reliance on 2WW for stability   Stairs Stairs: Yes Stairs assistance: Supervision Stair Management: No rails Number of Stairs: 3 General stair comments: 2WW used to step up backwards with added support from AD,  improved tolerance from initial attempt. Pt able to descend with same technique.   Wheelchair Mobility     Tilt Bed    Modified Rankin (Stroke Patients Only)       Balance Overall balance assessment: Independent Sitting-balance support: Feet supported, No upper extremity supported Sitting balance-Leahy Scale: Good     Standing balance support: No upper extremity supported Standing balance-Leahy Scale: Fair Standing balance comment: static standing EOB                            Communication Communication Communication: No apparent difficulties  Cognition Arousal: Alert Behavior During Therapy: WFL for tasks assessed/performed   PT - Cognitive impairments: No apparent impairments                         Following commands: Intact      Cueing Cueing Techniques: Verbal cues, Gestural cues  Exercises      General Comments        Pertinent Vitals/Pain Pain Assessment Pain Assessment: No/denies pain    Home Living Family/patient expects to be discharged to:: Private residence Living Arrangements: Non-relatives/Friends Available Help at Discharge: Family;Available PRN/intermittently Type of Home: House Home Access: Stairs to enter Entrance Stairs-Rails: None   Alternate Level Stairs-Number of Steps: does not go upstairs, alt residence Home Layout: Able to live on main level with bedroom/bathroom;Two level   Additional Comments: used to have walker following hip and knee replacements    Prior Function            PT  Goals (current goals can now be found in the care plan section) Acute Rehab PT Goals Patient Stated Goal: safely enter home PT Goal Formulation: With patient Time For Goal Achievement: 10/22/23 Potential to Achieve Goals: Good Progress towards PT goals: Progressing toward goals    Frequency    Min 2X/week      PT Plan      Co-evaluation              AM-PAC PT "6 Clicks" Mobility   Outcome Measure   Help needed turning from your back to your side while in a flat bed without using bedrails?: None Help needed moving from lying on your back to sitting on the side of a flat bed without using bedrails?: A Little Help needed moving to and from a bed to a chair (including a wheelchair)?: A Little Help needed standing up from a chair using your arms (e.g., wheelchair or bedside chair)?: None Help needed to walk in hospital room?: A Little Help needed climbing 3-5 steps with a railing? : A Little 6 Click Score: 20    End of Session Equipment Utilized During Treatment: Gait belt Activity Tolerance: Patient tolerated treatment well Patient left: in bed;with bed alarm set;with call bell/phone within reach Nurse Communication: Mobility status PT Visit Diagnosis: Unsteadiness on feet (R26.81);Difficulty in walking, not elsewhere classified (R26.2)     Time: 5784-6962 PT Time Calculation (min) (ACUTE ONLY): 10 min  Charges:    $Gait Training: 8-22 mins PT General Charges $$ ACUTE PT VISIT: 1 Visit                     Darien Eden, PT Acute Rehabilitation Services Office: [phone] 10/08/2023    Serafin Dames 10/08/2023, 10:24 AM

## 2023-10-08 NOTE — Progress Notes (Signed)
 TOC meds delivered in a secure bag to patient in room bythis RN

## 2023-10-08 NOTE — Evaluation (Addendum)
 Physical Therapy Evaluation Patient Details Name: Taylor Benitez MRN: [id number] DOB: 10-02-1960 Today's Date: 10/08/2023  History of Present Illness  Taylor Benitez is a 63 y.o. 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 Pt in bed when PT arrives, she is able to come to sit EOB with use of hand rails, requires 2WW for support with STS, able to maintain standing balance without AD but does not complete steps without walker. Pt has 3 steps without HR to enter/exit home, requires min A (HHA) without railing. I provided pt with gait belt for home, she would succeed with assistance from someone, unsure if she will be able to have help when she gets home. Pt currently with functional limitations due to the deficits listed below (see PT Problem List). Pt will benefit from acute skilled PT to increase their independence and safety with mobility to allow discharge.           If plan is discharge home, recommend the following: A little help with walking and/or transfers;A little help with bathing/dressing/bathroom;Assistance with cooking/housework;Assist for transportation;Help with stairs or ramp for entrance   Can travel by private vehicle        Equipment Recommendations Rolling walker (2 wheels)  Recommendations for Other Services       Functional Status Assessment Patient has had a recent decline in their functional status and demonstrates the ability to make significant improvements in function in a reasonable and predictable amount of time.     Precautions / Restrictions Precautions Precautions: Fall Recall of Precautions/Restrictions: Intact Restrictions Weight Bearing Restrictions Per Provider Order: No      Mobility  Bed Mobility Overal bed mobility: Modified Independent                   Transfers Overall transfer level: Modified independent Equipment used: Rolling walker (2 wheels)               General transfer comment: STS    Ambulation/Gait Ambulation/Gait assistance: Supervision Gait Distance (Feet): 110 Feet Assistive device: Rolling walker (2 wheels) Gait Pattern/deviations: Step-through pattern, Decreased stride length, Wide base of support Gait velocity: dec     General Gait Details: Pt able to navigate with 2WW at Sup A, uses wide BOS, minor reliance on 2WW for stability  Stairs Stairs: Yes Stairs assistance: Min assist Stair Management: No rails Number of Stairs: 3 General stair comments: dec balance, apprehension  Wheelchair Mobility     Tilt Bed    Modified Rankin (Stroke Patients Only)       Balance Overall balance assessment: Needs assistance Sitting-balance support: Feet supported, No upper extremity supported Sitting balance-Leahy Scale: Good     Standing balance support: No upper extremity supported Standing balance-Leahy Scale: Fair Standing balance comment: static standing EOB                             Pertinent Vitals/Pain Pain Assessment Pain Assessment: No/denies pain    Home Living Family/patient expects to be discharged to:: Private residence Living Arrangements: Non-relatives/Friends Available Help at Discharge: Family;Available PRN/intermittently Type of Home: House Home Access: Stairs to enter Entrance Stairs-Rails: None   Alternate Level Stairs-Number of Steps: does not go upstairs, alt residence Home Layout: Able to live on main level with bedroom/bathroom;Two level   Additional Comments: used to have  walker following hip and knee replacements    Prior Function Prior Level of Function : Independent/Modified Independent                     Extremity/Trunk Assessment        Lower Extremity Assessment Lower Extremity Assessment: Generalized weakness    Cervical / Trunk  Assessment Cervical / Trunk Assessment: Normal  Communication   Communication Communication: No apparent difficulties    Cognition Arousal: Alert Behavior During Therapy: WFL for tasks assessed/performed   PT - Cognitive impairments: No apparent impairments                         Following commands: Intact       Cueing Cueing Techniques: Verbal cues, Gestural cues     General Comments      Exercises     Assessment/Plan    PT Assessment Patient needs continued PT services  PT Problem List Decreased strength;Decreased activity tolerance;Decreased balance       PT Treatment Interventions DME instruction;Functional mobility training;Balance training;Patient/family education;Gait training;Stair training;Therapeutic exercise    PT Goals (Current goals can be found in the Care Plan section)  Acute Rehab PT Goals Patient Stated Goal: safely enter home PT Goal Formulation: With patient Time For Goal Achievement: 10/22/23 Potential to Achieve Goals: Good    Frequency Min 2X/week     Co-evaluation               AM-PAC PT "6 Clicks" Mobility  Outcome Measure Help needed turning from your back to your side while in a flat bed without using bedrails?: None Help needed moving from lying on your back to sitting on the side of a flat bed without using bedrails?: A Little Help needed moving to and from a bed to a chair (including a wheelchair)?: A Little Help needed standing up from a chair using your arms (e.g., wheelchair or bedside chair)?: None Help needed to walk in hospital room?: A Little Help needed climbing 3-5 steps with a railing? : A Little 6 Click Score: 20    End of Session Equipment Utilized During Treatment: Gait belt Activity Tolerance: Patient tolerated treatment well Patient left: in bed;with bed alarm set;with call bell/phone within reach Nurse Communication: Mobility status PT Visit Diagnosis: Unsteadiness on feet (R26.81);Difficulty  in walking, not elsewhere classified (R26.2)    Time: 1610-9604 PT Time Calculation (min) (ACUTE ONLY): 23 min   Charges:   PT Evaluation $PT Eval Low Complexity: 1 Low PT Treatments $Gait Training: 8-22 mins PT General Charges $$ ACUTE PT VISIT: 1 Visit         Darien Eden, PT Acute Rehabilitation Services Office: [phone] 10/08/2023   Serafin Dames 10/08/2023, 10:20 AM

## 2023-10-08 NOTE — Discharge Instructions (Signed)
Follow with PCP in 5-7 days  Please get a complete blood count and chemistry panel checked by your Primary MD at your next visit, and again as instructed by your Primary MD. Please get your medications reviewed and adjusted by your Primary MD.  Please request your Primary MD to go over all Hospital Tests and Procedure/Radiological results at the follow up, please get all Hospital records sent to your Prim MD by signing hospital release before you go home.  In some cases, there will be blood work, cultures and biopsy results pending at the time of your discharge. Please request that your primary care M.D. goes through all the records of your hospital data and follows up on these results.  If you had Pneumonia of Lung problems at the Hospital: Please get a 2 view Chest X ray done in 6-8 weeks after hospital discharge or sooner if instructed by your Primary MD.  If you have Congestive Heart Failure: Please call your Cardiologist or Primary MD anytime you have any of the following symptoms:  1) 3 pound weight gain in 24 hours or 5 pounds in 1 week  2) shortness of breath, with or without a dry hacking cough  3) swelling in the hands, feet or stomach  4) if you have to sleep on extra pillows at night in order to breathe  Follow cardiac low salt diet and 1.5 lit/day fluid restriction.  If you have diabetes Accuchecks 4 times/day, Once in AM empty stomach and then before each meal. Log in all results and show them to your primary doctor at your next visit. If any glucose reading is under 80 or above 300 call your primary MD immediately.  If you have Seizure/Convulsions/Epilepsy: Please do not drive, operate heavy machinery, participate in activities at heights or participate in high speed sports until you have seen by Primary MD or a Neurologist and advised to do so again. Per [place] DMV statutes, patients with seizures are not allowed to drive until they have been seizure-free for six  months.  Use caution when using heavy equipment or power tools. Avoid working on ladders or at heights. Take showers instead of baths. Ensure the water temperature is not too high on the home water heater. Do not go swimming alone. Do not lock yourself in a room alone (i.e. bathroom). When caring for infants or small children, sit down when holding, feeding, or changing them to minimize risk of injury to the child in the event you have a seizure. Maintain good sleep hygiene. Avoid alcohol.   If you had Gastrointestinal Bleeding: Please ask your Primary MD to check a complete blood count within one week of discharge or at your next visit. Your endoscopic/colonoscopic biopsies that are pending at the time of discharge, will also need to followed by your Primary MD.  Get Medicines reviewed and adjusted. Please take all your medications with you for your next visit with your Primary MD  Please request your Primary MD to go over all hospital tests and procedure/radiological results at the follow up, please ask your Primary MD to get all Hospital records sent to his/her office.  If you experience worsening of your admission symptoms, develop shortness of breath, life threatening emergency, suicidal or homicidal thoughts you must seek medical attention immediately by calling 911 or calling your MD immediately  if symptoms less severe.  You must read complete instructions/literature along with all the possible adverse reactions/side effects for all the Medicines you take and that   have been prescribed to you. Take any new Medicines after you have completely understood and accpet all the possible adverse reactions/side effects.   Do not drive or operate heavy machinery when taking Pain medications.   Do not take more than prescribed Pain, Sleep and Anxiety Medications  Special Instructions: If you have smoked or chewed Tobacco  in the last 2 yrs please stop smoking, stop any regular Alcohol  and or any  Recreational drug use.  Wear Seat belts while driving.  Please note You were cared for by a hospitalist during your hospital stay. If you have any questions about your discharge medications or the care you received while you were in the hospital after you are discharged, you can call the unit and asked to speak with the hospitalist on call if the hospitalist that took care of you is not available. Once you are discharged, your primary care physician will handle any further medical issues. Please note that NO REFILLS for any discharge medications will be authorized once you are discharged, as it is imperative that you return to your primary care physician (or establish a relationship with a primary care physician if you do not have one) for your aftercare needs so that they can reassess your need for medications and monitor your lab values.  You can reach the hospitalist office at phone [phone] or fax [phone]   If you do not have a primary care physician, you can call [phone] for a physician referral.  Activity: As tolerated with Full fall precautions use walker/cane & assistance as needed    Diet: regular  Disposition Home  

## 2023-10-08 NOTE — TOC Transition Note (Signed)
 Transition of Care [facility]) - Discharge Note   Patient Details  Name: Taylor Benitez MRN: [id number] Date of Birth: 1960/11/14  Transition of Care [facility]) CM/SW Contact:  Amaryllis Junior, LCSW Phone Number: 10/08/2023, 1:52 PM   Clinical Narrative:    Pt medically ready to d/c home. Pt recommended youth RW, DME ordered through [employer]. HHPT set up w/ [facility]. HHPT information added to AVS. No further TOC needs.   Final next level of care: Home w Home Health Services Barriers to Discharge: Barriers Resolved   Patient Goals and CMS Choice Patient states their goals for this hospitalization and ongoing recovery are:: return home CMS Medicare.gov Compare Post Acute Care list provided to::  (NA) Choice offered to / list presented to : NA [place] ownership interest in [facility].provided to::  (NA)    Discharge Placement                       Discharge Plan and Services Additional resources added to the After Visit Summary for                  DME Arranged: Walker youth DME Agency: [employer] Date DME Agency Contacted: 10/08/23 Time DME Agency Contacted: 1351 Representative spoke with at DME Agency: Zula Hitch HH Arranged: PT HH Agency: [facility] Date [facility] Agency Contacted: 10/08/23 Time HH Agency Contacted: 1352 Representative spoke with at [facility] Agency: Cindie  Social Drivers of Health (SDOH) Interventions SDOH Screenings   Food Insecurity: No Food Insecurity (10/06/2023)  Housing: Low Risk  (10/06/2023)  Transportation Needs: No Transportation Needs (10/06/2023)  Utilities: Not At Risk (10/06/2023)  Depression (PHQ2-9): Low Risk  (03/28/2019)  Social Connections: Unknown (10/06/2023)  Tobacco Use: Medium Risk (10/06/2023)     Readmission Risk Interventions    10/07/2023    2:13 PM  Readmission Risk Prevention Plan  Transportation Screening Complete  PCP or Specialist Appt within 5-7 Days Complete  Home Care Screening Complete   Medication Review (RN CM) Complete

## 2023-10-08 NOTE — Progress Notes (Signed)
 AVS reviewed w/ pt who verbalized an understanding. PIV removed as noted. Pt dressing for d/c to home.

## 2023-10-08 NOTE — Progress Notes (Signed)
 Pt's friend is unable to pick her up - taxi voucher obtained from [facility]. [employer] cab called by this [employer] fundraiser

## 2023-12-10 ENCOUNTER — Other Ambulatory Visit: Payer: Self-pay | Admitting: Family

## 2023-12-10 DIAGNOSIS — K7460 Unspecified cirrhosis of liver: Secondary | ICD-10-CM
